# Patient Record
Sex: Female | Born: 1980 | Hispanic: No | Smoking: Former smoker
Health system: Southern US, Community
[De-identification: ages and names within clinical notes are randomized; demographics above are authoritative.]

## PROBLEM LIST (undated history)

## (undated) ENCOUNTER — Encounter: Attending: Obstetrics & Gynecology | Primary: Obstetrics & Gynecology

## (undated) ENCOUNTER — Encounter

## (undated) ENCOUNTER — Ambulatory Visit: Payer: Medicaid (Managed Care)

## (undated) ENCOUNTER — Ambulatory Visit

## (undated) ENCOUNTER — Telehealth

## (undated) ENCOUNTER — Ambulatory Visit
Payer: PRIVATE HEALTH INSURANCE | Attending: Student in an Organized Health Care Education/Training Program | Primary: Student in an Organized Health Care Education/Training Program

## (undated) ENCOUNTER — Telehealth
Attending: Student in an Organized Health Care Education/Training Program | Primary: Student in an Organized Health Care Education/Training Program

## (undated) ENCOUNTER — Encounter: Attending: Anesthesiology | Primary: Anesthesiology

## (undated) ENCOUNTER — Encounter: Attending: Dermatology | Primary: Dermatology

## (undated) ENCOUNTER — Ambulatory Visit: Payer: PRIVATE HEALTH INSURANCE

## (undated) ENCOUNTER — Encounter
Attending: Student in an Organized Health Care Education/Training Program | Primary: Student in an Organized Health Care Education/Training Program

## (undated) ENCOUNTER — Encounter: Payer: PRIVATE HEALTH INSURANCE | Attending: Dermatology | Primary: Dermatology

## (undated) ENCOUNTER — Encounter: Attending: Adult Health | Primary: Adult Health

## (undated) ENCOUNTER — Ambulatory Visit: Payer: PRIVATE HEALTH INSURANCE | Attending: Anesthesiology | Primary: Anesthesiology

## (undated) ENCOUNTER — Ambulatory Visit: Payer: PRIVATE HEALTH INSURANCE | Attending: Registered Nurse | Primary: Registered Nurse

## (undated) ENCOUNTER — Ambulatory Visit
Attending: Student in an Organized Health Care Education/Training Program | Primary: Student in an Organized Health Care Education/Training Program

## (undated) ENCOUNTER — Inpatient Hospital Stay

## (undated) ENCOUNTER — Ambulatory Visit: Payer: PRIVATE HEALTH INSURANCE | Attending: "Women's Health Care | Primary: "Women's Health Care

## (undated) ENCOUNTER — Encounter: Payer: PRIVATE HEALTH INSURANCE | Attending: Anesthesiology | Primary: Anesthesiology

## (undated) ENCOUNTER — Ambulatory Visit
Payer: PRIVATE HEALTH INSURANCE | Attending: Physical Medicine & Rehabilitation | Primary: Physical Medicine & Rehabilitation

## (undated) ENCOUNTER — Encounter: Attending: "Women's Health Care | Primary: "Women's Health Care

## (undated) ENCOUNTER — Telehealth: Attending: Dermatology | Primary: Dermatology

## (undated) ENCOUNTER — Telehealth: Attending: Obstetrics & Gynecology | Primary: Obstetrics & Gynecology

## (undated) ENCOUNTER — Encounter: Attending: Registered" | Primary: Registered"

## (undated) ENCOUNTER — Ambulatory Visit: Attending: Internal Medicine | Primary: Internal Medicine

## (undated) ENCOUNTER — Ambulatory Visit
Payer: Medicaid (Managed Care) | Attending: Student in an Organized Health Care Education/Training Program | Primary: Student in an Organized Health Care Education/Training Program

## (undated) ENCOUNTER — Ambulatory Visit: Payer: PRIVATE HEALTH INSURANCE | Attending: Clinical | Primary: Clinical

## (undated) ENCOUNTER — Encounter
Payer: PRIVATE HEALTH INSURANCE | Attending: Rehabilitative and Restorative Service Providers" | Primary: Rehabilitative and Restorative Service Providers"

## (undated) ENCOUNTER — Non-Acute Institutional Stay: Payer: PRIVATE HEALTH INSURANCE | Attending: Dermatology | Primary: Dermatology

## (undated) ENCOUNTER — Encounter: Attending: Diagnostic Radiology | Primary: Diagnostic Radiology

## (undated) ENCOUNTER — Ambulatory Visit
Attending: Rehabilitative and Restorative Service Providers" | Primary: Rehabilitative and Restorative Service Providers"

## (undated) ENCOUNTER — Encounter: Attending: Physician Assistant | Primary: Physician Assistant

## (undated) ENCOUNTER — Encounter: Attending: Hematology | Primary: Hematology

## (undated) ENCOUNTER — Ambulatory Visit: Payer: PRIVATE HEALTH INSURANCE | Attending: Obstetrics & Gynecology | Primary: Obstetrics & Gynecology

## (undated) ENCOUNTER — Encounter: Attending: Pharmacist | Primary: Pharmacist

## (undated) ENCOUNTER — Telehealth: Attending: Nephrology | Primary: Nephrology

## (undated) ENCOUNTER — Ambulatory Visit: Attending: Pharmacist | Primary: Pharmacist

## (undated) ENCOUNTER — Encounter: Payer: PRIVATE HEALTH INSURANCE | Attending: Registered" | Primary: Registered"

## (undated) DIAGNOSIS — L732 Hidradenitis suppurativa: Secondary | ICD-10-CM

## (undated) DIAGNOSIS — G9332 Myalgic encephalomyelitis/chronic fatigue syndrome: Secondary | ICD-10-CM

## (undated) DIAGNOSIS — M1990 Unspecified osteoarthritis, unspecified site: Secondary | ICD-10-CM

## (undated) DIAGNOSIS — M797 Fibromyalgia: Secondary | ICD-10-CM

## (undated) DIAGNOSIS — G4721 Circadian rhythm sleep disorder, delayed sleep phase type: Secondary | ICD-10-CM

## (undated) DIAGNOSIS — G8929 Other chronic pain: Secondary | ICD-10-CM

## (undated) DIAGNOSIS — R5382 Chronic fatigue, unspecified: Secondary | ICD-10-CM

## (undated) HISTORY — DX: Chronic fatigue, unspecified: R53.82

## (undated) HISTORY — PX: CHOLECYSTECTOMY: SHX55

## (undated) HISTORY — DX: Circadian rhythm sleep disorder, delayed sleep phase type: G47.21

## (undated) HISTORY — DX: Hidradenitis suppurativa: L73.2

## (undated) HISTORY — DX: Fibromyalgia: M79.7

## (undated) HISTORY — DX: Other chronic pain: G89.29

## (undated) HISTORY — PX: TONSILLECTOMY: SUR1361

## (undated) HISTORY — DX: Myalgic encephalomyelitis/chronic fatigue syndrome: G93.32

## (undated) MED ORDER — AZELASTINE-FLUTICASONE 137 MCG-50 MCG/SPRAY NASAL SPRAY
NASAL | 0.00000 days
Start: ? — End: 2020-12-22

## (undated) MED ORDER — TRIAMCINOLONE ACETONIDE 0.1 % TOPICAL OINTMENT
0.00000 days
Start: ? — End: 2020-12-22

## (undated) MED ORDER — MELATONIN 5 MG CAPSULE: ORAL | 0.00000 days

## (undated) MED ORDER — MULTIVITAMIN TABLET
Freq: Every day | ORAL | 0.00000 days
Start: ? — End: 2020-12-22

## (undated) MED ORDER — TRULICITY 0.75 MG/0.5 ML SUBCUTANEOUS PEN INJECTOR
0.00000 days
Start: ? — End: 2020-12-22

---
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
## 2017-09-09 DIAGNOSIS — J329 Chronic sinusitis, unspecified: Principal | ICD-10-CM

## 2017-09-16 ENCOUNTER — Ambulatory Visit
Admission: RE | Admit: 2017-09-16 | Discharge: 2017-09-16 | Disposition: A | Discharge status: Home / Self Care | Payer: MEDICAID | Attending: Otolaryngology

## 2017-09-16 ENCOUNTER — Ambulatory Visit
Admission: RE | Admit: 2017-09-16 | Discharge: 2017-09-16 | Disposition: A | Discharge status: Home / Self Care | Payer: MEDICAID | Attending: Anesthesiology | Admitting: Anesthesiology

## 2017-09-16 DIAGNOSIS — J329 Chronic sinusitis, unspecified: Principal | ICD-10-CM

## 2018-06-01 ENCOUNTER — Ambulatory Visit
Admit: 2018-06-01 | Discharge: 2018-06-02 | Payer: PRIVATE HEALTH INSURANCE | Attending: Anesthesiology | Primary: Anesthesiology

## 2018-06-01 DIAGNOSIS — M5442 Lumbago with sciatica, left side: Secondary | ICD-10-CM

## 2018-06-01 DIAGNOSIS — G8929 Other chronic pain: Secondary | ICD-10-CM

## 2018-06-01 DIAGNOSIS — M5441 Lumbago with sciatica, right side: Principal | ICD-10-CM

## 2018-06-01 MED ORDER — CELECOXIB 200 MG CAPSULE
ORAL_CAPSULE | Freq: Two times a day (BID) | ORAL | 1 refills | 0 days | Status: CP
Start: 2018-06-01 — End: 2018-07-06

## 2018-06-01 MED ORDER — PREGABALIN 50 MG CAPSULE
ORAL_CAPSULE | Freq: Two times a day (BID) | ORAL | 1 refills | 0 days | Status: CP
Start: 2018-06-01 — End: 2018-09-26

## 2018-07-06 ENCOUNTER — Encounter
Admit: 2018-07-06 | Discharge: 2018-07-07 | Payer: PRIVATE HEALTH INSURANCE | Attending: Anesthesiology | Primary: Anesthesiology

## 2018-07-06 DIAGNOSIS — M5442 Lumbago with sciatica, left side: Secondary | ICD-10-CM

## 2018-07-06 DIAGNOSIS — M7918 Myalgia, other site: Secondary | ICD-10-CM

## 2018-07-06 DIAGNOSIS — G8929 Other chronic pain: Secondary | ICD-10-CM

## 2018-07-06 DIAGNOSIS — M5441 Lumbago with sciatica, right side: Principal | ICD-10-CM

## 2018-07-06 MED ORDER — CELECOXIB 200 MG CAPSULE
ORAL_CAPSULE | Freq: Two times a day (BID) | ORAL | 2 refills | 0.00000 days | Status: CP
Start: 2018-07-06 — End: 2018-08-17

## 2018-08-16 ENCOUNTER — Encounter
Admit: 2018-08-16 | Discharge: 2018-08-16 | Payer: PRIVATE HEALTH INSURANCE | Attending: Physical Medicine & Rehabilitation | Primary: Physical Medicine & Rehabilitation

## 2018-08-16 ENCOUNTER — Encounter
Admit: 2018-08-16 | Discharge: 2018-08-16 | Payer: PRIVATE HEALTH INSURANCE | Attending: Registered" | Primary: Registered"

## 2018-08-16 DIAGNOSIS — M5442 Lumbago with sciatica, left side: Secondary | ICD-10-CM

## 2018-08-16 DIAGNOSIS — Z6841 Body Mass Index (BMI) 40.0 and over, adult: Secondary | ICD-10-CM

## 2018-08-16 DIAGNOSIS — M5441 Lumbago with sciatica, right side: Principal | ICD-10-CM

## 2018-08-16 DIAGNOSIS — G8929 Other chronic pain: Secondary | ICD-10-CM

## 2018-08-16 DIAGNOSIS — R7303 Prediabetes: Secondary | ICD-10-CM

## 2018-08-16 MED ORDER — METFORMIN 500 MG TABLET
ORAL_TABLET | Freq: Two times a day (BID) | ORAL | 1 refills | 0.00000 days | Status: CP
Start: 2018-08-16 — End: 2018-12-21

## 2018-08-17 ENCOUNTER — Encounter
Admit: 2018-08-17 | Discharge: 2018-08-18 | Payer: PRIVATE HEALTH INSURANCE | Attending: Anesthesiology | Primary: Anesthesiology

## 2018-08-17 DIAGNOSIS — M5442 Lumbago with sciatica, left side: Secondary | ICD-10-CM

## 2018-08-17 DIAGNOSIS — M5441 Lumbago with sciatica, right side: Principal | ICD-10-CM

## 2018-08-17 DIAGNOSIS — G8929 Other chronic pain: Secondary | ICD-10-CM

## 2018-08-17 MED ORDER — CELECOXIB 200 MG CAPSULE
ORAL_CAPSULE | Freq: Two times a day (BID) | ORAL | 2 refills | 0.00000 days | Status: CP
Start: 2018-08-17 — End: 2018-09-26

## 2018-09-13 ENCOUNTER — Encounter: Admit: 2018-09-13 | Discharge: 2018-09-14 | Payer: PRIVATE HEALTH INSURANCE

## 2018-09-13 DIAGNOSIS — M5441 Lumbago with sciatica, right side: Principal | ICD-10-CM

## 2018-09-13 DIAGNOSIS — M5442 Lumbago with sciatica, left side: Secondary | ICD-10-CM

## 2018-09-13 DIAGNOSIS — G8929 Other chronic pain: Secondary | ICD-10-CM

## 2018-09-19 ENCOUNTER — Emergency Department
Admission: EM | Admit: 2018-09-19 | Discharge: 2018-09-19 | Disposition: A | Discharge status: Home / Self Care | Payer: Medicaid Other | Attending: Emergency Medicine | Admitting: Emergency Medicine

## 2018-09-19 ENCOUNTER — Encounter: Payer: Self-pay | Admitting: Emergency Medicine

## 2018-09-19 ENCOUNTER — Other Ambulatory Visit: Payer: Self-pay

## 2018-09-19 ENCOUNTER — Emergency Department: Payer: Medicaid Other

## 2018-09-19 DIAGNOSIS — S40012A Contusion of left shoulder, initial encounter: Secondary | ICD-10-CM | POA: Diagnosis not present

## 2018-09-19 DIAGNOSIS — Y999 Unspecified external cause status: Secondary | ICD-10-CM | POA: Diagnosis not present

## 2018-09-19 DIAGNOSIS — Y92410 Unspecified street and highway as the place of occurrence of the external cause: Secondary | ICD-10-CM | POA: Diagnosis not present

## 2018-09-19 DIAGNOSIS — S4992XA Unspecified injury of left shoulder and upper arm, initial encounter: Secondary | ICD-10-CM | POA: Diagnosis present

## 2018-09-19 DIAGNOSIS — Y939 Activity, unspecified: Secondary | ICD-10-CM | POA: Insufficient documentation

## 2018-09-19 MED ORDER — CYCLOBENZAPRINE HCL 10 MG PO TABS: 10.0000 mg | ORAL_TABLET | Freq: Three times a day (TID) | ORAL | 0 refills | Status: AC | PRN

## 2018-09-19 MED ORDER — NAPROXEN 500 MG PO TABS: 500.0000 mg | ORAL_TABLET | Freq: Two times a day (BID) | ORAL | 0 refills | Status: AC
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
## 2018-09-19 NOTE — Discharge Instructions (Signed)
Follow-up with the primary care provider for symptoms are not improving over the week  Return to the emergency department for symptoms of change or worsen if you are unable to schedule an appointment.
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
## 2018-09-26 ENCOUNTER — Encounter
Admit: 2018-09-26 | Discharge: 2018-09-27 | Payer: PRIVATE HEALTH INSURANCE | Attending: Anesthesiology | Primary: Anesthesiology

## 2018-09-26 DIAGNOSIS — M5442 Lumbago with sciatica, left side: Secondary | ICD-10-CM

## 2018-09-26 DIAGNOSIS — M5441 Lumbago with sciatica, right side: Principal | ICD-10-CM

## 2018-09-26 DIAGNOSIS — G8929 Other chronic pain: Secondary | ICD-10-CM

## 2018-09-26 MED ORDER — PREGABALIN 25 MG CAPSULE
ORAL_CAPSULE | Freq: Three times a day (TID) | ORAL | 2 refills | 0.00000 days | Status: CP
Start: 2018-09-26 — End: 2019-03-04

## 2018-09-26 MED ORDER — CELECOXIB 200 MG CAPSULE
ORAL_CAPSULE | Freq: Two times a day (BID) | ORAL | 2 refills | 0.00000 days | Status: CP
Start: 2018-09-26 — End: 2018-11-06

## 2018-10-19 ENCOUNTER — Emergency Department
Admission: EM | Admit: 2018-10-19 | Discharge: 2018-10-19 | Disposition: A | Discharge status: Home / Self Care | Payer: Medicaid Other | Attending: Emergency Medicine | Admitting: Emergency Medicine

## 2018-10-19 ENCOUNTER — Other Ambulatory Visit: Payer: Self-pay

## 2018-10-19 ENCOUNTER — Encounter: Payer: Self-pay | Admitting: Emergency Medicine

## 2018-10-19 DIAGNOSIS — R070 Pain in throat: Secondary | ICD-10-CM | POA: Diagnosis not present

## 2018-10-19 DIAGNOSIS — R0981 Nasal congestion: Secondary | ICD-10-CM | POA: Insufficient documentation

## 2018-10-19 DIAGNOSIS — J069 Acute upper respiratory infection, unspecified: Secondary | ICD-10-CM | POA: Diagnosis not present

## 2018-10-19 DIAGNOSIS — F17200 Nicotine dependence, unspecified, uncomplicated: Secondary | ICD-10-CM | POA: Insufficient documentation

## 2018-10-19 DIAGNOSIS — R05 Cough: Secondary | ICD-10-CM | POA: Diagnosis present

## 2018-10-19 HISTORY — DX: Unspecified osteoarthritis, unspecified site: M19.90

## 2018-10-19 MED ORDER — OXYMETAZOLINE HCL 0.05 % NA SOLN
1.0000 | Freq: Once | NASAL | Status: DC
  Filled 2018-10-19: qty 15

## 2018-10-19 MED ORDER — PREDNISONE 20 MG PO TABS: 20.0000 mg | ORAL_TABLET | Freq: Every day | ORAL | 0 refills | Status: AC

## 2018-10-19 MED ORDER — BENZONATATE 100 MG PO CAPS: 100.0000 mg | ORAL_CAPSULE | Freq: Four times a day (QID) | ORAL | 0 refills | Status: AC | PRN

## 2018-10-19 MED ORDER — IPRATROPIUM BROMIDE 0.06 % NA SOLN: 2.0000 | Freq: Three times a day (TID) | NASAL | 0 refills | Status: AC

## 2018-10-19 MED ORDER — PREDNISONE 20 MG PO TABS: 20.0000 mg | ORAL_TABLET | Freq: Once | ORAL | Status: DC

## 2018-10-19 MED ORDER — PSEUDOEPHEDRINE HCL 30 MG PO TABS
60.0000 mg | ORAL_TABLET | Freq: Once | ORAL | Status: DC
  Filled 2018-10-19: qty 2

## 2018-10-19 MED ORDER — BENZONATATE 100 MG PO CAPS
200.0000 mg | ORAL_CAPSULE | Freq: Once | ORAL | Status: AC
  Administered 2018-10-19: 200 mg via ORAL
  Filled 2018-10-19: qty 2

## 2018-10-19 MED ORDER — IBUPROFEN 600 MG PO TABS
600.0000 mg | ORAL_TABLET | Freq: Once | ORAL | Status: DC
  Filled 2018-10-19: qty 1

## 2018-10-19 NOTE — ED Provider Notes (Signed)
[facility] Emergency Department Provider Note  ____________________________________________   First MD Initiated Contact with Patient 10/19/18 [id number]     (approximate)  I have reviewed the triage vital signs and the nursing notes.   HISTORY  Chief Complaint Sore Throat and Cough   HPI Jenny Diaz is a 37 y.o. female presents to the emergency department with 2 days of insidious onset slowly progressive now moderate severity dry cough, sore throat, and nasal congestion.  Denies fevers or chills.  No shortness of breath.  She has no history of asthma or COPD.  She does report some fullness in both of her ears.    Past Medical History:  Diagnosis Date  . Arthritis     There are no active problems to display for this patient.   Past Surgical History:  Procedure Laterality Date  . CHOLECYSTECTOMY    . TONSILLECTOMY      Prior to Admission medications   Medication Sig Start Date End Date Taking? Authorizing Provider  benzonatate (TESSALON PERLES) 100 MG capsule Take 1 capsule (100 mg total) by mouth every 6 (six) hours as needed for cough. 10/19/18 10/19/19  Merrily Brittleifenbark, Lilli Dewald, MD  cyclobenzaprine (FLEXERIL) 10 MG tablet Take 1 tablet (10 mg total) by mouth 3 (three) times daily as needed for muscle spasms. 09/19/18   Triplett, Cari B, FNP  ipratropium (ATROVENT) 0.06 % nasal spray Place 2 sprays into the nose 3 (three) times daily. 10/19/18 10/19/19  Merrily Brittleifenbark, Tarance Balan, MD  naproxen (NAPROSYN) 500 MG tablet Take 1 tablet (500 mg total) by mouth 2 (two) times daily with a meal. 09/19/18   Triplett, Cari B, FNP  predniSONE (DELTASONE) 20 MG tablet Take 1 tablet (20 mg total) by mouth daily for 4 days. 10/19/18 10/23/18  Merrily Brittleifenbark, Marissa Lowrey, MD    Allergies Ceclor [cefaclor] and Amoxicillin-pot clavulanate  No family history on file.  Social History Social History   Tobacco Use  . Smoking status: Current Some Day Smoker  . Smokeless tobacco: Never Used    Substance Use Topics  . Alcohol use: Yes  . Drug use: Never    Review of Systems Constitutional: No fever/chills ENT: Positive for sore throat. Cardiovascular: Denies chest pain. Respiratory: Positive for cough Gastrointestinal: No abdominal pain.  No nausea, no vomiting.  No diarrhea.  No constipation. Musculoskeletal: Negative for back pain. Neurological: Observe for headache   ____________________________________________   PHYSICAL EXAM:  VITAL SIGNS: ED Triage Vitals  Enc Vitals Group     BP 10/19/18 0412 137/90     Pulse Rate 10/19/18 0412 78     Resp 10/19/18 0412 16     Temp 10/19/18 0412 97.8 F (36.6 C)     Temp Source 10/19/18 0412 Oral     SpO2 10/19/18 0412 100 %     Weight 10/19/18 0406 (!) 345 lb (156.5 kg)     Height 10/19/18 0406 5\' 6"  (1.676 m)     Head Circumference --      Peak Flow --      Pain Score 10/19/18 0405 7     Pain Loc --      Pain Edu? --      Excl. in GC? --     Constitutional: Alert and oriented x4 dry cough and clearing her throat frequently during exam Head: Atraumatic.  Some fluid behind both of her ears although no erythema no bulging Nose: Quite congested Mouth/Throat: No trismus uvula midline no pharyngeal erythema or exudate.  No lymphadenopathy  appreciated Neck: No stridor.   Cardiovascular: Regular rate and rhythm Respiratory: Slightly increased respiratory effort although lungs are clear bilaterally and moving good air Gastrointestinal: Soft nontender Neurologic:  Normal speech and language. No gross focal neurologic deficits are appreciated.  Skin:  Skin is warm, dry and intact. No rash noted.    ____________________________________________  LABS (all labs ordered are listed, but only abnormal results are displayed)  Labs Reviewed - No data to  display   __________________________________________  EKG   ____________________________________________  RADIOLOGY   ____________________________________________   DIFFERENTIAL includes but not limited to  Upper respiratory tract infection, influenza, pneumonia, strep pharyngitis, sinusitis   PROCEDURES  Procedure(s) performed: no  Procedures  Critical Care performed: no  ____________________________________________   INITIAL IMPRESSION / ASSESSMENT AND PLAN / ED COURSE  Pertinent labs & imaging results that were available during my care of the patient were reviewed by me and considered in my medical decision making (see chart for details).   As part of my medical decision making, I reviewed the following data within the electronic MEDICAL RECORD NUMBER History obtained from family if available, nursing notes, old chart and ekg, as well as notes from prior ED visits.  The patient has a dry cough along with significant congestion and fluid behind both of her ears.  Here in the emergency department she was given Afrin, ibuprofen, Tylenol, and a nebulized lidocaine with significant improvement of her symptoms.  I will discharge her home with Atrovent nasal spray as well as Tessalon Perles and a short course of prednisone at the patient's request.  Strict return precautions have been given.      ____________________________________________   FINAL CLINICAL IMPRESSION(S) / ED DIAGNOSES  Final diagnoses:  Upper respiratory tract infection, unspecified type  Sinus congestion      NEW MEDICATIONS STARTED DURING THIS VISIT:  Discharge Medication List as of 10/19/2018  4:29 AM    START taking these medications   Details  benzonatate (TESSALON PERLES) 100 MG capsule Take 1 capsule (100 mg total) by mouth every 6 (six) hours as needed for cough., Starting Fri 10/19/2018, Until Sat 10/19/2019, Print    ipratropium (ATROVENT) 0.06 % nasal spray Place 2 sprays into the  nose 3 (three) times daily., Starting Fri 10/19/2018, Until Sat 10/19/2019, Print         Note:  This document was prepared using Dragon voice recognition software and may include unintentional dictation errors.      Merrily Brittle, MD 10/21/18 1007

## 2018-10-19 NOTE — Discharge Instructions (Signed)
It is normal for your head cold to last up to a full 4-5 days.  Please use over the counter sinus rinses (like a neti pot) to help flush out all of your mucous.  Use your cough medication as needed for severe symptoms and follow up with your PMD as needed.  Fortunately today your lungs were very normal and you do not have bronchitis or pneumonia.  It was a pleasure to take care of you today, and thank you for coming to our emergency department.  If you have any questions or concerns before leaving please ask the nurse to grab me and I'm more than happy to go through your aftercare instructions again.  If you have any concerns once you are home that you are not improving or are in fact getting worse before you can make it to your follow-up appointment, please do not hesitate to call 911 and come back for further evaluation.  Merrily BrittleNeil Magaby Rumberger, MD

## 2018-10-19 NOTE — ED Notes (Signed)
Pt refusing all meds, states she wants prednisone and that they usually give her steroids when " I can't breathe good". PT in NAD , RR even and unlabored, speaking in full sentences.

## 2018-10-19 NOTE — ED Triage Notes (Signed)
Pt with upper respiratory symptoms, cough, sore throat x 2 days. Denies fever. Clearing throat often in triage.

## 2018-11-22 ENCOUNTER — Encounter
Admit: 2018-11-22 | Discharge: 2018-11-23 | Payer: PRIVATE HEALTH INSURANCE | Attending: Nurse Practitioner | Primary: Nurse Practitioner

## 2018-11-22 ENCOUNTER — Ambulatory Visit
Admission: EM | Admit: 2018-11-22 | Discharge: 2018-11-22 | Discharge status: Against Medical Advice | Payer: Medicaid Other

## 2018-11-22 DIAGNOSIS — H9201 Otalgia, right ear: Principal | ICD-10-CM

## 2018-12-21 ENCOUNTER — Ambulatory Visit: Admit: 2018-12-21 | Discharge: 2018-12-22 | Payer: PRIVATE HEALTH INSURANCE

## 2018-12-21 DIAGNOSIS — J309 Allergic rhinitis, unspecified: Principal | ICD-10-CM

## 2018-12-21 DIAGNOSIS — R7303 Prediabetes: Secondary | ICD-10-CM

## 2018-12-21 DIAGNOSIS — G894 Chronic pain syndrome: Secondary | ICD-10-CM

## 2018-12-21 DIAGNOSIS — E559 Vitamin D deficiency, unspecified: Secondary | ICD-10-CM

## 2018-12-21 DIAGNOSIS — D72829 Elevated white blood cell count, unspecified: Secondary | ICD-10-CM

## 2018-12-21 DIAGNOSIS — J4520 Mild intermittent asthma, uncomplicated: Secondary | ICD-10-CM

## 2018-12-21 DIAGNOSIS — F419 Anxiety disorder, unspecified: Secondary | ICD-10-CM

## 2018-12-27 ENCOUNTER — Encounter: Admit: 2018-12-27 | Discharge: 2018-12-28 | Payer: PRIVATE HEALTH INSURANCE

## 2018-12-27 DIAGNOSIS — D72829 Elevated white blood cell count, unspecified: Principal | ICD-10-CM

## 2018-12-27 DIAGNOSIS — E559 Vitamin D deficiency, unspecified: Secondary | ICD-10-CM

## 2018-12-27 DIAGNOSIS — R7303 Prediabetes: Secondary | ICD-10-CM

## 2019-03-04 ENCOUNTER — Encounter
Admit: 2019-03-04 | Discharge: 2019-03-05 | Payer: PRIVATE HEALTH INSURANCE | Attending: Anesthesiology | Primary: Anesthesiology

## 2019-03-04 DIAGNOSIS — M5441 Lumbago with sciatica, right side: Principal | ICD-10-CM

## 2019-03-04 DIAGNOSIS — M5442 Lumbago with sciatica, left side: Secondary | ICD-10-CM

## 2019-03-04 DIAGNOSIS — M7918 Myalgia, other site: Secondary | ICD-10-CM

## 2019-03-04 DIAGNOSIS — G8929 Other chronic pain: Secondary | ICD-10-CM

## 2019-03-04 MED ORDER — GABAPENTIN 100 MG CAPSULE
ORAL_CAPSULE | Freq: Every day | ORAL | 0 refills | 0 days | Status: CP
Start: 2019-03-04 — End: 2019-04-17

## 2019-03-04 MED ORDER — BACLOFEN 10 MG TABLET
ORAL_TABLET | Freq: Every day | ORAL | 0 refills | 0.00000 days | Status: CP | PRN
Start: 2019-03-04 — End: 2019-04-03

## 2019-03-07 MED ORDER — PERMETHRIN 5 % TOPICAL CREAM
Freq: Once | TOPICAL | 0 refills | 0 days | Status: CP
Start: 2019-03-07 — End: 2019-03-07

## 2019-03-13 MED ORDER — SPINOSAD 0.9 % TOPICAL SUSPENSION
1 refills | 0 days | Status: CP
Start: 2019-03-13 — End: 2019-04-11

## 2019-03-27 ENCOUNTER — Encounter
Admit: 2019-03-27 | Discharge: 2019-03-28 | Payer: PRIVATE HEALTH INSURANCE | Attending: Dermatology | Primary: Dermatology

## 2019-03-27 DIAGNOSIS — R21 Rash and other nonspecific skin eruption: Principal | ICD-10-CM

## 2019-03-27 DIAGNOSIS — L732 Hidradenitis suppurativa: Secondary | ICD-10-CM

## 2019-03-27 DIAGNOSIS — L209 Atopic dermatitis, unspecified: Secondary | ICD-10-CM

## 2019-03-27 MED ORDER — CLINDAMYCIN 1 % LOTION
5 refills | 0.00000 days | Status: CP
Start: 2019-03-27 — End: 2019-03-28

## 2019-03-27 MED ORDER — DOXYCYCLINE MONOHYDRATE 100 MG TABLET
ORAL_TABLET | 1 refills | 0.00000 days | Status: CP
Start: 2019-03-27 — End: 2019-03-28

## 2019-03-27 MED ORDER — TRIAMCINOLONE ACETONIDE 0.1 % TOPICAL CREAM
INTRAMUSCULAR | 2 refills | 0.00000 days | Status: CP
Start: 2019-03-27 — End: ?

## 2019-03-28 MED ORDER — DOXYCYCLINE MONOHYDRATE 100 MG TABLET
ORAL_TABLET | Freq: Every day | ORAL | 2 refills | 0.00000 days | Status: CP
Start: 2019-03-28 — End: ?

## 2019-03-28 MED ORDER — CLINDAMYCIN PHOSPHATE 1 % TOPICAL SOLUTION
Freq: Every morning | TOPICAL | 5 refills | 0.00000 days | Status: CP
Start: 2019-03-28 — End: 2020-03-27

## 2019-03-28 MED ORDER — CELECOXIB 200 MG CAPSULE
ORAL_CAPSULE | Freq: Two times a day (BID) | ORAL | 1 refills | 0.00000 days | Status: CP
Start: 2019-03-28 — End: 2019-04-27
  Filled 2019-06-15: qty 60, 30d supply, fill #0

## 2019-04-12 MED ORDER — SPINOSAD 0.9 % TOPICAL SUSPENSION
1 refills | 0 days | Status: CP
Start: 2019-04-12 — End: 2020-04-10

## 2019-04-17 ENCOUNTER — Encounter
Admit: 2019-04-17 | Discharge: 2019-04-18 | Payer: PRIVATE HEALTH INSURANCE | Attending: Anesthesiology | Primary: Anesthesiology

## 2019-04-17 DIAGNOSIS — G894 Chronic pain syndrome: Secondary | ICD-10-CM

## 2019-04-17 DIAGNOSIS — M797 Fibromyalgia: Principal | ICD-10-CM

## 2019-04-17 DIAGNOSIS — M545 Low back pain: Secondary | ICD-10-CM

## 2019-04-17 MED ORDER — DULOXETINE 30 MG CAPSULE,DELAYED RELEASE
ORAL_CAPSULE | Freq: Every day | ORAL | 0 refills | 0 days | Status: CP
Start: 2019-04-17 — End: 2019-05-21

## 2019-05-21 MED ORDER — DULOXETINE 20 MG CAPSULE,DELAYED RELEASE
ORAL_CAPSULE | Freq: Every day | ORAL | 1 refills | 0 days | Status: CP
Start: 2019-05-21 — End: 2019-06-10

## 2019-06-10 MED ORDER — DULOXETINE 20 MG CAPSULE,DELAYED RELEASE
ORAL_CAPSULE | Freq: Every day | ORAL | 1 refills | 30 days | Status: CP
Start: 2019-06-10 — End: 2019-06-19
  Filled 2019-06-10: qty 30, 30d supply, fill #0

## 2019-06-10 MED FILL — DULOXETINE 20 MG CAPSULE,DELAYED RELEASE: 30 days supply | Qty: 30 | Fill #0 | Status: AC

## 2019-06-12 MED ORDER — CELECOXIB 200 MG CAPSULE: 200 mg | capsule | Freq: Two times a day (BID) | 1 refills | 30 days | Status: AC

## 2019-06-15 MED FILL — CELECOXIB 200 MG CAPSULE: 30 days supply | Qty: 60 | Fill #0 | Status: AC

## 2019-06-19 ENCOUNTER — Encounter
Admit: 2019-06-19 | Discharge: 2019-06-20 | Payer: PRIVATE HEALTH INSURANCE | Attending: Anesthesiology | Primary: Anesthesiology

## 2019-06-19 DIAGNOSIS — G894 Chronic pain syndrome: Secondary | ICD-10-CM

## 2019-06-19 DIAGNOSIS — M797 Fibromyalgia: Principal | ICD-10-CM

## 2019-06-19 MED ORDER — CELECOXIB 200 MG CAPSULE
ORAL_CAPSULE | Freq: Two times a day (BID) | ORAL | 1 refills | 30.00000 days | Status: CP
Start: 2019-06-19 — End: 2019-07-09

## 2019-06-19 MED ORDER — DULOXETINE 20 MG CAPSULE,DELAYED RELEASE
ORAL_CAPSULE | Freq: Every day | ORAL | 1 refills | 30 days | Status: CP
Start: 2019-06-19 — End: 2019-07-09

## 2019-07-09 MED ORDER — CELECOXIB 200 MG CAPSULE
ORAL_CAPSULE | Freq: Two times a day (BID) | ORAL | 1 refills | 30 days | Status: CP
Start: 2019-07-09 — End: ?

## 2019-07-09 MED ORDER — DULOXETINE 20 MG CAPSULE,DELAYED RELEASE
ORAL_CAPSULE | Freq: Every day | ORAL | 1 refills | 30.00000 days | Status: CP
Start: 2019-07-09 — End: ?

## 2019-07-31 DIAGNOSIS — M797 Fibromyalgia: Secondary | ICD-10-CM

## 2019-07-31 MED ORDER — DULOXETINE 30 MG CAPSULE,DELAYED RELEASE
ORAL_CAPSULE | Freq: Every day | ORAL | 1 refills | 90.00000 days | Status: CP
Start: 2019-07-31 — End: 2019-08-30

## 2019-08-20 MED ORDER — CELECOXIB 200 MG CAPSULE
ORAL_CAPSULE | Freq: Two times a day (BID) | ORAL | 2 refills | 30.00000 days | Status: CP
Start: 2019-08-20 — End: ?

## 2019-08-29 ENCOUNTER — Encounter
Admit: 2019-08-29 | Discharge: 2019-08-30 | Payer: PRIVATE HEALTH INSURANCE | Attending: Dermatology | Primary: Dermatology

## 2019-08-29 DIAGNOSIS — L209 Atopic dermatitis, unspecified: Secondary | ICD-10-CM

## 2019-08-29 DIAGNOSIS — L732 Hidradenitis suppurativa: Secondary | ICD-10-CM

## 2019-08-29 MED ORDER — SPIRONOLACTONE 50 MG TABLET
ORAL_TABLET | Freq: Every day | ORAL | 4 refills | 30.00000 days | Status: CP
Start: 2019-08-29 — End: 2020-08-28

## 2019-09-10 DIAGNOSIS — L732 Hidradenitis suppurativa: Principal | ICD-10-CM

## 2019-09-10 MED ORDER — DOXYCYCLINE MONOHYDRATE 100 MG TABLET: 100 mg | tablet | Freq: Every day | 0 refills | 30 days | Status: AC

## 2019-10-08 ENCOUNTER — Encounter: Admit: 2019-10-08 | Discharge: 2019-10-09 | Payer: PRIVATE HEALTH INSURANCE

## 2019-10-08 DIAGNOSIS — J3489 Other specified disorders of nose and nasal sinuses: Principal | ICD-10-CM

## 2019-10-08 MED ORDER — MUPIROCIN CALCIUM 2 % NASAL OINTMENT
Freq: Two times a day (BID) | NASAL | 0 refills | 1 days | Status: CP
Start: 2019-10-08 — End: 2019-10-13

## 2019-10-09 MED ORDER — MUPIROCIN 2 % TOPICAL OINTMENT
0 refills | 0 days | Status: CP
Start: 2019-10-09 — End: ?

## 2019-10-15 ENCOUNTER — Encounter
Admit: 2019-10-15 | Discharge: 2019-10-16 | Payer: PRIVATE HEALTH INSURANCE | Attending: Registered" | Primary: Registered"

## 2019-10-31 ENCOUNTER — Encounter
Admit: 2019-10-31 | Discharge: 2019-11-01 | Payer: PRIVATE HEALTH INSURANCE | Attending: Dermatology | Primary: Dermatology

## 2019-10-31 DIAGNOSIS — L732 Hidradenitis suppurativa: Principal | ICD-10-CM

## 2019-11-04 ENCOUNTER — Encounter: Admit: 2019-11-04 | Discharge: 2019-11-05 | Payer: PRIVATE HEALTH INSURANCE | Attending: Family | Primary: Family

## 2019-11-04 DIAGNOSIS — J3489 Other specified disorders of nose and nasal sinuses: Principal | ICD-10-CM

## 2019-11-04 DIAGNOSIS — D72829 Elevated white blood cell count, unspecified: Principal | ICD-10-CM

## 2019-11-04 DIAGNOSIS — M2550 Pain in unspecified joint: Principal | ICD-10-CM

## 2019-11-05 ENCOUNTER — Encounter: Admit: 2019-11-05 | Discharge: 2019-11-06 | Payer: PRIVATE HEALTH INSURANCE

## 2019-11-05 MED ORDER — CELECOXIB 200 MG CAPSULE
ORAL_CAPSULE | Freq: Two times a day (BID) | ORAL | 2 refills | 30 days | Status: CP
Start: 2019-11-05 — End: ?

## 2019-11-05 MED ORDER — DULOXETINE 40 MG CAPSULE,DELAYED RELEASE
ORAL_CAPSULE | Freq: Every day | ORAL | 2 refills | 30 days | Status: CP
Start: 2019-11-05 — End: 2020-11-04

## 2019-11-11 ENCOUNTER — Ambulatory Visit: Admit: 2019-11-11 | Discharge: 2019-11-12 | Payer: PRIVATE HEALTH INSURANCE

## 2019-11-30 DIAGNOSIS — L732 Hidradenitis suppurativa: Principal | ICD-10-CM

## 2019-12-03 MED ORDER — DOXYCYCLINE HYCLATE 100 MG CAPSULE
ORAL_CAPSULE | 0 refills | 0.00000 days | Status: CP
Start: 2019-12-03 — End: ?

## 2020-01-02 ENCOUNTER — Encounter
Admit: 2020-01-02 | Discharge: 2020-01-03 | Payer: PRIVATE HEALTH INSURANCE | Attending: Registered" | Primary: Registered"

## 2020-01-02 ENCOUNTER — Encounter: Admit: 2020-01-02 | Discharge: 2020-01-03 | Payer: PRIVATE HEALTH INSURANCE

## 2020-01-02 DIAGNOSIS — L732 Hidradenitis suppurativa: Principal | ICD-10-CM

## 2020-01-03 ENCOUNTER — Encounter
Admit: 2020-01-03 | Discharge: 2020-01-04 | Payer: PRIVATE HEALTH INSURANCE | Attending: Anesthesiology | Primary: Anesthesiology

## 2020-01-03 DIAGNOSIS — G894 Chronic pain syndrome: Principal | ICD-10-CM

## 2020-01-03 DIAGNOSIS — M7062 Trochanteric bursitis, left hip: Principal | ICD-10-CM

## 2020-01-03 DIAGNOSIS — M5416 Radiculopathy, lumbar region: Principal | ICD-10-CM

## 2020-01-03 DIAGNOSIS — M797 Fibromyalgia: Principal | ICD-10-CM

## 2020-01-03 MED ORDER — DULOXETINE 40 MG CAPSULE,DELAYED RELEASE
ORAL_CAPSULE | Freq: Every day | ORAL | 2 refills | 30 days | Status: CP
Start: 2020-01-03 — End: 2021-01-02

## 2020-01-03 MED ORDER — SPIRONOLACTONE 50 MG TABLET
ORAL_TABLET | 1 refills | 0.00000 days | Status: CP
Start: 2020-01-03 — End: ?

## 2020-01-03 MED ORDER — GABAPENTIN 100 MG CAPSULE
ORAL_CAPSULE | Freq: Every evening | ORAL | 2 refills | 30 days | Status: CP
Start: 2020-01-03 — End: 2020-04-02

## 2020-01-03 MED ORDER — CELECOXIB 200 MG CAPSULE
ORAL_CAPSULE | Freq: Two times a day (BID) | ORAL | 2 refills | 30.00000 days | Status: CP
Start: 2020-01-03 — End: ?

## 2020-01-08 ENCOUNTER — Encounter: Admit: 2020-01-08 | Discharge: 2020-01-09 | Payer: PRIVATE HEALTH INSURANCE

## 2020-01-08 DIAGNOSIS — J3489 Other specified disorders of nose and nasal sinuses: Principal | ICD-10-CM

## 2020-01-08 DIAGNOSIS — M2550 Pain in unspecified joint: Principal | ICD-10-CM

## 2020-01-08 DIAGNOSIS — D72829 Elevated white blood cell count, unspecified: Principal | ICD-10-CM

## 2020-01-13 ENCOUNTER — Encounter: Admit: 2020-01-13 | Discharge: 2020-01-14 | Payer: PRIVATE HEALTH INSURANCE

## 2020-01-13 DIAGNOSIS — M7918 Myalgia, other site: Principal | ICD-10-CM

## 2020-01-13 DIAGNOSIS — G894 Chronic pain syndrome: Principal | ICD-10-CM

## 2020-01-13 DIAGNOSIS — M545 Low back pain: Principal | ICD-10-CM

## 2020-01-13 DIAGNOSIS — M797 Fibromyalgia: Principal | ICD-10-CM

## 2020-01-13 MED ORDER — DULOXETINE 60 MG CAPSULE,DELAYED RELEASE
ORAL_CAPSULE | Freq: Every day | ORAL | 2 refills | 30 days | Status: CP
Start: 2020-01-13 — End: 2020-04-12

## 2020-01-15 ENCOUNTER — Encounter
Admit: 2020-01-15 | Discharge: 2020-01-15 | Payer: PRIVATE HEALTH INSURANCE | Attending: Dermatology | Primary: Dermatology

## 2020-01-15 DIAGNOSIS — L732 Hidradenitis suppurativa: Principal | ICD-10-CM

## 2020-01-23 ENCOUNTER — Encounter
Admit: 2020-01-23 | Discharge: 2020-01-24 | Payer: PRIVATE HEALTH INSURANCE | Attending: Physician Assistant | Primary: Physician Assistant

## 2020-01-23 DIAGNOSIS — T7840XS Allergy, unspecified, sequela: Principal | ICD-10-CM

## 2020-01-29 ENCOUNTER — Encounter: Admit: 2020-01-29 | Discharge: 2020-01-30 | Payer: PRIVATE HEALTH INSURANCE

## 2020-02-03 ENCOUNTER — Encounter
Admit: 2020-02-03 | Discharge: 2020-02-04 | Payer: PRIVATE HEALTH INSURANCE | Attending: Physician Assistant | Primary: Physician Assistant

## 2020-02-05 ENCOUNTER — Encounter: Admit: 2020-02-05 | Discharge: 2020-02-06 | Payer: PRIVATE HEALTH INSURANCE

## 2020-02-05 DIAGNOSIS — Z6841 Body Mass Index (BMI) 40.0 and over, adult: Principal | ICD-10-CM

## 2020-02-05 DIAGNOSIS — M2550 Pain in unspecified joint: Principal | ICD-10-CM

## 2020-02-14 DIAGNOSIS — M2550 Pain in unspecified joint: Principal | ICD-10-CM

## 2020-02-24 ENCOUNTER — Encounter
Admit: 2020-02-24 | Discharge: 2020-02-25 | Payer: PRIVATE HEALTH INSURANCE | Attending: "Women's Health Care | Primary: "Women's Health Care

## 2020-02-24 DIAGNOSIS — T8332XA Displacement of intrauterine contraceptive device, initial encounter: Principal | ICD-10-CM

## 2020-02-24 DIAGNOSIS — R1031 Right lower quadrant pain: Principal | ICD-10-CM

## 2020-03-04 ENCOUNTER — Encounter: Admit: 2020-03-04 | Discharge: 2020-03-05 | Payer: PRIVATE HEALTH INSURANCE

## 2020-03-10 ENCOUNTER — Ambulatory Visit: Admit: 2020-03-10 | Discharge: 2020-03-11 | Payer: PRIVATE HEALTH INSURANCE

## 2020-03-10 DIAGNOSIS — J324 Chronic pansinusitis: Principal | ICD-10-CM

## 2020-03-10 MED ORDER — DOXYCYCLINE HYCLATE 100 MG TABLET
ORAL_TABLET | Freq: Two times a day (BID) | ORAL | 0 refills | 28.00000 days | Status: CP
Start: 2020-03-10 — End: 2020-04-07

## 2020-03-10 MED ORDER — FLUTICASONE PROPIONATE 50 MCG/ACTUATION NASAL SPRAY,SUSPENSION
Freq: Two times a day (BID) | NASAL | 11 refills | 0.00000 days | Status: CP
Start: 2020-03-10 — End: ?

## 2020-03-11 ENCOUNTER — Encounter
Admit: 2020-03-11 | Discharge: 2020-03-11 | Payer: PRIVATE HEALTH INSURANCE | Attending: Obstetrics & Gynecology | Primary: Obstetrics & Gynecology

## 2020-03-11 ENCOUNTER — Encounter
Admit: 2020-03-11 | Discharge: 2020-03-11 | Payer: PRIVATE HEALTH INSURANCE | Attending: Physician Assistant | Primary: Physician Assistant

## 2020-03-11 DIAGNOSIS — Z113 Encounter for screening for infections with a predominantly sexual mode of transmission: Principal | ICD-10-CM

## 2020-03-11 DIAGNOSIS — Z01419 Encounter for gynecological examination (general) (routine) without abnormal findings: Principal | ICD-10-CM

## 2020-03-11 DIAGNOSIS — Z803 Family history of malignant neoplasm of breast: Principal | ICD-10-CM

## 2020-03-16 ENCOUNTER — Encounter
Admit: 2020-03-16 | Discharge: 2020-03-17 | Payer: PRIVATE HEALTH INSURANCE | Attending: Hematology | Primary: Hematology

## 2020-03-18 ENCOUNTER — Encounter
Admit: 2020-03-18 | Discharge: 2020-03-18 | Payer: PRIVATE HEALTH INSURANCE | Attending: Physician Assistant | Primary: Physician Assistant

## 2020-03-18 ENCOUNTER — Encounter: Admit: 2020-03-18 | Discharge: 2020-03-18 | Payer: PRIVATE HEALTH INSURANCE

## 2020-03-18 DIAGNOSIS — D72829 Elevated white blood cell count, unspecified: Principal | ICD-10-CM

## 2020-03-18 MED ORDER — TOPIRAMATE 25 MG TABLET
ORAL_TABLET | 2 refills | 0 days | Status: CP
Start: 2020-03-18 — End: ?

## 2020-03-26 ENCOUNTER — Encounter
Admit: 2020-03-26 | Discharge: 2020-03-27 | Payer: PRIVATE HEALTH INSURANCE | Attending: Anesthesiology | Primary: Anesthesiology

## 2020-03-26 DIAGNOSIS — M7062 Trochanteric bursitis, left hip: Principal | ICD-10-CM

## 2020-03-26 DIAGNOSIS — M7918 Myalgia, other site: Principal | ICD-10-CM

## 2020-03-26 MED ORDER — CELECOXIB 200 MG CAPSULE
ORAL_CAPSULE | Freq: Two times a day (BID) | ORAL | 2 refills | 30.00000 days | Status: CP
Start: 2020-03-26 — End: ?

## 2020-03-26 MED ORDER — DULOXETINE 60 MG CAPSULE,DELAYED RELEASE
ORAL_CAPSULE | Freq: Every day | ORAL | 2 refills | 30 days | Status: CP
Start: 2020-03-26 — End: 2020-06-24

## 2020-03-30 ENCOUNTER — Encounter: Admit: 2020-03-30 | Discharge: 2020-03-31 | Payer: PRIVATE HEALTH INSURANCE

## 2020-03-30 DIAGNOSIS — F411 Generalized anxiety disorder: Principal | ICD-10-CM

## 2020-03-30 MED ORDER — DIAZEPAM 10 MG TABLET
ORAL_TABLET | 0 refills | 0 days | Status: CP
Start: 2020-03-30 — End: ?

## 2020-04-24 ENCOUNTER — Ambulatory Visit
Admit: 2020-04-24 | Discharge: 2020-04-25 | Payer: PRIVATE HEALTH INSURANCE | Attending: Anesthesiology | Primary: Anesthesiology

## 2020-04-24 DIAGNOSIS — M797 Fibromyalgia: Principal | ICD-10-CM

## 2020-04-24 DIAGNOSIS — M47816 Spondylosis without myelopathy or radiculopathy, lumbar region: Principal | ICD-10-CM

## 2020-04-24 DIAGNOSIS — M7062 Trochanteric bursitis, left hip: Principal | ICD-10-CM

## 2020-04-24 DIAGNOSIS — G894 Chronic pain syndrome: Principal | ICD-10-CM

## 2020-04-24 DIAGNOSIS — M5416 Radiculopathy, lumbar region: Principal | ICD-10-CM

## 2020-04-24 DIAGNOSIS — M7918 Myalgia, other site: Principal | ICD-10-CM

## 2020-04-24 MED ORDER — BACLOFEN 5 MG TABLET
ORAL_TABLET | Freq: Every evening | ORAL | 2 refills | 30 days | Status: CP
Start: 2020-04-24 — End: 2020-05-24

## 2020-04-24 MED ORDER — CELECOXIB 200 MG CAPSULE
ORAL_CAPSULE | Freq: Two times a day (BID) | ORAL | 2 refills | 30 days | Status: CP
Start: 2020-04-24 — End: ?

## 2020-04-24 MED ORDER — GABAPENTIN 250 MG/5 ML ORAL SOLUTION
Freq: Every evening | ORAL | 2 refills | 30 days | Status: CP
Start: 2020-04-24 — End: ?

## 2020-04-24 MED ORDER — DULOXETINE 60 MG CAPSULE,DELAYED RELEASE
ORAL_CAPSULE | Freq: Every day | ORAL | 2 refills | 30 days | Status: CP
Start: 2020-04-24 — End: 2020-07-23

## 2020-04-27 ENCOUNTER — Encounter: Admit: 2020-04-27 | Discharge: 2020-04-27 | Payer: PRIVATE HEALTH INSURANCE

## 2020-04-27 ENCOUNTER — Ambulatory Visit: Admit: 2020-04-27 | Discharge: 2020-04-27 | Payer: PRIVATE HEALTH INSURANCE

## 2020-04-29 DIAGNOSIS — L732 Hidradenitis suppurativa: Principal | ICD-10-CM

## 2020-04-29 MED ORDER — DOXYCYCLINE HYCLATE 100 MG CAPSULE
ORAL_CAPSULE | 4 refills | 0.00000 days | Status: CP
Start: 2020-04-29 — End: ?

## 2020-04-30 ENCOUNTER — Ambulatory Visit: Admit: 2020-04-30 | Discharge: 2020-05-01 | Payer: PRIVATE HEALTH INSURANCE

## 2020-04-30 DIAGNOSIS — L732 Hidradenitis suppurativa: Principal | ICD-10-CM

## 2020-04-30 MED ORDER — CLINDAMYCIN PHOSPHATE 1 % TOPICAL SOLUTION
TOPICAL | 4 refills | 0.00000 days | Status: CP
Start: 2020-04-30 — End: ?

## 2020-05-06 ENCOUNTER — Ambulatory Visit: Admit: 2020-05-06 | Discharge: 2020-05-07 | Payer: PRIVATE HEALTH INSURANCE

## 2020-06-05 DIAGNOSIS — L732 Hidradenitis suppurativa: Principal | ICD-10-CM

## 2020-06-08 MED ORDER — SPIRONOLACTONE 50 MG TABLET
ORAL_TABLET | 1 refills | 0.00000 days | Status: CP
Start: 2020-06-08 — End: ?

## 2020-06-16 ENCOUNTER — Ambulatory Visit: Admit: 2020-06-16 | Discharge: 2020-06-17 | Payer: PRIVATE HEALTH INSURANCE

## 2020-06-16 DIAGNOSIS — L732 Hidradenitis suppurativa: Principal | ICD-10-CM

## 2020-06-16 DIAGNOSIS — D492 Neoplasm of unspecified behavior of bone, soft tissue, and skin: Principal | ICD-10-CM

## 2020-06-16 MED ORDER — TRAMADOL 50 MG TABLET
ORAL_TABLET | Freq: Four times a day (QID) | ORAL | 0 refills | 4.00000 days | Status: CP | PRN
Start: 2020-06-16 — End: ?

## 2020-06-22 MED ORDER — GABAPENTIN 250 MG/5 ML ORAL SOLUTION
Freq: Every evening | ORAL | 2 refills | 30 days | Status: CP
Start: 2020-06-22 — End: ?

## 2020-06-30 MED ORDER — TOPIRAMATE 25 MG TABLET
ORAL_TABLET | 2 refills | 0.00000 days | Status: CP
Start: 2020-06-30 — End: ?

## 2020-07-01 ENCOUNTER — Ambulatory Visit
Admit: 2020-07-01 | Discharge: 2020-07-02 | Payer: PRIVATE HEALTH INSURANCE | Attending: Dermatology | Primary: Dermatology

## 2020-07-01 MED ORDER — METRONIDAZOLE 500 MG TABLET
ORAL_TABLET | Freq: Two times a day (BID) | ORAL | 0 refills | 14.00000 days | Status: CP
Start: 2020-07-01 — End: 2020-07-15

## 2020-07-03 DIAGNOSIS — L732 Hidradenitis suppurativa: Principal | ICD-10-CM

## 2020-07-03 MED ORDER — TRIAMCINOLONE ACETONIDE 0.1 % TOPICAL OINTMENT
Freq: Two times a day (BID) | TOPICAL | 0 refills | 0.00000 days | Status: CP
Start: 2020-07-03 — End: 2021-07-03

## 2020-07-06 ENCOUNTER — Ambulatory Visit: Admit: 2020-07-06 | Discharge: 2020-07-07 | Payer: PRIVATE HEALTH INSURANCE

## 2020-07-06 DIAGNOSIS — L732 Hidradenitis suppurativa: Principal | ICD-10-CM

## 2020-07-06 MED ORDER — TRIAMCINOLONE ACETONIDE 0.1 % TOPICAL OINTMENT
Freq: Two times a day (BID) | TOPICAL | 2 refills | 0.00000 days | Status: CP
Start: 2020-07-06 — End: 2021-07-06

## 2020-07-14 ENCOUNTER — Ambulatory Visit
Admit: 2020-07-14 | Discharge: 2020-07-15 | Payer: PRIVATE HEALTH INSURANCE | Attending: Anesthesiology | Primary: Anesthesiology

## 2020-07-14 DIAGNOSIS — G894 Chronic pain syndrome: Principal | ICD-10-CM

## 2020-07-14 DIAGNOSIS — G8929 Other chronic pain: Secondary | ICD-10-CM

## 2020-07-14 DIAGNOSIS — M546 Pain in thoracic spine: Principal | ICD-10-CM

## 2020-07-14 DIAGNOSIS — M7918 Myalgia, other site: Principal | ICD-10-CM

## 2020-07-14 DIAGNOSIS — M5442 Lumbago with sciatica, left side: Principal | ICD-10-CM

## 2020-07-14 MED ORDER — CELECOXIB 200 MG CAPSULE
ORAL_CAPSULE | Freq: Two times a day (BID) | ORAL | 2 refills | 30.00000 days | Status: CP
Start: 2020-07-14 — End: ?

## 2020-07-14 MED ORDER — DULOXETINE 60 MG CAPSULE,DELAYED RELEASE
ORAL_CAPSULE | Freq: Every day | ORAL | 2 refills | 30.00000 days | Status: CP
Start: 2020-07-14 — End: 2020-10-12

## 2020-07-14 MED ORDER — BACLOFEN 5 MG TABLET
ORAL_TABLET | Freq: Every evening | ORAL | 1 refills | 90 days | Status: CP
Start: 2020-07-14 — End: 2020-10-12

## 2020-07-21 MED ORDER — CLOTRIMAZOLE-BETAMETHASONE 1 %-0.05 % TOPICAL CREAM
Freq: Two times a day (BID) | TOPICAL | 11 refills | 0.00000 days | Status: CP
Start: 2020-07-21 — End: 2021-07-21

## 2020-07-30 ENCOUNTER — Ambulatory Visit: Admit: 2020-07-30 | Discharge: 2020-07-31 | Payer: PRIVATE HEALTH INSURANCE

## 2020-07-30 DIAGNOSIS — W57XXXA Bitten or stung by nonvenomous insect and other nonvenomous arthropods, initial encounter: Principal | ICD-10-CM

## 2020-08-11 ENCOUNTER — Ambulatory Visit: Admit: 2020-08-11 | Discharge: 2020-08-12 | Payer: PRIVATE HEALTH INSURANCE

## 2020-08-11 MED ORDER — NYSTATIN 100,000 UNIT/GRAM TOPICAL POWDER
Freq: Two times a day (BID) | TOPICAL | 11 refills | 60.00000 days | Status: CP
Start: 2020-08-11 — End: 2021-08-11

## 2020-09-21 ENCOUNTER — Ambulatory Visit: Admit: 2020-09-21 | Discharge: 2020-09-22 | Payer: PRIVATE HEALTH INSURANCE

## 2020-09-21 ENCOUNTER — Ambulatory Visit (INDEPENDENT_AMBULATORY_CARE_PROVIDER_SITE_OTHER): Payer: Medicaid Other | Admitting: Internal Medicine

## 2020-09-21 VITALS — BP 111/55 | HR 84 | Ht 66.0 in | Wt 356.0 lb

## 2020-09-21 DIAGNOSIS — L732 Hidradenitis suppurativa: Principal | ICD-10-CM

## 2020-09-21 DIAGNOSIS — G4733 Obstructive sleep apnea (adult) (pediatric): Secondary | ICD-10-CM

## 2020-09-21 DIAGNOSIS — G4721 Circadian rhythm sleep disorder, delayed sleep phase type: Secondary | ICD-10-CM | POA: Diagnosis not present

## 2020-09-21 MED ORDER — SPIRONOLACTONE 100 MG TABLET
ORAL_TABLET | Freq: Every day | ORAL | 11 refills | 90.00000 days | Status: CP
Start: 2020-09-21 — End: 2020-12-22

## 2020-09-21 NOTE — Patient Instructions (Signed)

## 2020-09-21 NOTE — Progress Notes (Signed)
Sleep Medicine   Office Visit  Patient Name: Jenny Diaz DOB: [birth year] MRN [id number]    Chief Complaint: insomnia  Brief History:  [name] presents with a long history of insomnia. 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 She has fibromyalgia and arthritis. She has been seeing another sleep specialist who recommended Lunesta which she never took and some kind of stimulant.  She notes that she has always been a night owl starting in high school. It wasn't until she had children and had to get up early that she became sleepy.  She goes to bed between 1 and 3 a.m. but she is not sleepy at that time. She lies in bed for 2 hours then falls asleep until 11:30-noon.She feels that the sleep is light. She feels exhausted when she wakes. Then after a few days she will crash and sleep for 12 hours very heavy.  She snore a little She denies sleep paralysis, cataplexy and hypnogogic hallucinations. She wakes herself coughing and reports frequent urination and occasional reflux. Patient has noted no restlessness of her legs at night.  The patient  relates no unusual behavior during the night.  The patient has been diagnosed with generalized anxiety disorder but is not taking any medication.  She reports daytime sleepiness. She tries to avoid naps as they make her feel worse.  The Epworth Sleepiness Score is 13 out of 24 .  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    ROS  General: (-) fever, (-) chills, (-) night sweat Nose and Sinuses: (-) nasal stuffiness or itchiness, (-) postnasal drip, (-) nosebleeds, (-) sinus trouble. Mouth and Throat: (-) sore throat, (-) hoarseness. Neck: (-) swollen glands, (-) enlarged thyroid, (-) neck pain. Respiratory: - cough, - shortness of breath, - wheezing. Neurologic: - numbness, - tingling. Psychiatric: - anxiety, - depression Sleep behavior: -sleep paralysis  -hypnogogic hallucinations -dream enactment -vivid dreams -cataplexy -night terrors -sleep walking   Current Medication: Outpatient Encounter Medications as of 09/21/2020  Medication Sig  . albuterol (PROAIR HFA) 108 (90 Base) MCG/ACT inhaler Inhale into the lungs.  . Baclofen 5 MG TABS Take by mouth.  . celecoxib (CELEBREX) 200 MG capsule Take by mouth.  . DULoxetine (CYMBALTA) 60 MG capsule Take by mouth.  . fluticasone (FLONASE) 50 MCG/ACT nasal spray 2 sprays by Each Nare route two (2) times a day.  . levonorgestrel (KYLEENA) 19.5 MG IUD TO BE INSERTED ONE TIME BY PRESCRIBER. ROUTE INTRAUTERINE.  Marland Kitchen montelukast (SINGULAIR) 10 MG tablet Take by mouth.  . spironolactone (ALDACTONE) 50 MG tablet TAKE 1 TABLET (50 MG TOTAL) BY MOUTH DAILY WITH FOOD.  . Azelastine-Fluticasone 137-50 MCG/ACT SUSP Place into the nose.  Marland Kitchen Cetirizine HCl 10 MG CAPS Take by mouth.  . Cholecalciferol 50 MCG (2000 UT) CAPS Take by mouth.  . cyclobenzaprine (FLEXERIL) 10 MG tablet Take 1 tablet (10 mg total) by mouth 3 (three) times daily as needed for muscle spasms.  . hydroxychloroquine (PLAQUENIL) 200 MG tablet Plaquenil 200 mg tablet  Take 1 po bid wiht food  . ipratropium (ATROVENT) 0.06 % nasal spray Place 2 sprays into the nose 3 (three) times daily.  . Melatonin 5 MG CAPS Take by mouth.  . Multiple Vitamin (DAILY VITAMINS) tablet Take 1 tablet by mouth daily.  . naproxen (NAPROSYN) 500 MG tablet Take 1 tablet (500 mg total) by mouth 2 (two) times  daily with a meal.   No facility-administered encounter medications on file as of 09/21/2020.    Surgical History: Past Surgical History:  Procedure Laterality Date  . CHOLECYSTECTOMY    . TONSILLECTOMY      Medical History: Past Medical History:  Diagnosis Date  . Arthritis   . Chronic back pain   . Chronic fatigue syndrome   . Delayed sleep phase syndrome   . Fibromyalgia   . Hidradenitis suppurativa     Family History: Non contributory to the  present illness  Social History: Social History   Socioeconomic History  . Marital status: Divorced    Spouse name: Not on file  . Number of children: Not on file  . Years of education: Not on file  . Highest education level: Not on file  Occupational History  . Not on file  Tobacco Use  . Smoking status: Former Games developer  . Smokeless tobacco: Never Used  Vaping Use  . Vaping Use: Never used  Substance and Sexual Activity  . Alcohol use: Yes  . Drug use: Never  . Sexual activity: Not on file  Other Topics Concern  . Not on file  Social History Narrative  . Not on file   Social Determinants of Health   Financial Resource Strain:   . Difficulty of Paying Living Expenses: Not on file  Food Insecurity:   . Worried About Programme researcher, broadcasting/film/video in the Last Year: Not on file  . Ran Out of Food in the Last Year: Not on file  Transportation Needs:   . Lack of Transportation (Medical): Not on file  . Lack of Transportation (Non-Medical): Not on file  Physical Activity:   . Days of Exercise per Week: Not on file  . Minutes of Exercise per Session: Not on file  Stress:   . Feeling of Stress : Not on file  Social Connections:   . Frequency of Communication with Friends and Family: Not on file  . Frequency of Social Gatherings with Friends and Family: Not on file  . Attends Religious Services: Not on file  . Active Member of Clubs or Organizations: Not on file  . Attends Banker Meetings: Not on file  . Marital Status: Not on file  Intimate Partner Violence:   . Fear of Current or Ex-Partner: Not on file  . Emotionally Abused: Not on file  . Physically Abused: Not on file  . Sexually Abused: Not on file    Vital Signs: Blood pressure (!) 111/55, pulse 84, height 5\' 6"  (1.676 m), weight (!) 356 lb (161.5 kg), SpO2 100 %.  Examination: General Appearance: The patient is well-developed, well-nourished, and in no distress. Neck Circumference: 42 Skin: Gross  inspection of skin unremarkable. Head: normocephalic, no gross deformities. Eyes: no gross deformities noted. ENT: ears appear grossly normal Neurologic: Alert and oriented. No involuntary movements.    EPWORTH SLEEPINESS SCALE:  Scale:  (0)= no chance of dozing; (1)= slight chance of dozing; (2)= moderate chance of dozing; (3)= high chance of dozing  Chance  Situtation    Sitting and reading: 3    Watching TV: 2    Sitting Inactive in public: 2    As a passenger in car: 1      Lying down to rest: 3    Sitting and talking: 0    Sitting quielty after lunch: 2    In a car, stopped in traffic: 0   TOTAL SCORE:   13 out of  24    SLEEP STUDIES:  1. Not available   LABS: No results found for this or any previous visit (from the past 2160 hour(s)).  Radiology: No results found.   Assessment and Plan: Patient Active Problem List   Diagnosis Date Noted  . Delayed sleep phase syndrome      The patient has a history strongly suggestive of a delayed sleep phase syndrome.  This disorder and treatment recommendations were discussed. She would like to try to adjust her phase and instructions on early morning bright light and evening melatonin were discussed. She still would benefit from a repeat sleep study given her history of snoring, morbid obestiy and daytime hypersomnia.   1. OSA- a PSG will be ordered 2. Delayed sleep phase syndrome- patient will start a course of early morning bright light with the GoLite and evening melatonin. 3. Obesity weight loss diet and exercise BMI 57.46  General Counseling: I have discussed the findings of the evaluation and examination with Marcelino Duster.  I have also discussed any further diagnostic evaluation thatmay be needed or ordered today. Shirely verbalizes understanding of the findings of todays visit. We also reviewed her medications today and discussed drug interactions and side effects including but not limited excessive  drowsiness and altered mental states. We also discussed that there is always a risk not just to her but also people around her. she has been encouraged to call the office with any questions or concerns that should arise related to todays visit.  I have personally obtained a history, evaluated the patient, evaluated pertinent data, formulated the assessment and plan and placed orders.  This patient was seen by Theotis Burrow, AGNP-C in collaboration with Dr. Freda Munro as a part of collaborative care agreement.  Valentino Hue Sol Blazing, PhD, FAASM  Diplomate, American Board of Sleep Medicine    Yevonne Pax, MD [facility] Diplomate ABMS Pulmonary and Critical Care Medicine Sleep medicine

## 2020-10-07 ENCOUNTER — Ambulatory Visit
Admit: 2020-10-07 | Discharge: 2020-10-08 | Payer: PRIVATE HEALTH INSURANCE | Attending: Student in an Organized Health Care Education/Training Program | Primary: Student in an Organized Health Care Education/Training Program

## 2020-10-07 DIAGNOSIS — G894 Chronic pain syndrome: Principal | ICD-10-CM

## 2020-10-07 DIAGNOSIS — R7303 Prediabetes: Principal | ICD-10-CM

## 2020-10-07 DIAGNOSIS — L732 Hidradenitis suppurativa: Principal | ICD-10-CM

## 2020-10-07 DIAGNOSIS — M1990 Unspecified osteoarthritis, unspecified site: Principal | ICD-10-CM

## 2020-10-07 DIAGNOSIS — R109 Unspecified abdominal pain: Principal | ICD-10-CM

## 2020-10-07 DIAGNOSIS — Z6841 Body Mass Index (BMI) 40.0 and over, adult: Principal | ICD-10-CM

## 2020-10-07 MED ORDER — DULAGLUTIDE 0.75 MG/0.5 ML SUBCUTANEOUS PEN INJECTOR
SUBCUTANEOUS | 0 refills | 98.00000 days | Status: CP
Start: 2020-10-07 — End: 2020-12-16

## 2020-10-08 DIAGNOSIS — L732 Hidradenitis suppurativa: Principal | ICD-10-CM

## 2020-10-09 ENCOUNTER — Ambulatory Visit
Admit: 2020-10-09 | Discharge: 2020-10-10 | Payer: PRIVATE HEALTH INSURANCE | Attending: Anesthesiology | Primary: Anesthesiology

## 2020-10-09 DIAGNOSIS — M546 Pain in thoracic spine: Principal | ICD-10-CM

## 2020-10-09 MED ORDER — DULOXETINE 60 MG CAPSULE,DELAYED RELEASE
ORAL_CAPSULE | Freq: Every day | ORAL | 5 refills | 30.00000 days | Status: CP
Start: 2020-10-09 — End: 2021-01-07

## 2020-10-09 MED ORDER — DOXYCYCLINE HYCLATE 100 MG CAPSULE
ORAL_CAPSULE | 4 refills | 0.00000 days | Status: CP
Start: 2020-10-09 — End: ?

## 2020-10-09 MED ORDER — BACLOFEN 5 MG TABLET
ORAL_TABLET | Freq: Every evening | ORAL | 1 refills | 90.00000 days | Status: CP
Start: 2020-10-09 — End: 2021-01-07

## 2020-10-09 MED ORDER — CELECOXIB 200 MG CAPSULE: 200 mg | capsule | Freq: Two times a day (BID) | 2 refills | 30 days | Status: AC

## 2020-10-09 MED ORDER — CELECOXIB 200 MG CAPSULE
ORAL_CAPSULE | Freq: Two times a day (BID) | ORAL | 5 refills | 30.00000 days | Status: CP
Start: 2020-10-09 — End: 2020-10-09

## 2020-10-09 MED ORDER — DULOXETINE 60 MG CAPSULE,DELAYED RELEASE: 60 mg | capsule | Freq: Every day | 2 refills | 30 days | Status: AC
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
## 2020-10-21 MED ORDER — ONDANSETRON HCL 4 MG TABLET
ORAL_TABLET | Freq: Two times a day (BID) | ORAL | 0 refills | 5.00000 days | Status: CP | PRN
Start: 2020-10-21 — End: 2021-10-21

## 2020-10-22 MED ORDER — AZELASTINE 137 MCG (0.1 %) NASAL SPRAY AEROSOL
Freq: Two times a day (BID) | NASAL | 11 refills | 0.00000 days | Status: CP
Start: 2020-10-22 — End: 2021-10-22

## 2020-10-23 DIAGNOSIS — J329 Chronic sinusitis, unspecified: Principal | ICD-10-CM

## 2020-11-06 DIAGNOSIS — L732 Hidradenitis suppurativa: Principal | ICD-10-CM

## 2020-11-06 MED ORDER — SPIRONOLACTONE 50 MG TABLET
ORAL_TABLET | 1 refills | 0.00000 days | Status: CP
Start: 2020-11-06 — End: ?

## 2020-11-26 DIAGNOSIS — J329 Chronic sinusitis, unspecified: Principal | ICD-10-CM

## 2020-11-26 MED ORDER — CLOBETASOL 0.05 % TOPICAL OINTMENT
OPHTHALMIC | 5 refills | 0.00000 days | Status: CP
Start: 2020-11-26 — End: ?

## 2020-12-16 MED ORDER — SEMAGLUTIDE 3 MG TABLET
ORAL_TABLET | Freq: Every day | ORAL | 0 refills | 0 days | Status: CP
Start: 2020-12-16 — End: ?

## 2020-12-18 DIAGNOSIS — Z1231 Encounter for screening mammogram for malignant neoplasm of breast: Principal | ICD-10-CM

## 2020-12-22 ENCOUNTER — Ambulatory Visit: Admit: 2020-12-22 | Discharge: 2020-12-23 | Payer: PRIVATE HEALTH INSURANCE

## 2020-12-22 DIAGNOSIS — Z79899 Other long term (current) drug therapy: Principal | ICD-10-CM

## 2020-12-22 DIAGNOSIS — G8918 Other acute postprocedural pain: Principal | ICD-10-CM

## 2020-12-22 DIAGNOSIS — L732 Hidradenitis suppurativa: Principal | ICD-10-CM

## 2020-12-22 MED ORDER — TRAMADOL 50 MG TABLET
ORAL_TABLET | Freq: Four times a day (QID) | ORAL | 0 refills | 3.00000 days | Status: CP
Start: 2020-12-22 — End: ?

## 2020-12-22 MED ORDER — HUMIRA PEN CITRATE FREE STARTER PACK FOR CROHN'S/UC/HS 3 X 80 MG/0.8 ML
ORAL | 0 refills | 0.00000 days | Status: CP
Start: 2020-12-22 — End: ?

## 2020-12-22 MED ORDER — HUMIRA PEN CITRATE FREE 40 MG/0.4 ML
SUBCUTANEOUS | 5 refills | 28.00000 days | Status: CP
Start: 2020-12-22 — End: 2021-01-21
  Filled 2021-01-04: qty 3, 28d supply, fill #0

## 2020-12-23 DIAGNOSIS — L732 Hidradenitis suppurativa: Principal | ICD-10-CM

## 2020-12-30 ENCOUNTER — Ambulatory Visit: Admit: 2020-12-30 | Payer: PRIVATE HEALTH INSURANCE

## 2020-12-31 MED ORDER — EMPTY CONTAINER
3 refills | 0 days
Start: 2020-12-31 — End: ?

## 2020-12-31 NOTE — Unmapped (Signed)
I reviewed this patient case and all documentation provided by the learner and was readily available for consultation during their interaction with the patient.  I agree with the assessment and plan listed below.    Meghan A Katrinka Blazing   [facility] Specialty Pharmacist        **Humira Injection instructions and manufacturer injection video sent to patient via MyChart. [facility]   Patient Onboarding/Medication Counseling    Ms.Horrell is a 40 y.o. female with Hidradenitis suppurativa who I am counseling today on initiation of therapy.  I am speaking to the patient.    Was a Nurse, learning disability used for this call? No    Verified patient's date of birth / HIPAA.    Specialty medication(s) to be sent: Inflammatory Disorders: Humira      Non-specialty medications/supplies to be sent: Transport planner      Medications not needed at this time: n/a         Humira (adalimumab)    Medication & Administration     Dosage: Hidradenitis supprativa: Inject 160mg  under the skin on day 1, 80mg  on day 15, then 40mg  every 7 days starting on day 29    Lab tests required prior to treatment initiation:  ??? Tuberculosis: Tuberculosis screening resulted in a non-reactive Quantiferon TB Gold assay.  ??? Hepatitis B: Hepatitis B serology studies are complete and non-reactive.    Administration:     Prefilled auto-injector pen  1. Gather all supplies needed for injection on a clean, flat working surface: medication pen removed from packaging, alcohol swab, sharps container, etc.  2. Look at the medication label - look for correct medication, correct dose, and check the expiration date  3. Look at the medication - the liquid visible in the window on the side of the pen device should appear clear and colorless  4. Lay the auto-injector pen on a flat surface and allow it to warm up to room temperature for at least 30-45 minutes  5. Select injection site - you can use the front of your thigh or your belly (but not the area 2 inches around your belly button); if someone else is giving you the injection you can also use your upper arm in the skin covering your triceps muscle  6. Prepare injection site - wash your hands and clean the skin at the injection site with an alcohol swab and let it air dry, do not touch the injection site again before the injection  7. Pull the 2 safety caps straight off - gray/white to uncover the needle cover and the plum cap to uncover the plum activator button, do not remove until immediately prior to injection and do not touch the white needle cover  8. Gently squeeze the area of cleaned skin and hold it firmly to create a firm surface at the selected injection site  9. Put the white needle cover against your skin at the injection site at a 90 degree angle, hold the pen such that you can see the clear medication window  10. Press down and hold the pen firmly against your skin, press the plum activator button to initiate the injection, there will be a click when the injection starts  11. Continue to hold the pen firmly against your skin for about 10-15 seconds - the window will start to turn solid yellow  12. To verify the injection is complete after 10-15 seconds, look and ensure the window is  solid yellow and then pull the pen away from your skin  13. Dispose of the used auto-injector pen immediately in your sharps disposal container the needle will be covered automatically  14. If you see any blood at the injection site, press a cotton ball or gauze on the site and maintain pressure until the bleeding stops, do not rub the injection site    Adherence/Missed dose instructions:  If your injection is given more than 3 days after your scheduled injection date ??? consult your pharmacist for additional instructions on how to adjust your dosing schedule.    Goals of Therapy     - Reduce the frequency and severity of new lesions  - Minimize pain and suppuration  - Prevent disease progression and limit scarring  - Maintenance of effective psychosocial functioning    Side Effects & Monitoring Parameters     ??? Injection site reaction (redness, irritation, inflammation localized to the site of administration)  ??? Signs of a common cold ??? minor sore throat, runny or stuffy nose, etc.  ??? Upset stomach  ??? Headache    The following side effects should be reported to the provider:  ??? Signs of a hypersensitivity reaction ??? rash; hives; itching; red, swollen, blistered, or peeling skin; wheezing; tightness in the chest or throat; difficulty breathing, swallowing, or talking; swelling of the mouth, face, lips, tongue, or throat; etc.  ??? Reduced immune function ??? report signs of infection such as fever; chills; body aches; very bad sore throat; ear or sinus pain; cough; more sputum or change in color of sputum; pain with passing urine; wound that will not heal, etc.  Also at a slightly higher risk of some malignancies (mainly skin and blood cancers) due to this reduced immune function.  o In the case of signs of infection ??? the patient should hold the next dose of Humira?? and call your primary care provider to ensure adequate medical care.  Treatment may be resumed when infection is treated and patient is asymptomatic.  ??? Changes in skin ??? a new growth or lump that forms; changes in shape, size, or color of a previous mole or marking  ??? Signs of unexplained bruising or bleeding ??? throwing up blood or emesis that looks like coffee grounds; black, tarry, or bloody stool; etc.  ??? Signs of new or worsening heart failure ??? shortness of breath; sudden weight gain; heartbeat that is not normal; swelling in the arms or legs that is new or worse      Contraindications, Warnings, & Precautions     ??? Have your bloodwork checked as you have been told by your prescriber  ??? Talk with your doctor if you are pregnant, planning to become pregnant, or breastfeeding  ??? Discuss the possible need for holding your dose(s) of Humira?? when a planned procedure is scheduled with the prescriber as it may delay healing/recovery timeline       Drug/Food Interactions     ??? Medication list reviewed in Epic. The patient was instructed to inform the care team before taking any new medications or supplements. No drug interactions identified.   ??? Talk with you prescriber or pharmacist before receiving any live vaccinations while taking this medication and after you stop taking it    Storage, Handling Precautions, & Disposal     ??? Store this medication in the refrigerator.  Do not freeze  ??? If needed, you may store at room temperature for up to 14 days  ??? Store  in original packaging, protected from light  ??? Do not shake  ??? Dispose of used syringes/pens in a sharps disposal container            Current Medications (including OTC/herbals), Comorbidities and Allergies     Current Outpatient Medications   Medication Sig Dispense Refill   ??? baclofen (LIORESAL) 5 mg Tab tablet Take 1 tablet (5 mg total) by mouth at bedtime. 90 tablet 1   ??? celecoxib (CELEBREX) 200 MG capsule Take 1 capsule (200 mg total) by mouth Two (2) times a day. 60 capsule 5   ??? cetirizine (ZYRTEC) 10 mg cap Take 10 mg by mouth.     ??? clindamycin (CLEOCIN T) 1 % external solution APPLY TOPICALLY TO THE AFFECTED AREA EVERY MORNING 60 mL 4   ??? clobetasoL (TEMOVATE) 0.05 % ointment Twice daily as needed for spot treatment for up to one week on each spot 60 g 5   ??? doxycycline (VIBRAMYCIN) 100 MG capsule TAKE 1 TABLET BY MOUTH DAILY. USE TWICE A DAY X 7-10 DAYS FOR ACUTE FLARE. 30 capsule 4   ??? DULoxetine (CYMBALTA) 60 MG capsule Take 1 capsule (60 mg total) by mouth daily. 30 capsule 5   ??? fluticasone propionate (FLONASE) 50 mcg/actuation nasal spray 2 sprays by Each Nare route two (2) times a day. 32 g 11   ??? HUMIRA PEN CITRATE FREE 40 MG/0.4 ML Inject the contents of 1 pen (40 mg total) under the skin every seven (7) days. 4 each 5   ??? HUMIRA PEN CITRATE FREE STARTER PACK FOR CROHN'S/UC/HS 3 X 80 MG/0.8 ML Inject the contents of 2 pens (160 mg) on day 1, THEN 1 pen (80 mg) on day 15. THEN 1 pen (40 mg) every 2 weeks 3 each 0   ??? hydrOXYchloroQUINE (PLAQUENIL) 200 mg tablet Plaquenil 200 mg tablet   Take 1 po bid wiht food     ??? magnesium oxide 200 mg magnesium Chew magnesium     ??? melatonin 5 mg cap Take by mouth.     ??? multivitamin (THERAGRAN) per tablet Take 1 tablet by mouth daily.     ??? semaglutide 3 mg Tab Take 3 mg by mouth daily. 30 tablet 0   ??? spironolactone (ALDACTONE) 50 MG tablet TAKE 1 TABLET (50 MG TOTAL) BY MOUTH DAILY WITH FOOD. 90 tablet 1   ??? traMADoL (ULTRAM) 50 mg tablet Take 1 tablet (50 mg total) by mouth every six (6) hours. 10 tablet 0   ??? triamcinolone (KENALOG) 0.1 % cream Use twice a day on the itchy spots on the knees, arms, legs until itching is gone, then stop and use only simple moisturizer 80 g 2     No current facility-administered medications for this visit.       Allergies   Allergen Reactions   ??? Naltrexone    ??? Penicillins Other (See Comments)   ??? Augmentin [Amoxicillin-Pot Clavulanate] Rash   ??? Ceclor [Cefaclor] Rash       Patient Active Problem List   Diagnosis   ??? 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 Pain in thoracic spine at multiple sites   ??? Allergic rhinitis   ??? Morbid obesity (CMS-HCC)   ??? Anxiety   ??? Chronic pain syndrome   ??? Mild intermittent asthma without complication   ??? Vitamin D deficiency   ??? Mixed incontinence   ??? Anxiety, generalized       Reviewed and up to date  in Epic.    Appropriateness of Therapy     Is medication and dose appropriate based on diagnosis? Yes    Prescription has been clinically reviewed: Yes    Baseline Quality of Life Assessment      How many days over the past month did your hidradenitis suppurativa  keep you from your normal activities? For example, brushing your teeth or getting up in the morning. Patient declined to answer    Financial Information     Medication Assistance provided: Prior Authorization    Anticipated copay of $0 / 28 days for starter and maintenance reviewed with patient. Verified delivery address.    Delivery Information     Scheduled delivery date: 01/05/21    Expected start date: 01/05/21    Medication will be delivered via UPS to the prescription address in [facility].  This shipment will not require a signature.      Explained the services we provide at [facility] and that each month we would call to set up refills.  Stressed importance of returning phone calls so that we could ensure they receive their medications in time each month.  Informed patient that we should be setting up refills 7-10 days prior to when they will run out of medication.  A pharmacist will reach out to perform a clinical assessment periodically.  Informed patient that a welcome packet and a drug information handout will be sent.      Patient verbalized understanding of the above information as well as how to contact the pharmacy at [phone] option 4 with any questions/concerns.  The pharmacy is open Monday through Friday 8:30am-4:30pm.  A pharmacist is available 24/7 via pager to answer any clinical questions they may have.    Patient Specific Needs     - Does the patient have any physical, cognitive, or cultural barriers? No    - Patient prefers to have medications discussed with  Patient     - Is the patient or caregiver able to read and understand education materials at a high school level or above? Yes    - Patient's primary language is  English     - Is the patient high risk? No    - Does the patient require a Care Management Plan? No     - Does the patient require physician intervention or other additional services (i.e. nutrition, smoking cessation, social work)? No      Vernon Prey  [facility] Specialty Pharmacist

## 2021-01-04 MED FILL — EMPTY CONTAINER: 120 days supply | Qty: 1 | Fill #0

## 2021-01-13 ENCOUNTER — Ambulatory Visit
Admit: 2021-01-13 | Payer: PRIVATE HEALTH INSURANCE | Attending: Student in an Organized Health Care Education/Training Program | Primary: Student in an Organized Health Care Education/Training Program

## 2021-01-15 MED ORDER — DOXYCYCLINE HYCLATE 100 MG CAPSULE
ORAL_CAPSULE | 4 refills | 0.00000 days
Start: 2021-01-15 — End: ?

## 2021-01-15 MED ORDER — TRIAMCINOLONE ACETONIDE 0.1 % TOPICAL CREAM
2 refills | 0.00000 days
Start: 2021-01-15 — End: ?

## 2021-01-17 NOTE — Unmapped (Signed)
Dermatology Note     Assessment and Plan:      Hidradenitis Suppurativa, improving but not at goal   - Healing well from deroofing of right mons lesion with some residual hypertrophic scarring   - ILK to left mons (procedure note below)  - based on discussion above and r/b/a reviewed, mutual decision to proceed with the following   - Continue Humira 40 mg weekly   - Continue spironolactone at 100 mg daily   - Use triamcinolone 0.1% cream as needed for flares     Intralesional Kenalog Procedure Note: After the patient was informed of risks (including atrophy and dyspigmentation), benefits and side effects of intralesional steroid injection, the patient elected to undergo injection and verbal consent was obtained. Skin was cleaned with alcohol and injected intralesionally into the sites (below). The patient tolerated the procedure well without complications and was instructed on post-procedure care.  Location(s): Left mons   Number of sites treated: 1  Kenalog (triamcinolone) Concentration: 40 mg/ml   Volume: 0.1 ml total    High risk medication use (Humira)   - Hepatitis and Quantiferon TB up to date (12/22/2020)    The patient was advised to call for an appointment should any new, changing, or symptomatic lesions develop.     RTC: Return in about 4 months (around 05/18/2021). or sooner as needed   _________________________________________________________________      Chief Complaint     Follow-up of HS    HPI     Teresa Bowman is a 40 y.o. female who presents as a returning patient (last seen 12/22/2020) to [facility] for follow-up of hidradenitis suppurativa. At last visit, patient underwent ILK injection to the right labia majora, unroofing/excision of 2 right mons pubis lesions as well as was started on Humira 40 mg weekly.     Today, she notes flaring to the left groin area. Her right mons pubis lesions are healing well from recent unroofing procedure on the right. She has been using iodine when the areas flare up.     Self-reported severity (0-5): 2  VAS pain today: 3  VAS average pain for the last month: 6  Requiring pain medication? No.    How often in pain?  continuously  Level of odor (0-5): 2  Level of itching (0-5): 1  Dressing changes needed for drainage:Once a day  How much drainage: a little drainage  Flare in the last month (Y/N)? Yes.  How long ago was the last flare? in last 6 months  Developing new lesions? less than monthly  Number of inflammatory lesions montly: 1-3  DLQI: 16  Current treatment: HUMIRA       How helpful is the current treatment in managing the following aspects of your disease?  Not at all helpful Somewhat helpful Very helpful   Pain X ?? ??   Decreasing length of flares ?? ?? X   Decreasing new lesions ?? X ??   Drainage ?? X ??   Decreasing frequency of flares ?? X ??   Decreasing severity of flares ?? X ??   Odor ?? X ??       The patient denies any other new or changing lesions or areas of concern.     Pertinent Past Medical History     HS   Autoimmune arthropathy     Past Medical History, Family History, Social History, Medication List, Allergies, and Problem List were reviewed in the rooming section of Epic.  ROS: Other than symptoms mentioned in the HPI, no fevers, chills, or other skin complaints    Physical Examination     Gen: Well-appearing patient, appropriate, interactive, in no acute distress  Skin: Per patient request, examination of the groin and bilateral lower legs was performed and pertinent for:     location Abscess Inflamed nodule Non-inflamed nodule Draining sinus Non-draining Sinus Hurley BSA at site Color change  Inflamed induration Open skin surface  Tunnels   R axilla              L axilla              R inframammary              L inframammary              Intermammary              Pubic              R inguinal              R thigh              L inguinal  1            L thigh              Scrotum/Vulva              Perianal              R buttock              L buttock              Other (list)                            *0=none, 1=mild, 2=moderate, 3=severe    - 15 mm erosion on the left pubic area   - Hypertrophic scarring of right right pubic area     AN count (total sum of abscess and inflammatory nodule): 1    -sites not commented on demonstrate normal findings.     ______________________________________________________________________    Documentation assistance was provided by Marga Melnick, Scribe, on January 18, 2021 at 1:45 PM for Elsie Stain, MD    ----------------------------------------------------------------------------------------------------------------------  January 18, 2021 5:06 PM. Documentation assistance provided by the Scribe. I was present during the time the encounter was recorded. The information recorded by the Scribe was done at my direction and has been reviewed and validated by me.  ----------------------------------------------------------------------------------------------------------------------      (Approved Template 08/03/2020)

## 2021-01-18 ENCOUNTER — Ambulatory Visit: Admit: 2021-01-18 | Discharge: 2021-01-19 | Payer: PRIVATE HEALTH INSURANCE

## 2021-01-18 DIAGNOSIS — L209 Atopic dermatitis, unspecified: Principal | ICD-10-CM

## 2021-01-18 DIAGNOSIS — L732 Hidradenitis suppurativa: Principal | ICD-10-CM

## 2021-01-18 MED ORDER — TRIAMCINOLONE ACETONIDE 0.1 % TOPICAL CREAM
INTRAMUSCULAR | 2 refills | 0.00000 days | Status: CP
Start: 2021-01-18 — End: ?

## 2021-01-18 MED ORDER — DOXYCYCLINE HYCLATE 100 MG CAPSULE
ORAL_CAPSULE | 4 refills | 0.00000 days | Status: CP
Start: 2021-01-18 — End: ?

## 2021-01-18 MED ORDER — CLINDAMYCIN PHOSPHATE 1 % TOPICAL SOLUTION
Freq: Every day | TOPICAL | 11 refills | 0.00000 days | Status: CP
Start: 2021-01-18 — End: ?

## 2021-01-18 MED ADMIN — triamcinolone acetonide (KENALOG-40) injection 40 mg: 40 mg | INTRALESIONAL | @ 22:00:00 | Stop: 2021-01-18

## 2021-01-18 NOTE — Unmapped (Addendum)
Start triamcinolone cream instead of iodine   Continue on Humira as prescribed

## 2021-01-18 NOTE — Unmapped (Deleted)
Self-reported severity (0-5): 2  VAS pain today: 3  VAS average pain for the last month: 6  Requiring pain medication? No.  If so, what type/frequency? ***  How often in pain?  continuously  Level of odor (0-5): 2  Level of itching (0-5): 1  Dressing changes needed for drainage:Once a day  How much drainage: a little drainage  Flare in the last month (Y/N)? Yes.  How long ago was the last flare? in last 6 months  Developing new lesions? less than monthly  Number of inflammatory lesions montly: 1-3  DLQI: 16  Current treatment: HUMIRA     How helpful is the current treatment in managing the following aspects of your disease?  Not at all helpful Somewhat helpful Very helpful   Pain X     Decreasing length of flares   X   Decreasing new lesions  X    Drainage  X    Decreasing frequency of flares  X    Decreasing severity of flares  X    Odor  X

## 2021-01-21 NOTE — Unmapped (Signed)
Teresa Bowman reports her first Humira injections have gone well. She's had some slight improvement with decreased swelling of her existing HS areas.     She does report some mild injection site reaction after her injections - we discussed this is normal (redness, itching localized to injection site), but to let us know if this worsens over [facility]. I also offered she could apply a small amount of triamcinolone on the area to help with itching.    Maintenance dosing to start 3/15.     [facility] Clinical Assessment & Refill Coordination Note    Teresa Bowman, [place]: 1981-09-07  Phone: [phone] (home)     All above HIPAA information was verified with patient.     Was a Nurse, learning disability used for this call? No    Specialty Medication(s):   Inflammatory Disorders: Humira     Current Outpatient Medications   Medication Sig Dispense Refill   ??? celecoxib (CELEBREX) 200 MG capsule Take 1 capsule (200 mg total) by mouth Two (2) times a day. 60 capsule 5   ??? cetirizine (ZYRTEC) 10 mg cap Take 10 mg by mouth.     ??? clindamycin (CLEOCIN T) 1 % external solution Apply topically daily. 60 mL 11   ??? clobetasoL (TEMOVATE) 0.05 % ointment Twice daily as needed for spot treatment for up to one week on each spot 60 g 5   ??? doxycycline (VIBRAMYCIN) 100 MG capsule Twice daily for 2 weeks for flare 30 capsule 4   ??? DULoxetine (CYMBALTA) 60 MG capsule Take 1 capsule (60 mg total) by mouth daily. 30 capsule 5   ??? empty container Misc Use as directed to dispose of Humira Pens 1 each 3   ??? fluticasone propionate (FLONASE) 50 mcg/actuation nasal spray 2 sprays by Each Nare route two (2) times a day. 32 g 11   ??? HUMIRA PEN CITRATE FREE 40 MG/0.4 ML Inject the contents of 1 pen (40 mg total) under the skin every seven (7) days. 4 each 5   ??? HUMIRA PEN CITRATE FREE STARTER PACK FOR CROHN'S/UC/HS 3 X 80 MG/0.8 ML Inject the contents of 2 pens (160 mg) on day 1, THEN 1 pen (80 mg) on day 15. 3 each 0   ??? hydrOXYchloroQUINE (PLAQUENIL) 200 mg tablet Plaquenil 200 mg tablet   Take 1 po bid wiht food     ??? magnesium oxide 200 mg magnesium Chew magnesium     ??? melatonin 5 mg cap Take by mouth.     ??? multivitamin (THERAGRAN) per tablet Take 1 tablet by mouth daily.     ??? semaglutide 3 mg Tab Take 3 mg by mouth daily. 30 tablet 0   ??? spironolactone (ALDACTONE) 50 MG tablet TAKE 1 TABLET (50 MG TOTAL) BY MOUTH DAILY WITH FOOD. 90 tablet 1   ??? traMADoL (ULTRAM) 50 mg tablet Take 1 tablet (50 mg total) by mouth every six (6) hours. 10 tablet 0   ??? triamcinolone (KENALOG) 0.1 % cream Twice daily for up to 2 weeks for healing areas 80 g 2     No current facility-administered medications for this visit.        Changes to medications: Teresa Bowman.    Allergies   Allergen Reactions   ??? Naltrexone    ??? Penicillins Other (See Comments)   ??? Augmentin [Amoxicillin-Pot Clavulanate] Rash   ??? Ceclor [Cefaclor] Rash       Changes to allergies:  No    SPECIALTY MEDICATION ADHERENCE     Humira - 0 left  Medication Adherence    Patient reported X missed doses in the last month: 0  Specialty Medication: Humira          Specialty medication(s) dose(s) confirmed: Regimen is correct and unchanged.     Are there any concerns with adherence? No    Adherence counseling provided? Not needed    CLINICAL MANAGEMENT AND INTERVENTION      Clinical Benefit Assessment:    Do you feel the medicine is effective or helping your condition? Yes    Clinical Benefit counseling provided? Not needed    Adverse Effects Assessment:    Are you experiencing any side effects? Yes, patient reports experiencing injection reaction. Side effect counseling provided: monitoring, small amount of itching/redness is normal. can apply topical steroid PRN    Are you experiencing difficulty administering your medicine? No    Quality of Life Assessment:    How many days over the past month did your HS  keep you from your normal activities? For example, brushing your teeth or getting up in the morning. Patient declined to answer    Have you discussed this with your provider? Not needed    Therapy Appropriateness:    Is therapy appropriate? Yes, therapy is appropriate and should be continued    DISEASE/MEDICATION-SPECIFIC INFORMATION      For patients on injectable medications: Patient currently has 0 doses left.  Next injection is scheduled for 3/15.    PATIENT SPECIFIC NEEDS     - Does the patient have any physical, cognitive, or cultural barriers? No    - Is the patient high risk? No    - Does the patient require a Care Management Plan? No     - Does the patient require physician intervention or other additional services (i.e. nutrition, smoking cessation, social work)? No      SHIPPING     Specialty Medication(s) to be Shipped:   Inflammatory Disorders: Humira    Other medication(s) to be shipped: No additional medications requested for fill at this [facility]     Changes to insurance: No    Delivery Scheduled: Yes, Expected medication delivery date: Thurs, 3/10.     Medication will be delivered via UPS to the confirmed prescription address in [facility].    The patient will receive a drug information handout for each medication shipped and additional FDA Medication Guides as required.  Verified that patient has previously received a Conservation officer, historic buildings.    All of the patient's questions and concerns have been addressed.    Teresa Bowman   [facility] Specialty Pharmacist

## 2021-01-21 NOTE — Unmapped (Signed)
Prior authorization initiated on 01/21/2021 with/ from [facility], [facility] via [employer].    Medication: Rybelsus 3MG  tablets    Reference ID: Key:??[id number]      Status: in progress.    Pharmacy Notified: not at this time.    Patient Notified: not at this time.    Provider Notified: not at this time.    Clinical Information: MEB.

## 2021-01-26 NOTE — Unmapped (Signed)
Prior authorization received on 01/21/2021 with/ from [facility], [facility] via [employer].    Medication: Rybelsus 3MG  tablets      Reference ID: Key:??[id number]  Rx #: [id number]       Status: denied due to: Medication only FDA approved for type II diabetes treatment.    Pharmacy Notified: yes.    Patient Notified: yes.    Provider Notified: yes.    Clinical Information: MEB.

## 2021-01-27 MED FILL — HUMIRA PEN CITRATE FREE 40 MG/0.4 ML: SUBCUTANEOUS | 28 days supply | Qty: 4 | Fill #0

## 2021-01-29 ENCOUNTER — Ambulatory Visit
Admit: 2021-01-29 | Discharge: 2021-01-30 | Payer: PRIVATE HEALTH INSURANCE | Attending: Student in an Organized Health Care Education/Training Program | Primary: Student in an Organized Health Care Education/Training Program

## 2021-01-29 DIAGNOSIS — Z6841 Body Mass Index (BMI) 40.0 and over, adult: Principal | ICD-10-CM

## 2021-01-29 DIAGNOSIS — N3941 Urge incontinence: Principal | ICD-10-CM

## 2021-01-29 DIAGNOSIS — L732 Hidradenitis suppurativa: Principal | ICD-10-CM

## 2021-01-29 LAB — POTASSIUM: POTASSIUM: 4.1 mmol/L (ref 3.4–4.8)

## 2021-01-29 LAB — CREATININE
CREATININE: 0.59 mg/dL — ABNORMAL LOW
EGFR CKD-EPI AA FEMALE: >90 mL/min/{1.73_m2} (ref >=60)
EGFR CKD-EPI NON-AA FEMALE: >90 mL/min/{1.73_m2} (ref >=60)

## 2021-01-29 LAB — SODIUM: SODIUM: 136 mmol/L (ref 135–145)

## 2021-01-29 MED ORDER — METFORMIN ER 500 MG TABLET,EXTENDED RELEASE 24 HR
ORAL_TABLET | Freq: Every day | ORAL | 3 refills | 90.00000 days | Status: CP
Start: 2021-01-29 — End: 2022-01-29

## 2021-01-29 NOTE — Unmapped (Unsigned)
[facility] at [facility]     Type of visit:  face to face    Reason for visit: Follow up    General Consent to Treat (GCT) for non-epic video visits only: Verbal consent    Screening BP- 129/85      Allergies reviewed: Yes    Medication reviewed: Yes  Pended refills? No    HCDM reviewed and updated in Epic:    We are working to make sure all of our patients??? wishes are updated in Epic and part of that is documenting a Environmental health practitioner for each patient  A Health Care Decision Rodena Piety is someone you choose who can make health care decisions for you if you are not able ??? who would you most want to do this for you????  is already up to date.        BPAs completed:  PHQ2  AUDIT - Alcohol Screen  HARK - Interpersonal Violence      COVID-19 Vaccine Summary  Which COVID-19 Vaccine was administered  Pfizer  Type:  Dates Given:  08/19/2020            Immunization History   Administered Date(s) Administered   ??? COVID-19 VACC,MRNA,(PFIZER)(PF)(IM) 01/29/2020, 02/19/2020, 08/19/2020   ??? Covid-19 Vacc, Unspecified 01/29/2020, 02/19/2020   ??? Influenza Vaccine Quad (IIV4 PF) 12mo+ injectable 08/15/2018   ??? Influenza Virus Vaccine, unspecified formulation 08/21/2017, 08/08/2019   ??? PNEUMOCOCCAL POLYSACCHARIDE 23 11/19/2017       __________________________________________________________________________________________    SCREENINGS COMPLETED IN FLOWSHEETS    HARK Screening  HARK Screening  Within the last year, have you been humiliated or emotionally abused in other ways by your partner or ex-partner?: No  Within the last year, have you been afraid of your partner or ex-partner?: No  Within the last year, have you been raped or forced to have any kind of sexual activity by your partner or ex-partner?: No  Within the last year, have you been kicked, hit, slapped, or otherwise physically hurt by your partner or ex-partner?: No    AUDIT       PHQ2  PHQ-2 Total Score : 2    PHQ9          P4 Suicidality Screener                GAD7       COPD Assessment       Falls Risk

## 2021-01-29 NOTE — Unmapped (Signed)
Internal Medicine Clinic Visit    Reason for visit: ***    A/P:    {TIP - HCC- RAFF Pilot- Clinical Documentation Specialist Recommendations-  No specialty comments available.   This text will self delete upon signing note:[id number]}       No diagnosis found.    1. ***    2. ***    3. ***    No follow-ups on file.    Staffed with Dr. ***, {seen/discussed:75519}    __________________________________________________________    HPI:      She has been having issues with incontinence, this has gotten worse since her weight has increased. - she has been having to go to the bathroom frequently. She will have a sudden urge to go and she won't be able to make it to the bathroom.   __________________________________________________________    Problem List:  Patient Active Problem List   Diagnosis   ??? 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 Pain in thoracic spine at multiple sites   ??? Allergic rhinitis   ??? Morbid obesity (CMS-HCC)   ??? Anxiety   ??? Chronic pain syndrome   ??? Mild intermittent asthma without complication   ??? Vitamin D deficiency   ??? Mixed incontinence   ??? Anxiety, generalized       Medications:  Reviewed in EPIC  __________________________________________________________    Physical Exam:   Vital Signs:  Vitals:    01/29/21 1504   Pulse: 81   Resp: 16   Temp: 36.4 ??C   TempSrc: Temporal   SpO2: 99%   Weight: (!) 165.1 kg (364 lb)   Height: 167.6 cm (5' 6)       Gen: Well appearing, NAD  CV: RRR, no murmurs  Pulm: CTA bilaterally, no crackles or wheezes  Abd: Soft, NTND, normal BS. No HSM.  Ext: No edema  ***    PHQ-9 Score:     GAD-7 Score:       Medication adherence and barriers to the treatment plan have been addressed. Opportunities to optimize healthy behaviors have been discussed. Patient / caregiver voiced understanding. time. We tried to get her approved for Rybelsus but insurance wouldn't approve because deemed her intolerant to the medication. She has been on phentermine in the past, was effective but made her jittery. Topiramate caused GI upset. She can't recall if she's tried metformin, she thinks maybe she did but also caused GI upset.     She has been having issues with incontinence, this has gotten worse since her weight has increased. - she has been having to go to the bathroom frequently. She will have a sudden urge to go and she won't be able to make it to the bathroom.     Her goal weight is 320 lbs, that is when she felt better with her arthiritis and incontinence. She is joining a weight loss program through [facility] next month.   __________________________________________________________    Problem List:  Patient Active Problem List   Diagnosis   ??? 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 Pain in thoracic spine at multiple sites   ??? Allergic rhinitis   ??? Morbid obesity (CMS-HCC)   ??? Anxiety   ??? Chronic pain syndrome   ??? Mild intermittent asthma without complication   ??? Vitamin D deficiency   ??? Mixed incontinence   ??? Anxiety, generalized       Medications:  Reviewed in EPIC  __________________________________________________________    Physical Exam:  Vital Signs:  Vitals:    01/29/21 1504   Pulse: 81   Resp: 16   Temp: 36.4 ??C   TempSrc: Temporal   SpO2: 99%   Weight: (!) 165.1 kg (364 lb)   Height: 167.6 cm (5' 6)       Gen: Well appearing, NAD  CV: RRR, no murmurs  Pulm: CTA bilaterally, no crackles or wheezes  Abd: Soft, NTND, normal BS. No HSM.  Ext: No edema      PHQ-9 Score:     GAD-7 Score:       Medication adherence and barriers to the treatment plan have been addressed. Opportunities to optimize healthy behaviors have been discussed. Patient / caregiver voiced understanding.

## 2021-01-29 NOTE — Unmapped (Addendum)
-   start metformin XR 500 mg daily, increase by 500 mg every 2 weeks up to 2,000 mg as tolerated  - for your incontinence: I have placed a referral for pelvic floor PT, and attached information about Kegel exercises below, also recommend timing your voids (make sure you go to the bathroom on schedule every 2-3 hours)       Patient Education        Kegel Exercises: Care Instructions  Overview     Kegel exercises strengthen muscles around the bladder. These muscles control the flow of urine. Kegel exercises are sometime called pelvic floor exercises. They can help prevent urine leakage and keep the pelvic organs in place.  Kegel exercises can strengthen pelvic muscles that have been weakened by age, pregnancy, childbirth, and surgery. They may help prevent or treat urine leakage.  You do Kegel exercises by tightening the muscles you use when you urinate. You will likely need to do these exercises for several weeks to get better.  Follow-up care is a key part of your treatment and safety. Be sure to make and go to all appointments, and call your doctor if you are having problems. It's also a good idea to know your test results and keep a list of the medicines you take.  How can you care for yourself at home?  ?? Do Kegel exercises.  ? Find the muscles you need to strengthen. To do this, tighten the muscles that stop your urine while you are going to the bathroom. These are the same muscles you squeeze during Kegel exercises.  ? Squeeze the muscles as hard as you can. Your belly and thighs should not move.  ? Hold the squeeze for 3 seconds. Then relax for 3 seconds.  ? Start with 3 seconds, and then add 1 second each week until you are able to squeeze for 10 seconds.  ? Repeat the exercise 10 to 15 times for each session. Do three or more sessions each day.  ?? You can check to see if you are using the right muscles.  ? Tighten the muscles that help you stop passing gas or keep you from urinating. Make sure you aren't using your stomach, leg, or buttock muscles.  ? Place a finger in your vagina and squeeze around it. You are doing them right when you feel pressure around your finger. Your doctor may also suggest that you put special weights in your vagina while you do the exercises.  ?? Check with your doctor if you don't notice a difference after trying these exercises for several weeks. Your doctor may suggest getting help from a physical therapist or recommend other treatment.  Where can you learn more?  Go to [facility] at [email or web address] forces logistics/[email or web address] officer in the Menu. Enter [phone] in the search box to learn more about Kegel Exercises: Care Instructions.  Current as of: January 01, 2020??????????????????????????????Content Version: 13.1  ?? 2006-2021 [employer].   Care instructions adapted under license by [facility]. If you have questions about a medical condition or this instruction, always ask your healthcare professional. [employer] Inc[employer] disclaims any warranty or liability for your use of this information.

## 2021-01-31 NOTE — Unmapped (Signed)
Addended by: Jeffie Pollock F on: 01/31/2021 02:09 PM     Modules accepted: Level of Service

## 2021-02-04 NOTE — Unmapped (Unsigned)
Nitya Cauthon is a 40 y.o. female seen in consultation requested by Dr. Kathrin Ruddy  for eval of No chief complaint on file.       [facility]    Initial History of present illness:       Assessment and plan:           Copy to :   Referring provider   Primary Care Provider Kathrin Ruddy, MD    INTERPRETATION EXTENDED OPHTHALMOSCOPY MACULA (90Dlens)  Macula - right:  No value filed.  Macula - left:  No value filed.         02/04/2021   I saw and evaluated the patient, participating in the key portions of the service.  I reviewed the resident???s note.  I agree with the resident???s findings and plan including interpretation of images.

## 2021-02-05 ENCOUNTER — Ambulatory Visit: Admit: 2021-02-05 | Discharge: 2021-02-06 | Payer: PRIVATE HEALTH INSURANCE

## 2021-02-05 NOTE — Unmapped (Signed)
Plaquenil Information    What Is Plaquenil?      Plaquenil (Hydroxychloroquine sulfate) is a drug used to treat certain autoimmune diseases. This type of disease happens when the body's immune system attacks its own healthy tissue. Some diseases treated with Plaquenil are:  Lupus, which causes fever, rashes, skin problems, and other symptoms.  Rheumatoid arthritis, a condition that causes pain and swelling in the joints of your hands and feet.  Sj??gren???s syndrome, which causes dry eyes and dry mouth.  Plaquenil lowers your immune system???s ability to cause inflammation. This can help control symptoms like rashes, skin and mouth sores and joint pain.    Plaquenil and your eyes  A rare side effect of Plaquenil is damage to the eye's retina. The retina is the light-sensitive tissue at the back of the eye. Using Plaquenil for a long period of time may harm the retina, causing serious vision loss.  People with retinal damage from Plaquenil are not aware at first that they are losing vision. Unfortunately, vision loss from Plaquenil can be permanent. If you take Plaquenil, it is very important to see an ophthalmologist regularly. Your ophthalmologist will check your retina for problems before serious damage occurs.  Who is at risk for vision loss with Plaquenil?  Some people who take Plaquenil have a higher risk of retinal damage than others. They include:  people taking Plaquenil for 5 or more years  people taking Plaquenil at higher doses than recommended  people who already have retinal disease  people who already have kidney or liver disease  people over 60 years old  people who lose a lot of weight while taking Plaquenil. Tell your doctor if this happens as your dose may need to be lowered.  When should you see an ophthalmologist when taking Plaquenil?  If you take Plaquenil, here is when to see an ophthalmologist:  Before Plaquenil treatment begins. You will have a ???baseline??? eye exam. This measures the health of your eyes and looks for retinal or macular disease.  Once a year while taking Plaquenil.  After five years of Plaquenil treatment, when retina damage is likely to begin, you need to see your ophthalmologist every six months. Special tests can help find early damage to the retina before serious problems develop.  How do ophthalmologists look for Plaquenil damage?  Your ophthalmologist may use these tests to look for retina damage from Plaquenil:  Visual field test: During this test you sit down and look into a bowl-like machine. Small lights flash at different points inside the bowl. When you see the lights, you press a button. This test can help find if blind spots have developed in your field of vision.  OCT imaging: This test makes a detailed, three-dimensional image of your eye. Your ophthalmologist can use this image to look for early retinal damage.  Multifocal ERG looks at cells in the retina called rods and cones. The test measures how well these cells respond to light. It can find retinal damage from Plaquenil.  Photos: A special camera takes pictures of the retina. Photos show damaged areas as small spots of light.        If you take Plaquenil, it is important to see an ophthalmologist while you take this medicine.  What happens if retinal damage is found?  If your ophthalmologist finds any signs of retinal damage, he or she will tell your primary care doctor or inflammatory disease specialist to stop Plaquenil treatment immediately. This will help you avoid   permanent vision loss. Your other doctor will find another treatment for your disease.      For more information see   https://www.aao.org/eye-health/drugs/what-is-plaquenil

## 2021-02-18 NOTE — Unmapped (Signed)
[facility] Refill Coordination Note    Specialty Medication(s) to be Shipped:   Inflammatory Disorders: Humira    Other medication(s) to be shipped: No additional medications requested for fill at this time     Teresa Bowman, DOB: [birth year]  Phone: [phone] (home)       All above HIPAA information was verified with patient.     Was a Nurse, learning disability used for this call? No    Completed refill call assessment today to schedule patient's medication shipment from the [facility] [phone]).       Specialty medication(s) and dose(s) confirmed: Regimen is correct and unchanged.   Changes to medications: Amyrie reports no changes at this time.  Changes to insurance: No  Questions for the pharmacist: No    Confirmed patient received a Conservation officer, historic buildings and a Surveyor, mining with first shipment. The patient will receive a drug information handout for each medication shipped and additional FDA Medication Guides as required.       DISEASE/MEDICATION-SPECIFIC INFORMATION        For patients on injectable medications: Patient currently has 1 doses left.  Next injection is scheduled for 02/23/2021.    SPECIALTY MEDICATION ADHERENCE     Medication Adherence    Patient reported X missed doses in the last month: 0  Specialty Medication: Humira CF 40 mg/0.4 ml   Patient is on additional specialty medications: No  Any gaps in refill history greater than 2 weeks in the last 3 months: no  Demonstrates understanding of importance of adherence: yes  Informant: patient  Reliability of informant: reliable  Confirmed plan for next specialty medication refill: delivery by pharmacy  Refills needed for supportive medications: not needed                      SHIPPING     Shipping address confirmed in Epic.     Delivery Scheduled: Yes, Expected medication delivery date: 02/26/2021.     Medication will be delivered via UPS to the prescription address in Epic WAM.    Luetta Piazza D James Lafalce   [facility] Specialty Technician

## 2021-02-23 ENCOUNTER — Ambulatory Visit: Admit: 2021-02-23 | Discharge: 2021-02-24 | Payer: PRIVATE HEALTH INSURANCE

## 2021-02-23 MED ORDER — HYDROCODONE 5 MG-ACETAMINOPHEN 325 MG TABLET
ORAL_TABLET | Freq: Four times a day (QID) | ORAL | 0 refills | 3.00000 days | Status: CP | PRN
Start: 2021-02-23 — End: ?

## 2021-02-23 NOTE — Unmapped (Signed)
Managing Your Wound After Skin Surgery  ? Please avoid strenuous activity for 48 hours and all heavy exercise for one week.  o If your wound is on your arm, leg, or shoulder, then avoid heavy lifting, straining, or exercise with that limb for at least one week.  o If your wound is on the head or neck, avoid bending over and sleep with your head elevated for 48 hours to reduce the risk of bleeding.  o Please do not smoke for 3 weeks; smoking prevents healthy wound healing.  ? Pain Management: Use 650mg  of acetaminophen (Tylenol) every 4 hours, or medication that we prescribe as needed.  Pain should decrease steadily for the first few days after your surgery.  o Avoid ibuprofen (Motrin), naproxen (Aleve), aspirin, or alcohol for 48 hours because they can increase the risk of bleeding.  You should continue aspirin if you already take it regularly.  ? Possible complications:  o Bleeding: A small amount of blood on the bandage is normal.  For significant bleeding into the bandage or beneath the stitches (this will look like swelling and purple discoloration), apply firm pressure with a cloth or bandage for 20 minutes without interruption.  Repeat if bleeding continues.  If it persists, please call [phone].  During non-office hours use the message at this number to contact the on-call dermatologist or go to the emergency room.  o Infection:  Usually indicated by pain that increases 4-6 days after surgery. Mild redness, swelling, and soreness are normal, but with increasing redness, pain, drainage, and swelling you should contact our office.  o Decreased sensation, increased sensitivity, and itching may last for 18 months.  o Scarring: Scars mature for one year after surgery.  Reducing motion and tension over the wound for the first month, as well as sun-protection, reduces scarring.    o Bruising is normal around the wound and resolves over 2-3 weeks.  For Wounds with Dissolving Stitches:  ? Please keep the original bandage in place for at least 24 hours.  You can remove the large outer layer at that time and keep the smaller bandage clean and dry for one week or until you return.    ? If the bandage becomes soiled, wet, or detached you can reinforce it with tape or add petrolatum (Vaseline) to the stitches, then place a fresh, clean, non-stick bandage (ie Telfa) with tape or Band-aid.  For Wounds with Stitches that Need to be Removed:  ? Please keep the original bandage in place for at least 24 hours.  You can remove the large outer layer at that time.  You may leave the smaller bandage in place until it falls off by itself, or change it after it gets wet with bathing.  To change, apply a thick layer of petrolatum (Vaseline) over top of the Steri-strips (thin white strips) and cover with a non-stick bandage (ie Telfa) with tape or a  Band-aid.  If you are changing the bandage you can gently remove any crust with warm water and mild soap using a soft cloth or Q-tip.  Do not use triple-antibiotic ointment, Neosporin, or hydrogen peroxide.    For open wounds:  Each day or each time the dressing is soiled or disrupted, gently remove any excess crusting or oozing with a warm wet wash cloth or gently allow water to run over the area. Apply a thick layer of Vaseline, and then re-cover the area with a non-stick bandage and tape or another bandage of your  choosing. The thick layer of Vaseline will help soothe the pain of the wound and allow it to heal faster.    If crusting builds up a warm washcloth with mild soap or a mixture of 1 tablespoon of white vinegar in a cup of warm can be applied to the area for 5-10 minutes and then used to gently wipe away any excess.    Healing is best when the wound fills in from the bottom and sides. If the edges of the wound are close together it is important to keep the Vaseline in the wound and use the non-stick dressing pressed gently into the wound to keep the edges from sealing together first.    After the Stitches and Bandages Have Been Removed:  Clean daily with warm water and gentle soap using a soft cloth.  You can continue to use a small bandage and sunscreen for 1-2 months or until fully healed.

## 2021-02-23 NOTE — Unmapped (Signed)
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 Site and procedure verified with patient/physician agreement and clinical photograph. Two Health Care Workers involved in verification. Consent form signed at 4:10 PM. Personnel present during procedure: Milda Smart, MD, Jarome Matin, MD, Roderick Pee. The size and nature of the procedure as well as typical scarring were discussed along with (among others) the risks of bleeding, infection and lesion recurrence.    Unroofing procedure:  Lidocaine 0.5% with epinephrine was infiltrated at the site with a total of 15 mL. The surgical site was marked, cleansed with chlorhexidine, and draped in a sterile fashion. The fistula probe and/or forceps was used to delineate the involved area. Excision and beveling was performed with scissor or blade as appropriate to assure second intention healing.  Hemostasis was obtained in the usual fashion with pressure and drysol solution. Total size of wound: 2.5 x 2.0 cm. The area was dressed with petrolatum and bandage. Wound care instructions given.

## 2021-02-24 NOTE — Unmapped (Signed)
I saw and evaluated the patient, participating in the key elements of the service.  I discussed the findings, assessment and plan with the resident and agree with resident’s findings and plan as documented in the resident's note.  I was immediately available for the entirety of the procedure(s) and present for the key and critical portions. Dannisha Eckmann J Breanne Olvera, MD

## 2021-02-25 MED FILL — HUMIRA PEN CITRATE FREE 40 MG/0.4 ML: SUBCUTANEOUS | 28 days supply | Qty: 4 | Fill #1

## 2021-02-25 NOTE — Unmapped (Signed)
Result Note: Left note on pt's MyChart with bx results from deroofing showing HS No further treatment necessary.    Tracking:No.  Patient notified: MYCHART    Diagnosis       Date                     Value               Ref Range           Status                02/23/2021                                                       Final             Left groin, excision   -Consistent with hidradenitis suppurativa in a compatible clinical    context.   Specimen type: Excision   Cystically dilated follicles: Not identified    Folliculitis: Not identified    Abscess: Present   Chronic inflammation: Present    Lymphocytes: Present    Plasma cells: Present    Foreign body histiocytes: Present   Sinus tract granulation tissue: Present   Sinus tract lined by squamous epithelium: Present   Fibrosis: Present    Hypertrophic scar: Not identified     Keloid: Not identified    Granulomas (sarcoid - like): Not identified    Hidradenitis - neutrophils in apocrine / eccrine glands: Not identified    Epidermal hyperplasia: Present [FORMATTING REMOVED]  ----------

## 2021-03-09 MED ORDER — METFORMIN ER 500 MG TABLET,EXTENDED RELEASE 24 HR
ORAL_TABLET | Freq: Every day | ORAL | 3 refills | 90 days
Start: 2021-03-09 — End: 2022-03-09

## 2021-03-10 DIAGNOSIS — M1990 Unspecified osteoarthritis, unspecified site: Principal | ICD-10-CM

## 2021-03-10 NOTE — Unmapped (Signed)
Pharmacy request refill. 

## 2021-03-19 NOTE — Unmapped (Signed)
[facility] Refill Coordination Note    Specialty Medication(s) to be Shipped:   Inflammatory Disorders: Humira    Other medication(s) to be shipped: No additional medications requested for fill at this time     Teresa Bowman, DOB: 03/29/1981  Phone: [phone] (home)       All above HIPAA information was verified with patient.     Was a Nurse, learning disability used for this call? No    Completed refill call assessment today to schedule patient's medication shipment from the [facility] [phone]).  All relevant notes have been reviewed.     Specialty medication(s) and dose(s) confirmed: Regimen is correct and unchanged.   Changes to medications: Crislyn reports no changes at this time.  Changes to insurance: No  New side effects reported not previously addressed with a pharmacist or physician: None reported  Questions for the pharmacist: No    Confirmed patient received a Conservation officer, historic buildings and a Surveyor, mining with first shipment. The patient will receive a drug information handout for each medication shipped and additional FDA Medication Guides as required.       DISEASE/MEDICATION-SPECIFIC INFORMATION        For patients on injectable medications: Patient currently has 1 doses left.  Next injection is scheduled for 03/23/2021.    SPECIALTY MEDICATION ADHERENCE     Medication Adherence    Patient reported X missed doses in the last month: 0  Specialty Medication: Humira CF 40 mg/0.4 ml  Patient is on additional specialty medications: No  Any gaps in refill history greater than 2 weeks in the last 3 months: no  Demonstrates understanding of importance of adherence: yes  Informant: patient  Reliability of informant: reliable  Confirmed plan for next specialty medication refill: delivery by pharmacy  Refills needed for supportive medications: not needed              Were doses missed due to medication being on hold? No    Humira CF 40/0.4 mg/ml: 7 days of medicine on hand REFERRAL TO PHARMACIST     Referral to the pharmacist: Not needed      [facility]     Shipping address confirmed in Epic.     Delivery Scheduled: Yes, Expected medication delivery date: 03/24/2021.     Medication will be delivered via UPS to the prescription address in Epic WAM.    Teresa Bowman   [facility] Specialty Technician

## 2021-03-23 MED FILL — HUMIRA PEN CITRATE FREE 40 MG/0.4 ML: SUBCUTANEOUS | 28 days supply | Qty: 4 | Fill #2

## 2021-04-13 NOTE — Unmapped (Signed)
Left message for patient to discuss refill request for Flonase    Flonase was discontinued by Kathrin Ruddy, MD on 01/29/2021 15:50. It also looks like patient now sees [facility]    Direct number provided

## 2021-04-16 NOTE — Unmapped (Signed)
[facility] Refill Coordination Note    Specialty Medication(s) to be Shipped:   Inflammatory Disorders: Humira    Other medication(s) to be shipped: No additional medications requested for fill at this time     Teresa Bowman, DOB: [birth year]  Phone: [phone] (home)       All above HIPAA information was verified with patient.     Was a Nurse, learning disability used for this call? No    Completed refill call assessment today to schedule patient's medication shipment from the [facility] [phone]).  All relevant notes have been reviewed.     Specialty medication(s) and dose(s) confirmed: Regimen is correct and unchanged.   Changes to medications: Lema reports no changes at this time.  Changes to insurance: No  New side effects reported not previously addressed with a pharmacist or physician: None reported  Questions for the pharmacist: No    Confirmed patient received a Conservation officer, historic buildings and a Surveyor, mining with first shipment. The patient will receive a drug information handout for each medication shipped and additional FDA Medication Guides as required.       DISEASE/MEDICATION-SPECIFIC INFORMATION        For patients on injectable medications: Patient currently has 1 doses left.  Next injection is scheduled for 04/20/21.    SPECIALTY MEDICATION ADHERENCE     Medication Adherence    Patient reported X missed doses in the last month: 0  Specialty Medication: Humira CF 40 mg/0.4 ml  Patient is on additional specialty medications: No  Patient is on more than two specialty medications: No              Were doses missed due to medication being on hold? No    Humira 40mg / 0.4ml: 4 days of medicine on hand     REFERRAL TO PHARMACIST     Referral to the pharmacist: Not needed      [facility]     Shipping address confirmed in Epic.     Delivery Scheduled: Yes, Expected medication delivery date: 04/22/21.     Medication will be delivered via UPS to the prescription address in Epic WAM.    Nancy Nordmann [facility] Specialty Technician

## 2021-04-20 ENCOUNTER — Ambulatory Visit: Admit: 2021-04-20 | Discharge: 2021-04-21 | Payer: PRIVATE HEALTH INSURANCE

## 2021-04-20 DIAGNOSIS — D492 Neoplasm of unspecified behavior of bone, soft tissue, and skin: Principal | ICD-10-CM

## 2021-04-20 NOTE — Unmapped (Signed)
Dermatology Note     Assessment and Plan:      Hidradenitis Suppurativa, improving but not at goal   - Healing well from deroofing of right mons lesion with some residual hypertrophic scarring   - based on discussion above and r/b/a reviewed, mutual decision to proceed with the following   - Continue Humira 40 mg weekly   - Continue spironolactone at 75 mg daily   - Use triamcinolone 0.1% cream as needed for flares   - Will treat two residual lesions on the right labium majus with punch removals, as below  - Discussed option for ILK in areas of hypertrophic scarring from prior removal; patient defers at this time    Punch Excision Procedure Note:  Site A: Right medial labium majus; Site B: Right lateral labium majus  Ddx: Cyst vs HS vs other  - To excise entire lesion and confirm diagnosis, excision was performed today using punch tool technique.   - After verbal informed consent was obtained, the site was cleansed with alcohol and anesthetized with 2% lidocaine with epinephrine. Excision was performed with an 6 mm punch tool, down to subcutaneous fat, and hemostasis was achieved with electrocautery and gel foam. Petrolatum and a bandage were applied to the wound. The patient tolerated the procedure well, and post-care instructions were given. The specimen was sent for pathologic evaluation and the patient will be called with results.    High risk medication use (Humira)   - Hepatitis and Quantiferon TB up to date (12/22/2020)    Hypopigmented patches on the thighs at the site of prior ILK  - Discussed that this is a side effect of the ILK injection  - Reassure that this will likely improve with time  - Will attempt lower concentrations in the future if ILK is needed for an HS flar    The patient was advised to call for an appointment should any new, changing, or symptomatic lesions develop.     RTC: Return in about 3 months (around 07/21/2021) for hs. or sooner as needed _________________________________________________________________      Chief Complaint     Follow-up of HS    HPI     Teresa Bowman is a 40 y.o. female who presents as a returning patient (last seen 02/23/2021) to [facility] for follow-up of hidradenitis suppurativa.     She reports that overall her HS is doing well with Humira 40mg  every week and spironolactone 75mg  daily. She is taking doxycycline to pre-treat for her procedure today. She notes that she has two lesions on her right labia that have been present for over a year. One drained about a year ago, but the other has not drained.    The patient denies any other new or changing lesions or areas of concern.     Pertinent Past Medical History     HS   Autoimmune arthropathy     Past Medical History, Family History, Social History, Medication List, Allergies, and Problem List were reviewed in the rooming section of Epic.     ROS: Other than symptoms mentioned in the HPI, no fevers, chills, or other skin complaints    Physical Examination     Gen: Well-appearing patient, appropriate, interactive, in no acute distress  Skin: Per patient request, examination of the groin and bilateral lower legs was performed and pertinent for:     location Abscess Inflamed nodule Non-inflamed nodule Draining sinus Non-draining Sinus Hurley BSA at site Color change  Inflamed induration  Open skin surface  Tunnels   R axilla              L axilla              R inframammary              L inframammary              Intermammary              Pubic              R inguinal              R thigh              L inguinal              L thigh              Scrotum/Vulva              Perianal              R buttock              L buttock              Other (list)                            *0=none, 1=mild, 2=moderate, 3=severe    - two small non-inflamed subcutaneous nodules on the right labium majus  - Hypertrophic scarring of right and left mons pubis  -sites not commented on demonstrate normal findings.

## 2021-04-20 NOTE — Unmapped (Signed)
Punch biopsy  Punch biopsy involves numbing a small area of your skin, then obtaining a sample to help [place] make a proper diagnosis of your skin condition. The biopsy site is typically closed with one to 3 small stitches to help the site heal. Biopsy results will usually return in 7-14 days.    To care for the area: Leave the bandage in place until the morning after your procedure is performed. On a daily basis, carefully remove the bandage, then shower or wash as usual. Allow water to run over the site. Please do not scrub. Carefully dry the area, then apply ointment (some people develop an allergy to Neosporin, so we recommend Vaseline or Aquaphor). Cover the site with a fresh bandage. Should any bleeding occur, apply firm pressure for 15 minutes. The treated site will heal best if  a scab never forms (the wound heals by new skin cells traveling from the outside toward the middle-their journey is easier if no scab stands in their way).    Long-term care: the site will be more sensitive than your surrounding skin. Keep it covered, and remember to apply sunscreen every day to all your exposed skin. A scar may remain which is lighter or pinker than your normal skin. Your body will continue to improve your scar for up to one year.    Infection following this procedure is rare. However, if you are worried about the appearance of your site, contact your doctor. Complete healing of the site may take up to one month. We have a physician on call at all times. If you have any concerns about the site, please call our clinic at [phone]

## 2021-04-21 MED FILL — HUMIRA PEN CITRATE FREE 40 MG/0.4 ML: SUBCUTANEOUS | 28 days supply | Qty: 4 | Fill #3

## 2021-04-21 NOTE — Unmapped (Signed)
I saw and evaluated the patient, participating in the key elements of the service.  I discussed the findings, assessment and plan with the resident and agree with resident’s findings and plan as documented in the resident's note.  I was immediately available for the entirety of the procedure(s) and present for the key and critical portions. Meredeth Furber J Ritamarie Arkin, MD

## 2021-04-26 NOTE — Unmapped (Signed)
04/26/21 - Informed patient of her benign results via mychart. LNC    A:  Right medial labium majora, punch  -Epidermoid cyst  Tracking? No??    B:  Right lateral labium majora, punch  -Ruptured hair follicle with suppurative inflammation and granulomatous response with giant cells  -Focal Kenalog  Tracking? No

## 2021-05-03 ENCOUNTER — Ambulatory Visit: Admit: 2021-05-03 | Payer: PRIVATE HEALTH INSURANCE

## 2021-05-07 NOTE — Unmapped (Signed)
Patient Notes- Dr. Janyth Contes Please Review

## 2021-05-11 ENCOUNTER — Ambulatory Visit: Admit: 2021-05-11 | Discharge: 2021-05-12 | Payer: PRIVATE HEALTH INSURANCE

## 2021-05-11 DIAGNOSIS — L732 Hidradenitis suppurativa: Principal | ICD-10-CM

## 2021-05-11 MED ADMIN — triamcinolone acetonide (KENALOG) injection 10 mg: 10 mg | INTRALESIONAL | @ 18:00:00 | Stop: 2021-05-11

## 2021-05-11 NOTE — Unmapped (Signed)
Dermatology Note     Assessment and Plan:      Hidradenitis Suppurativa, Hurley stage I  - Discussed chronic, relapsing nature of disease and treatment options, including antibiotics, immune modulators, surgery, and laser hair removal.  - Continue Humira 40 mg weekly.  - Continue spironolactone at 75 mg daily alternating with 100 mg daily (had issues with spotting with increase to 100 mg daily)  - Continue triamcinolone 0.1% cream as needed for flares.  - Discussed treatment options including punch excision vs ILK, but favoring ILK given the lesions on R labium majora appear to be improving hypertrophic scars from recent punch excisions rather than a persistent inflammatory nodule or sinus tract and the acutely inflamed lesion on the L labium majora has been present for only a short while.    Intralesional Kenalog Procedure Note: After the patient was informed of risks (including atrophy and dyspigmentation), benefits and side effects of intralesional steroid injection, the patient elected to undergo injection and verbal consent was obtained. Skin was cleaned with alcohol and injected intralesionally into the sites (below). The patient tolerated the procedure well without complications and was instructed on post-procedure care.  Location(s): R labium majora and L labium majora  Number of sites treated: 2  Kenalog (triamcinolone) Concentration: 10 mg/ml (lower concentration chosen due to hypopigmentation from ILK 40 in the past)  Volume: 0.4 ml total (between 2 sites)      High risk medication use (Humira)   - Hepatitis and Quantiferon TB up to date (12/22/2020)    Hypopigmented patches on the thighs at the site of prior ILK  - Previously discussed that this is a side effect of the ILK injection  - Reassured that this will likely improve with time  - Will attempt lower concentrations in the future if ILK is needed for an HS flare      The patient was advised to call for an appointment should any new, changing, or symptomatic lesions develop.     RTC: Return in about 3 months (around 08/11/2021) for HS. or sooner as needed   _________________________________________________________________      Chief Complaint     Follow-up of HS    HPI     Teresa Bowman is a 40 y.o. female who presents as a returning patient (last seen 04/20/2021) to [facility] for follow-up of hidradenitis suppurativa.     At last visit, punch excision performed to R medial labium majus and R lateral labium majus but one of the spots never healed/improved. She also reports a new inflammatory nodule on the L side. She continues on Humira 40 mg weekly and was able to increase Spironolactone to 75 mg/100 mg alternating. She had spotting with a direct increase to 100 mg alternating, thus has increased cautiously. She has also been using Triamcinolone cream PRN for flares.    Severity today: 3  Pain today: 4  Pain average over last month: 4  How often in pain: Continuously  Requiring pain medication? not answered   If so, what type/frequency? N/A  Odor today: 3  Itching today: 2  Frequency of bandage changes? no drainage or less than weekly  Drainage: None  >25% worsening in last 3 months? NO   If no, when was it last markedly worse? within last 6 months  How often getting new lesions? less than monthly  Number of inflammatory lesions monthly: 1-3    DLQI: 11    Any new health conditions since last visit? none  How helpful is the current treatment in managing the following aspects of your disease?  Pain: Very helpful  Decreasing length of flares: Very helpful  Decreasing new lesions: Somewhat helpful  Drainage: Very helpful  Decreasing frequency of flares: Very helpful  Decreasing severity of flares: Very helpful  Odor: Very helpful      The patient denies any other new or changing lesions or areas of concern.     Pertinent Past Medical History     HS   Autoimmune arthropathy     Past Medical History, Family History, Social History, Medication List, Allergies, and Problem List were reviewed in the rooming section of Epic.     ROS: Other than symptoms mentioned in the HPI, no fevers, chills, or other skin complaints    Physical Examination     Gen: Well-appearing patient, appropriate, interactive, in no acute distress  Skin: Per patient request, examination of the groin and bilateral lower legs was performed and pertinent for:     location Abscess Inflamed nodule Non-inflamed nodule Draining sinus Non-draining Sinus Hurley BSA at site Color change  Inflamed induration Open skin surface  Tunnels   R axilla              L axilla              R inframammary              L inframammary              Intermammary              Pubic              R inguinal              R thigh              L inguinal              L thigh              Scrotum/Vulva  1            Perianal              R buttock              L buttock              Other (list)                            *0=none, 1=mild, 2=moderate, 3=severe    - Two small pink well-healed scars on R labia majora  - Sites not commented on demonstrate normal findings.

## 2021-05-12 NOTE — Unmapped (Signed)
You are welcome to join the [facility] for HS of the [employer] group online at [email or web address] or in-person at our regular meetings.  You can textHS to [phone] for meeting reminders or join the group online for regular updates.  This can be a great opportunity to interact and learn from other patients and help work with the HS community.  We hope to see your there!    Hidradentis Suppurativa (pronounced ???high-drad-en-eye-tis/sup-your-uh-tee-vah???) is a chronic disease of hair follicles.  The lesions occur most commonly on areas of skin-to-skin contact: under the arms (axillary area), in the groin, around the buttocks, in the region around the anus and genitals, and on the skin between and under the breasts. In women, the underarms, groin, and breast areas are most commonly affected. Men most often have HS lesions around the anus and under the arms and may also have HS at the back of the neck and behind and around the ears.    What does HS look and feel like?   The first thing that someone with HS notices is a tender, raised, red bump that looks like an under-the-skin pimple or boil. Sometimes HS lesions have two or more ???heads.???  In mild disease only an occasional boil or abscess may occur, but in more active disease there can be many new lesions every month.  Some abscesses can become larger and may open and drain pus.  Bleeding and increased odor can also occur. In severe disease, deeper abscesses develop and may connect with each other under the skin to form tunnel-like tracts (sinuses, fistulas).  These may drain constantly, or may temporarily improve and then usually begin draining again over time.  In people who have had sinus tracts for some time, scars form that feel like ropes under the skin. In the very worst cases, networks of sinus tracts can form deeper in the body, including the muscle and other tissues. Many people with severe HS have scars that can limit their ability to freely move their arms or legs, though this is very unlikely for most patients.     Clinicians usually classify or ???grade??? HS using the [facility] staging system according to the severity of the disease for each body location:  ??? Hurley stage I: one or more abscesses are present, but no sinus tracts have formed and no scars have developed  ??? Hurley stage II: one or more abscesses are present that resolve and recur; on sinus tract can be present and scarring is seen  ??? Hurley stage III: many abscesses and more than one sinus tract is present with extensive scars.    What causes HS?  The cause of HS is not completely understood.  It seems to be a disorder of hair follicles and often many family members are affected so genetics probably play a strong role.  Bacteria are often present and may make the disease worse, but infection does not seem to be the main cause. Hormones are also likely play a role since the condition typically starts around puberty when hair follicles under the arms and in the groin start to change.  It can sometimes flare with menstrual cycles in women as well.  In most cases it lasts for decades and starts to improve to some extent in the late 30s and 40s as long as many fistulas have not already formed.  Women are three times more likely than men to develop HS.    Other factors are known to contribute to HS  flaring or becoming worse, though they are likely not the main causes. The factors most commonly associated with HS include:  ??? Cigarette smoking - this is very highly linked.  Stopping smoking will likely not cure the disease, but likely is helpful in reducing how much and how often it flares.  ??? Obesity - HS may occur even in people that are not overweight, but it is much more common in patients that are.  There is some evidence that losing weight and eating a diet low in sugars and fats may be helpful in improving hidradenitis, though this is not helpful for everyone.  Working with a nutritionist may be an important way to help with this and is something your physician can help coordinate    Hidradenitis is not contagious.  It is not caused by a problem with personal hygiene or any other activity or behavior of those with the disease.    How can your doctor help you treat your hidradenitis?  Clinicians use both medication and surgery to treat HS. The choice of treatment???or combination of treatments???is made according to an individual patient???s needs. Clinicians consider several factors in determining the most appropriate plan for therapy:  ??? Severity of disease - medications and some laser treatments are usually able to control disease best when fistulas are not present.  Fistulas typically require surgery.  ??? Extent and location of disease  ??? Chronicity (how often the lesions recur)    A number of different surgical methods have been developed that are useful for certain patients under particular circumstances. These can be done with local numbing and healing at home for some areas when disease is not too extensive with relatively brief recovery times.  In more extensive disease there may be a need for larger excisions under general anesthesia with healing time in the hospital and prolonged recovery periods for better disease control.      In addition, many medical treatments have been tried???some with more success than others. No medication is effective for all patients, and you and your doctor may have to try several different agents or combinations of agents before you find the treatment plan that works best for you.  The goals of therapy with medications that are either topical (used on the skin) or systemic (taken by mouth) are:  1. to clear the lesions or at least reduce their number and extent, and  2. to prevent new lesions from forming.  3. To reduce pain, drainage, and odor  Some of the types of medications commonly used are antibacterial skin washes and the topical antibiotics to prevent secondary infections and corticosteroid injections into the lesions to reduce inflammation.     Other medications that may be used include retinoids (similar to Accutane), drugs that effect how hormones and hair follicles interact, drugs that affect your immune system (such as methotrexate, adalimumab/Humira, and Remicaid/infliximab), steroids, and oral antibiotics.    Lasers that destroy hair follicles can also be helpful since they reduce the hair follicles that cause the problems.  Multiple treatments are typically required over time and there is some discomfort associated with treatment, but it is typically very fast and well-tolerated.    It is very important to realize that hidradenitis cannot be completely cured with any single medication or surgical procedure.  It is a disease that can be very stubborn and difficult to control, but with good treatment a lot of improvement and sometimes temporary remissions can be obtained. Poorly controlled disease can cause  more fistulas to form and make managing the disease much more difficult over time so it is important to seek care to reduce major flares.  Surgery can provide a long term cure in some areas, though the disease can start again or continue in nearby areas.  A dermatologist is often the best person to help coordinate disease treatment, and sometimes other surgeons, pain specialists, other specialists, and nutritionists may be part of the treatment team.    What can you do to help your HS?  1. If your are a smoker, then stopping can probably be helpful.  Your dermatologist will be happy to refer you to some one who can help with this.  2. Follow a healthy diet and try to achieve a healthy weight  Some other self-help measures are:  ??? Keep your skin cool and dry (becoming overheated and sweating can contribute to an HS flare)  ??? To reduce the pain of cysts or nodules or to help them to drain, apply hot compresses or soak in hot water for 10 minutes at a time (use a clean washcloth or a teabag soaked in hot water)  ??? For female patients, cotton underwear that does not have tight elastic in the groin can be helpful.  Boyshort, brief, or boxer style underwear may be a better option as friction on hair follicles in affected areas can be a major trigger in some patients.  These can be easily found on [place] or with some retailers.  Fruit of the Loom and Underworks are two brands that are sometimes recommended.    Finally, know that you are not alone. Coping with the pain and other symptoms of HS can be very difficult, so it may be helpful to connect with others who live with HS. Patient groups and networks can be sources of important information and support. Some internet resources for information and connections are provided below.  Resources for Information    The Hidradenitis Suppurativa Foundation: A nonprofit organized by a group of physicians interested in treating and advancing research in hidradenitis suppurativa    American Academy of Dermatology  ARanked.fi    [employer]  ElevatorPitchers.de.html  NORD: IT trainer for [employer]  https://www.rarediseases.org/rare-disease-information/rare-diseases/byID/358/viewAbstract  Trials of new medications for HS  Https://www.clinicaltrials.gov

## 2021-05-12 NOTE — Unmapped (Signed)
I saw and evaluated the patient, participating in the key elements of the service.  I discussed the findings, assessment and plan with the resident and agree with resident’s findings and plan as documented in the resident's note.  I was immediately available for the entirety of the procedure(s) and present for the key and critical portions. Marshella Tello J Tyrrell Stephens, MD

## 2021-05-14 MED ORDER — MONTELUKAST 10 MG TABLET
ORAL_TABLET | Freq: Every day | ORAL | 11 refills | 30 days | Status: CP
Start: 2021-05-14 — End: 2022-05-14

## 2021-05-14 NOTE — Unmapped (Signed)
[facility] Refill Coordination Note    Specialty Medication(s) to be Shipped:   Inflammatory Disorders: Humira    Other medication(s) to be shipped: No additional medications requested for fill at this time     Teresa Bowman, DOB: [birth year]  Phone: [phone] (home)       All above HIPAA information was verified with patient.     Was a Nurse, learning disability used for this call? No    Completed refill call assessment today to schedule patient's medication shipment from the [facility] [phone]).  All relevant notes have been reviewed.     Specialty medication(s) and dose(s) confirmed: Regimen is correct and unchanged.   Changes to medications: Anderia reports no changes at this time.  Changes to insurance: No  New side effects reported not previously addressed with a pharmacist or physician: None reported  Questions for the pharmacist: No    Confirmed patient received a Conservation officer, historic buildings and a Surveyor, mining with first shipment. The patient will receive a drug information handout for each medication shipped and additional FDA Medication Guides as required.       DISEASE/MEDICATION-SPECIFIC INFORMATION        For patients on injectable medications: Patient currently has 1 doses left.  Next injection is scheduled for 05/18/2021.    SPECIALTY MEDICATION ADHERENCE     Medication Adherence    Patient reported X missed doses in the last month: 0  Specialty Medication: Humira CF 40 mg/0.4 ml  Patient is on additional specialty medications: No  Any gaps in refill history greater than 2 weeks in the last 3 months: no  Demonstrates understanding of importance of adherence: yes  Informant: patient  Reliability of informant: reliable  Confirmed plan for next specialty medication refill: delivery by pharmacy  Refills needed for supportive medications: not needed              Were doses missed due to medication being on hold? No    Humira 40mg / 0.91ml: 7 days of medicine on hand REFERRAL TO PHARMACIST     Referral to the pharmacist: Not needed      [facility]     Shipping address confirmed in Epic.     Delivery Scheduled: Yes, Expected medication delivery date: 05/18/2021.     Medication will be delivered via UPS to the prescription address in Epic WAM.    Melissaann Dizdarevic D Mitchell Epling   [facility] Specialty Technician

## 2021-05-17 MED FILL — HUMIRA PEN CITRATE FREE 40 MG/0.4 ML: SUBCUTANEOUS | 28 days supply | Qty: 4 | Fill #4

## 2021-05-26 ENCOUNTER — Telehealth
Admit: 2021-05-26 | Discharge: 2021-05-27 | Payer: PRIVATE HEALTH INSURANCE | Attending: Student in an Organized Health Care Education/Training Program | Primary: Student in an Organized Health Care Education/Training Program

## 2021-05-26 DIAGNOSIS — R7303 Prediabetes: Principal | ICD-10-CM

## 2021-05-26 DIAGNOSIS — G4700 Insomnia, unspecified: Principal | ICD-10-CM

## 2021-05-26 MED ORDER — METFORMIN ER 500 MG TABLET,EXTENDED RELEASE 24 HR
ORAL_TABLET | Freq: Every day | ORAL | 3 refills | 30 days
Start: 2021-05-26 — End: 2022-05-26

## 2021-05-26 MED ORDER — WEGOVY 0.25 MG/0.5 ML SUBCUTANEOUS PEN INJECTOR
SUBCUTANEOUS | 0 refills | 0.00000 days | Status: CP
Start: 2021-05-26 — End: ?

## 2021-05-26 MED ORDER — TRAZODONE 50 MG TABLET
ORAL_TABLET | Freq: Every evening | ORAL | 0 refills | 30 days | Status: CP
Start: 2021-05-26 — End: 2021-06-25

## 2021-05-26 NOTE — Unmapped (Signed)
Internal Medicine Video Visit    This visit is conducted via video conferencing.    Contact Information  Person Contacted: Patient  Contact Phone number: [phone] (home)   Is there someone else in the room? No.   Patient agreed to a video visit    Teresa Bowman is a 40 y.o. female  participating in a video visit.    Reason for visit:  Follow-up    Subjective:  Was on Ozempic for several weeks, but started getting GI symptoms so had to stop. On Metformin 1500mg  a day. All of this was prescribed by [facility]. She will continue to follow-up with them.     Issues with sleep. 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 Feels energetic at night and tired during the day. She has tried melatonin.     Wondering about another booster shot (she has had 3 boosters).       I have reviewed the problem list, medications, and allergies and have updated/reconciled them if needed.    Objective:  Well apearing, alert and oriented           Assessment & Plan:  1. Morbid obesity (CMS-HCC)   On metformin 1500 mg daily, has GI side effects with 2000 mg. Did not tolerate Ozempic due to GI side effects. Will try to get her approved for [facility]. Can also try Victoza. She tolerated Trulicity well but had injection site reactions.   - prescribed wegovy 0.25 mg  - continue metformin 1500 mg daily  - continue following with [facility]   2. Insomnia, unspecified type   - trazodone 50 mg nightly prn  - referral to neurology sleep clinic     3. Pre-diabetes   - due for repeat A1c at next in person visit  - metformin, ?wegovy as above              Return in about 3 months (around 08/26/2021).       Staffed with Dr. Clearence Cheek, discussed        The patient reports they are currently: at home. I spent 15 minutes on the real-time audio and video with the patient on the date of service. I spent an additional 10 minutes on pre- and post-visit activities on the date of service.     The patient was physically located in [place] or a state in which I am permitted to provide care. The patient and/or parent/guardian understood that s/he may incur co-pays and cost sharing, and agreed to the telemedicine visit. The visit was reasonable and appropriate under the circumstances given the patient's presentation at the time.    The patient and/or parent/guardian has been advised of the potential risks and limitations of this mode of treatment (including, but not limited to, the absence of in-person examination) and has agreed to be treated using telemedicine. The patient's/patient's family's questions regarding telemedicine have been answered.     If the visit was completed in an ambulatory setting, the patient and/or parent/guardian has also been advised to contact their provider???s office for worsening conditions, and seek emergency medical treatment and/or call 911 if the patient deems either necessary.

## 2021-05-26 NOTE — Unmapped (Cosign Needed)
[facility]     Type of visit:  face to face    Reason for visit: f/u  Questions / Concerns that need to be addressed: medication,    General Consent to Treat (GCT) for non-epic video visits only: Verbal consent    Diabetes:  ??? Regularly checking blood sugars?: no  o If yes, when? Complete log for past 7 days  Date Before Breakfast After Breakfast Before Lunch After Lunch Before Dinner After Dinner Before Bed                                                                                                                                   Hypertension:  ??? Have blood pressure cuff at home?: no  ??? Regularly checking blood pressure?: no  If yes, complete log for past 7 days  Date Time BP Pulse                                                                             Allergies reviewed: Yes    Medication reviewed: Yes(pharmacy only)  Pended refills? Yes    HCDM reviewed and updated in Epic:    We are working to make sure all of our patients??? wishes are updated in Epic and part of that is documenting a Environmental health practitioner for each patient  A Health Care Decision Maker is someone you choose who can make health care decisions for you if you are not able - who would you most want to do this for you????  was updated.            COVID-19 Vaccine Summary  Which COVID-19 Vaccine was administered  Pfizer  Type:  Dates Given:  08/19/2020                     Immunization History   Administered Date(s) Administered   ??? COVID-19 VACC,MRNA,(PFIZER)(PF)(IM) 01/29/2020, 02/19/2020, 08/19/2020   ??? Covid-19 Vacc, Unspecified 01/29/2020, 02/19/2020   ??? Influenza Vaccine Quad (IIV4 PF) 65mo+ injectable 08/15/2018   ??? Influenza Virus Vaccine, unspecified formulation 08/21/2017, 08/08/2019   ??? PNEUMOCOCCAL POLYSACCHARIDE 23 11/19/2017

## 2021-05-28 NOTE — Unmapped (Signed)
Prior authorization sent to plan on 05/28/2021 with/ from [facility] via [employer].    Medication: [id number]    Reference ID: Key:??[id number]       Status: waiting for plan determination; determination expected within 72 hours via CoverMyMeds website.    Pharmacy Notified: not at this time.    Patient Notified: not at this time.    Provider Notified: not at this time.    Clinical Information: Milinda Pointer, CMA.

## 2021-05-28 NOTE — Unmapped (Signed)
This was a telehealth service performed by a resident and I was available to the resident via real-time audio and video connection. Immediately after or during the visit, I reviewed with the resident the medical history and the resident???s assessment and plan. I discussed with the resident the patient???s diagnosis and concur with the treatment plan as documented in the resident note.     Johnney Ou, MD, MBA

## 2021-05-28 NOTE — Unmapped (Signed)
Denied. <Weight Loss Drugs> are an excluded product/service.

## 2021-06-01 ENCOUNTER — Ambulatory Visit
Admit: 2021-06-01 | Discharge: 2021-06-02 | Payer: PRIVATE HEALTH INSURANCE | Attending: Registered Nurse | Primary: Registered Nurse

## 2021-06-01 DIAGNOSIS — G4721 Circadian rhythm sleep disorder, delayed sleep phase type: Principal | ICD-10-CM

## 2021-06-01 DIAGNOSIS — G4730 Sleep apnea, unspecified: Principal | ICD-10-CM

## 2021-06-01 NOTE — Unmapped (Addendum)
Sleep study ordered: [place] [facility] phone number: [phone]     Delayed sleep phase disorder:   Current bedtime 4am, wake time 2pm   - goal bedtime 1030pm,  Wake time  830am     - Light therapy: Get 30-60 minutes of bright light within first 1-2 hours of wake time (~2pm)    -white light: 10,000 lux   -blue light: [employer] GoLite    -green light: Re-timer glasses   -Melatonin: take 0.5mg  melatonin 5-7 hours before bedtime (~11pm)     - move entire schedule 30 minutes earlier each day IF time it takes to fall asleep is within 1 hour of bedtime. If you do NOT fall asleep within 1 hour of bedtime, hold the same schedule and reassess readiness the next night.    - continue until you reach your goal bedtime. As you get closer to goal, we will move melatonin within 2 hours of bedtime.   - Avoid bright light between time of melatonin dose to time of morning light therapy. Use blue blocking glasses or limit screen time   - Keep consistent wake time!

## 2021-06-01 NOTE — Unmapped (Signed)
Requesting MD:Dr. Myrna Blazer   PCP: Kathrin Ruddy, MD     I am seeing Teresa Bowman in consultation at the request of Dr. Myrna Blazer  for evaluation and opinion on treatment of insomnia.     HPI: Teresa Bowman is a 40 y.o. female with history of allergic rhinitis, obesity, asthma, anxiety, chronic pain syndrome, fibromyalgia here for evaluation of insomnia.     She reports daytime somnolence. Daytime sleepiness started as a teenager. She had attendance problems while in school for sleeping in class and missing school to sleep in. Her children have had attendance issues at school as well, due to patient not being able to wake up to get them ready on time. Her son is 62 years old and has been doing online school for past year due to covid and her sleep issues. She wants to try to send him back to school in August. She also has been apprehensive due to covid concerns w/ his asthma and obesity. She needs to improve her sleep schedule to get him to school on time. Her kids wake up around 1130am and go to sleep around 12-1am.     She states it is hard to hold a job due to her sleep schedule.     Prior to kids she slept until 230pm every day. Once she had kids she was forced to try to sleep on more normal sleep schedule (BT 1-2am) and has struggled since. For past two years during pandemic at home she delayed further to 4am. Her fatigue and sleepiness has worsened in past 2 years.      Pt reports history of light snoring. Denies observed apneas or gasping arousals. [facility] [place], [facility] in [place], and [facility] in [place]. She states she was diagnosed with insomnia and delayed sleep phase. She had a sleep study done.   Her most recent study was done 4-5 years ago at [facility]. She recalls it was negative for sleep apnea. She does not have any records. Since that study, she had sinus surgery in 2018 to help with breathing and sinus infections. Sinus issues are resolved. She reports recent weight gain.  Previous sleep center is closed and she was unable to collect records. She reports her study was done overnight and ended before 7am-- missing her typical sleep window.     She tried a light box from [place]. She used at 11:30am after setting an alarm. She sat in front of light or used while in front of computer. The first week she felt sleepiness wore off quicker and was able to be more active. After the week, she was falling asleep in front of light.  She stopped using light therapy.     She reports caffeine and stimulants have opposite effect and calm her down.     She is not working. 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 In summer she does not work due to not tolerating heat. She gained 15 lbs and was having issues getting in and out of car for this job.     She sees [facility]. She does not feel hungry until 5pm. She eats first meal around 4-5pm, light breakfast. She eats lunch around 6-7pm. Dinner around 1130p-12am. She grazes on snacks. She eats frozen meals, toast, waffles. She snacks after dinner until bedtime.      Takes spironolactone  in evening. Takes cymbalta in evening- felt groggy when taking it in morning. Takes singular around 4pm.     SLEEP HISTORY:  Bedtime behavior: phone , uses blue light filter   In bed: 2-3am   Lights out: turns lamp off when she gets sleepy, ~345am   Sleep latency: 4am   Arousals: mostly does not wake up   [facility] time: 1-2pm w/o alarm. She sets multiple alarms at 11a-12pm   Feels refreshed: if she sleeps until 1-2pm yes, if wakes up earlier unrefreshed    Out of bed: if she has to go somewhere, otherwise lays in bed for 1 hour   Morning headaches: no   Morning dry mouth: no    Weekend/vacation sleep/wake times: same   Naps: no naps    EDS/Driving: no sleepiness   Circadian: night BT 4am, WT 2pm , TST 10hrs. Pain feels better w/ longer sleep times.   Hypnotic meds: melatonin 4.5mg , she was told to take 1-3mg . Takes at 1030-1130pm.  Her dr rx'd trazodone but has not picked it up yet.   Stimulant meds: none   Caffeine: Drinks coffee 1-2 times per week. Drinks diet soda 1-2 cans of soda per day mid afternoon and evening   Alcohol: none   Tobacco: none    Illicits: none   Nasal symptoms: none   Exercise: none     Epworth Sleepiness Scale Score:   0= no chance of dozing  1= slight chance of dozing  2= moderate chance of dozing  3= high chance of dozing  Situation:   3 Sitting and reading  2 Watching TV   0 Sitting inactive in a public place  1 As a passenger in a car driving for over an hour  3 Lying down to rest in afternoon if circumstances permit  0 Sitting and talking to someone  1 Sitting quietly after a lunch without alcohol  0 Driving and the car stops for a few minutes  Total score: 10    Restless legs symptoms: No.  Sleep paralysis: No.  Hypnagogic/Hypnapompic hallucinations: No.  Nightmares: No.  Dream enacting behaviors: No.   Sleepwalking: No. as teenager   Sleeptalking: No.  Enuresis: No.  Nocturia: No. occasionally   Snoring: Yes.  Observed apneas: No.  Nightsweats: Yes.  Waking up gasping for air: No.  Waking up with heartburn: No.  Bruxism: No.    Allergies:   Naltrexone, Augmentin [amoxicillin-pot clavulanate], Ceclor [cefaclor], and Penicillins    Past Medical History:   Diagnosis Date   ??? Abnormal Pap smear of cervix     ?2002   ??? Acute venous embolism and thrombosis of other specified veins(453.89)     ovarian vein thrombosis   ??? Allergic rhinitis    ??? Anxiety    ??? Anxiety    ??? Asthma     mild   ??? Bilateral low back pain with left-sided sciatica 10/06/2014   ??? Chronic pain syndrome     due to lumbar disk disease   ??? Clotting disorder (CMS-HCC)    ??? Decreased libido    ??? Disorder of skin or subcutaneous tissue    ??? Fibromyalgia    ??? Insomnia    ??? Inverted nipple     left nipple inverts x several years   ??? Morbid obesity (CMS-HCC)    ??? Numbness     left side   ??? Obesity    ??? Pain in thoracic spine at multiple sites 10/06/2014   ???  Spontaneous vaginal delivery    ??? Urinary frequency    ??? Urinary incontinence    ??? Venous insufficiency    ??? Weakness     left side     Past surgical history:   Past Surgical History:   Procedure Laterality Date   ??? CHOLECYSTECTOMY     ??? CRYOTHERAPY      cervix   ??? PR EXCISION TURBINATE,SUBMUCOUS Bilateral 09/16/2017    Procedure: BILATERAL MAXILLARY ANTROSTOMIES;  Surgeon: Meryle Ready, MD;  Location: OR [place];  Service: ENT   ??? TONSILLECTOMY       Current Outpatient Medications   Medication Sig Dispense Refill   ??? clindamycin (CLEOCIN T) 1 % external solution Apply topically daily. 60 mL 11   ??? clobetasoL (TEMOVATE) 0.05 % ointment Twice daily as needed for spot treatment for up to one week on each spot 60 g 5   ??? doxycycline (VIBRAMYCIN) 100 MG capsule Twice daily for 2 weeks for flare 30 capsule 4   ??? empty container Misc Use as directed to dispose of Humira Pens 1 each 3   ??? HUMIRA PEN CITRATE FREE 40 MG/0.4 ML Inject the contents of 1 pen (40 mg total) under the skin every seven (7) days. 4 each 5   ??? HUMIRA PEN CITRATE FREE STARTER PACK FOR CROHN'S/UC/HS 3 X 80 MG/0.8 ML Inject the contents of 2 pens (160 mg) on day 1, THEN 1 pen (80 mg) on day 15. 3 each 0   ??? hydrOXYchloroQUINE (PLAQUENIL) 200 mg tablet Plaquenil 200 mg tablet   Take 1 po bid wiht food     ??? melatonin 5 mg cap Take by mouth.     ??? metFORMIN (GLUCOPHAGE-XR) 500 MG 24 hr tablet Take 3 tablets (1,500 mg total) by mouth daily before breakfast. Take 500 mg once a day, increase by 500 mg every 3 weeks until 2000 mg daily 90 tablet 3   ??? montelukast (SINGULAIR) 10 mg tablet Take 1 tablet (10 mg total) by mouth in the morning. 30 tablet 11   ??? multivitamin (THERAGRAN) per tablet Take 1 tablet by mouth daily.     ??? spironolactone (ALDACTONE) 50 MG tablet TAKE 1 TABLET (50 MG TOTAL) BY MOUTH DAILY WITH FOOD. 90 tablet 1 ??? triamcinolone (KENALOG) 0.1 % cream Twice daily for up to 2 weeks for healing areas 80 g 2   ??? DULoxetine (CYMBALTA) 60 MG capsule Take 1 capsule (60 mg total) by mouth daily. 30 capsule 5   ??? traZODone (DESYREL) 50 MG tablet Take 1 tablet (50 mg total) by mouth nightly. (Patient not taking: Reported on 06/01/2021) 30 tablet 0   ??? WEGOVY 0.25 MG/0.5 ML SUBCUTANEOUS PEN INJECTOR Inject 0.25 mg under the skin every seven (7) days. Contact me after 4 injections for dose increase if well tolerated (Patient not taking: Reported on 06/01/2021) 2 mL 0     No current facility-administered medications for this visit.     Social History:   Lives w/ 2 kids. Not working. Ex-husband comes over to help w/ kids.     Family History   Problem Relation Age of Onset   ??? Diabetes Father    ??? Arthritis Father    ??? Breast cancer Maternal Grandmother    ??? Cancer Maternal Grandmother    ??? Breast cancer Paternal Grandmother    ??? Cancer Paternal Grandmother    ??? Multiple sclerosis Mother    ??? Hip fracture Mother    ??? Arthritis  Brother         Reiter's   ??? Squamous cell carcinoma Neg Hx    ??? Basal cell carcinoma Neg Hx    ??? Melanoma Neg Hx    Family history of sleep disorders: brother and sister w/ sleep walking as teens.     ROS:   All systems reviewed. ROS as per HPI and history noted above, otherwise negative.   Constitutional- no fevers, chills, + wt gain   Eyes- uses glasses, no other problems  Ears, nose, mouth, throat- no sinus congestion, no problems with hearing  Cardiovascular- no chest pain or palpitations  Respiratory- no cough, SOB, DOE  Gastrointestinal- no nausea, vomiting, diarrhea  Genitourinary- no dysuria or hematuria  Musculoskeletal- +chronic pain   Neuro- no strokes or seizures  Psych- +anxiety, depression. No SI/HI     Physical Exam:   BP 124/81 (BP Site: R Arm, BP Position: Sitting, BP Cuff Size: Medium)  - Pulse 93  - Resp 24  - Ht 167.6 cm (5' 6)  - Wt (!) 164.7 kg (363 lb)  - BMI 58.59 kg/m??   BMI: Body mass index is 58.59 kg/m??.  General: The patient is well groomed and is in no acute distress; the patient is pleasant and cooperative +obese  Oropharynx: mallampatti class 4, absent tonsils, no retrognathia, no micrognathia, +macroglossia, slight scalloped tongue, 4FB mentum hyoid space  Nasal: patent, R enlarged turbinate, left normal , no NSD  Neck:  Neck circumference: 16.5in   NEURO   Mental status: The patient is alert, oriented and attentive.  No aphasia, neglect, Speech is clear.  Gait/Station: normal stroll, stressed and tandem gaits; Romberg's sign is not present     Labs/Studies:   Lab Results   Component Value Date    WBC 16.8 (H) 03/18/2020    HGB 13.1 03/18/2020    HCT 39.6 03/18/2020    PLT 296 03/18/2020     Lab Results   Component Value Date    NA 136 01/29/2021    K 4.1 01/29/2021    CL 101 02/05/2020    CO2 30.0 02/05/2020    BUN 14 02/05/2020    CREATININE 0.59 (L) 01/29/2021    GLU 112 02/05/2020    CALCIUM 9.7 02/05/2020     Lab Results   Component Value Date    BILITOT 0.6 02/05/2020    PROT 7.6 02/05/2020    ALBUMIN 4.1 02/05/2020    ALT 12 02/05/2020    AST 19 02/05/2020    ALKPHOS 114 02/05/2020     Lab Results   Component Value Date    INR 2.3 12/27/2010     Ferritin (ng/mL)   Date Value   12/27/2018 26.3     Assessment and Plan:   In summary Teresa Bowman is a 40 y.o. female with history of allergic rhinitis, obesity, asthma, anxiety, chronic pain syndrome, fibromyalgia here for consultation for insomnia. 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 Additionally patient reports history of snoring, night sweats, excessive daytime somnolence, and weight gain suggestive of sleep disordered breathing. Recommend sleep study to rule out OSA and to be done on delayed sleep phase schedule to include expected REM hours. I suspect her last study that she reports ended at 7am missed REM sleep opportunity and therefore would be underestimation and possibly missed a diagnosis of sleep apnea.   DSWPD:   - her goal BT 1030pm WT 830am , current sleep schedule BT 4am WT 2pm   -  Light therapy: Get 30-60 minutes of bright light within first 1-2 hours of wake time (~2pm)    -white light: 10,000 lux   -blue light: [employer] GoLite    -green light: Re-timer glasses   -Melatonin: take 0.5mg  melatonin 5-7 hours before bedtime (~11pm)   - move entire schedule 30 minutes earlier each day IF time it takes to fall asleep is within 1 hour of bedtime. If you do NOT fall asleep within 1 hour of bedtime, hold the same schedule and reassess readiness the next night.    - continue until you reach your goal bedtime. As you get closer to goal, we will move melatonin within 2 hours of bedtime.   - Avoid bright light between time of melatonin dose to time of morning light therapy. Use blue blocking glasses or limit screen time   - Keep consistent wake time!     Sleep apnea:   - sleep study ordered on delayed schedule   - discussed sleep apnea diagnosis and treatment options including PAP therapy, oral appliance, ENT surgeries, Inspire, weight loss   - discussed the risk of untreated OSA including cardiopulmonary morbidity and mortality   - counseled do not drive if sleepy, no alcohol within 3 hours of bedtime, and encouraged healthy weight    Patient verbalized understanding of these issues, agrees with the plan and all questions were answered today. Patient was given an opportunity to voice any other symptoms or concerns not listed above. Patient did not have any other symptoms or concerns.   Patient told to return in 1-2 months.     Glori Bickers FNP-BC

## 2021-06-08 NOTE — Unmapped (Signed)
[facility] Refill Coordination Note    Specialty Medication(s) to be Shipped:   Inflammatory Disorders: Humira CF 40mg /0.58ml Inj  Other medication(s) to be shipped: No additional medications requested for fill at this time     Teresa Bowman, DOB: 30-Jun-1981  Phone: [phone] (home)     All above HIPAA information was verified with patient.     Was a Nurse, learning disability used for this call? No    Completed refill call assessment today to schedule patient's medication shipment from the [facility] [phone]).  All relevant notes have been reviewed.     Specialty medication(s) and dose(s) confirmed: Regimen is correct and unchanged.   Changes to medications: Sherisse reports no changes at this time.  Changes to insurance: No  New side effects reported not previously addressed with a pharmacist or physician: None reported  Questions for the pharmacist: No    Confirmed patient received a Conservation officer, historic buildings and a Surveyor, mining with first shipment. The patient will receive a drug information handout for each medication shipped and additional FDA Medication Guides as required.       DISEASE/MEDICATION-SPECIFIC INFORMATION        For patients on injectable medications: Patient currently has 2 doses left.  Next injection is scheduled for 06/08/2021 & 06/15/2021.    SPECIALTY MEDICATION ADHERENCE     Medication Adherence    Patient reported X missed doses in the last month: 0  Specialty Medication: Humira CF 40mg /0.51ml Inj  Patient is on additional specialty medications: No  Patient is on more than two specialty medications: No  Informant: patient  Reliability of informant: reliable  Reasons for non-adherence: no problems identified        Were doses missed due to medication being on hold? No    Humira CF 40mg /0.26ml Inj: 14 days of medicine on hand     REFERRAL TO PHARMACIST     Referral to the pharmacist: Not needed    [facility]     Shipping address confirmed in Epic. Delivery Scheduled: Yes, Expected medication delivery date: 06/15/2021.     Medication will be delivered via UPS to the prescription address in Epic WAM.    Erian Rosengren P Wetzel [place] [facility] Specialty Technician

## 2021-06-14 MED FILL — HUMIRA PEN CITRATE FREE 40 MG/0.4 ML: SUBCUTANEOUS | 28 days supply | Qty: 4 | Fill #5

## 2021-06-15 DIAGNOSIS — L732 Hidradenitis suppurativa: Principal | ICD-10-CM

## 2021-06-15 MED ORDER — SPIRONOLACTONE 50 MG TABLET
ORAL_TABLET | 1 refills | 0.00000 days | Status: CN
Start: 2021-06-15 — End: ?

## 2021-06-15 NOTE — Unmapped (Signed)
Prescription refill request for Spironolatone, Last office visit was 05/11/21    Please Advise

## 2021-06-16 MED ORDER — SPIRONOLACTONE 50 MG TABLET
ORAL_TABLET | 1 refills | 0.00000 days | Status: CP
Start: 2021-06-16 — End: ?

## 2021-06-23 ENCOUNTER — Institutional Professional Consult (permissible substitution)
Admit: 2021-06-23 | Discharge: 2021-06-24 | Payer: PRIVATE HEALTH INSURANCE | Attending: Student in an Organized Health Care Education/Training Program | Primary: Student in an Organized Health Care Education/Training Program

## 2021-06-23 DIAGNOSIS — Z6841 Body Mass Index (BMI) 40.0 and over, adult: Principal | ICD-10-CM

## 2021-06-23 DIAGNOSIS — R41840 Attention and concentration deficit: Principal | ICD-10-CM

## 2021-06-23 DIAGNOSIS — F32A Depression, unspecified depression type: Principal | ICD-10-CM

## 2021-06-23 MED ORDER — CITALOPRAM 20 MG TABLET
ORAL_TABLET | Freq: Every day | ORAL | 2 refills | 30 days | Status: CP
Start: 2021-06-23 — End: 2022-06-23

## 2021-06-23 MED ORDER — METFORMIN ER 500 MG TABLET,EXTENDED RELEASE 24 HR
ORAL_TABLET | Freq: Every day | ORAL | 3 refills | 45 days
Start: 2021-06-23 — End: 2022-06-23

## 2021-06-23 MED ORDER — LIRAGLUTIDE 0.6 MG/0.1 ML (18 MG/3 ML) SUBCUTANEOUS PEN INJECTOR
Freq: Every day | SUBCUTANEOUS | 11 refills | 30 days | Status: CP
Start: 2021-06-23 — End: 2022-06-23

## 2021-06-23 NOTE — Unmapped (Signed)
Internal Medicine Phone Visit    This visit is conducted via telephone.    Contact Information  Person Contacted: Patient  Contact Phone number: [phone] (home)   Is there someone else in the room? No.   Patient agreed to a phone visit    [name] is a 40 y.o. female  participating in a telephone encounter.    Reason for Call  Depression    Subjective:  Teresa Bowman is calling because she's been feeling a little bit depressed. She has been able to work as much because of the whether. This has been going on for a few weeks.   She has been having low motivation, stays in bed a lot, and gained some weight as well. She has been eating more. She has been sleeping more as well. Went through [facility].     She has never been on Wellbutrin but her son has and he had side effects (serotonin syndrome, but he was also on sertraline). She read online about Paxil.    She is also wondering about a weight loss medication, we talked about Victoza last time and she is wondering about starting that.     She also thinks she might have ADHD. She feels more calm after drinking caffeine and she has issues with focus and inattention.     I have reviewed the problem list, medications, and allergies and have updated/reconciled them if needed.    Objective:  Alert, pleasant, oriented.          PHQ-9 Score:  PHQ-9 TOTAL SCORE: 14  GAD-7 Score:         Assessment & Plan:  1. Depression, unspecified depression type   Discussed options including Wellbutrin vs Sertraline vs Citalopram. Discussed side effects of each. Although I think Wellbutrin would be good especially because it could help with weight loss she would rather start with citalopram.   - citalopram 10 mg for 1-2 weeks, then increase to 20 mg  - follow-up in 6 weeks     2. Attention and concentration deficit   - [facility] for formal ADHD evaluation and recommendations     3. Class 3 severe obesity with serious comorbidity and body mass index (BMI) of 50.0 to 59.9 in adult, unspecified obesity type (CMS-HCC)   Couldn't tolerate Ozempic due to GI side effects. Trulicity caused injection site reactions. Hesitant to start [facility] because of reaction to Ozempic. We discussed Victoza, she is amenable even though its a daily injection. She is currently taking 1000 mg metformin daily (couldn't tolerate 1500 because of GI side effects).   - start Victoza 0.6 mg daily, increase by 0.6 mg every week until 3 mg daily              Return in about 6 weeks (around 08/04/2021).     Staffed with Dr.Birschback, discussed        The patient reports they are currently: at home. I spent 25 minutes on the phone with the patient on the date of service. I spent an additional 10 minutes on pre- and post-visit activities on the date of service.     The patient was physically located in [place] or a state in which I am permitted to provide care. The patient and/or parent/guardian understood that s/he may incur co-pays and cost sharing, and agreed to the telemedicine visit. The visit was reasonable and appropriate under the circumstances given the patient's presentation at the time.    The patient and/or parent/guardian has been  advised of the potential risks and limitations of this mode of treatment (including, but not limited to, the absence of in-person examination) and has agreed to be treated using telemedicine. The patient's/patient's family's questions regarding telemedicine have been answered.     If the visit was completed in an ambulatory setting, the patient and/or parent/guardian has also been advised to contact their provider???s office for worsening conditions, and seek emergency medical treatment and/or call 911 if the patient deems either necessary.

## 2021-06-23 NOTE — Unmapped (Signed)
[facility]       Type of visit:  telephone    Reason for visit: talk about new medication    Questions / Concerns that need to be addressed: no     General Consent to Treat (GCT) for non-epic video visits only: Verbal consent      Diabetes:  ??? Regularly checking blood sugars?: no      Hypertension:  ??? Have blood pressure cuff at home?: no  ??? Regularly checking blood pressure?: no  ??? If patient has a  record of recent blood pressures, please enter into flow sheets          Allergies reviewed: Yes    Medication reviewed: Yes  Pended refills? No        HCDM reviewed and updated in Epic:    We are working to make sure all of our patients??? wishes are updated in Epic and part of that is documenting a Environmental health practitioner for each patient  A Health Care Decision Maker is someone you choose who can make health care decisions for you if you are not able - who would you most want to do this for you????  is already up to date.    COVID-19 Vaccine Summary  Which COVID-19 Vaccine was administered  Pfizer  Type:  Dates Given:  08/19/2020      Immunization History   Administered Date(s) Administered   ??? COVID-19 VACC,MRNA,(PFIZER)(PF)(IM) 01/29/2020, 02/19/2020, 08/19/2020   ??? Covid-19 Vacc, Unspecified 01/29/2020, 02/19/2020   ??? Influenza Vaccine Quad (IIV4 PF) 27mo+ injectable 08/15/2018   ??? Influenza Virus Vaccine, unspecified formulation 08/21/2017, 08/08/2019   ??? PNEUMOCOCCAL POLYSACCHARIDE 23 11/19/2017       __________________________________________________________________________________________    SCREENINGS COMPLETED IN FLOWSHEETS    HARK Screening       AUDIT       PHQ2       PHQ9          P4 Suicidality Screener                GAD7       COPD Assessment       Falls Risk       .imcres

## 2021-06-23 NOTE — Unmapped (Signed)
Pharmacy Request for Medication Clarification    ??? Name of medication: liraglutide (VICTOZA) injection pen  ??? What needs to be clarified?:     Pharmacy states they need a new Rx with it being for 6 mL and it come with two 3 mL pens. That is the smallest package size.     There will also need to be sent a Pen Needle Rx so she is able to use this.     ??? Return call to:   o Name: Mr. Silvio Clayman Company:   o Phone number: [phone]  ??? PCP: Kathrin Ruddy, MD  ??? Last encounter in department: 01/29/2021

## 2021-06-24 NOTE — Unmapped (Signed)
Pharmacy  Requests New Rx   Pharmacy voiced they have not received Rx's please resend.  ??? Name of medication:     liraglutide (VICTOZA) injection pen  Dispense Quantity: 6 mL  (not 3 mL)  & Pen  needles        ??? Is there a preferred amount requested (e.g. 30 pills, 3 months worth - if no leave blank):  ??? Desired pharmacy (defaults to patient's preferred pharmacy list - confirm or change):      [facility] - [place], [place] - [address]  [address]  [place] [address]  Phone: [phone] Fax: [phone]      ??? Best callback number if any questions (defaults to patient's preferred phone - confirm or change): [phone]  ??? PCP: Kathrin Ruddy, MD  ??? Last encounter in department: 01/29/2021 (If more than a year, offer an appointment.)

## 2021-06-25 MED ORDER — LIRAGLUTIDE 0.6 MG/0.1 ML (18 MG/3 ML) SUBCUTANEOUS PEN INJECTOR
Freq: Every day | SUBCUTANEOUS | 11 refills | 60 days | Status: CP
Start: 2021-06-25 — End: 2022-06-25

## 2021-06-25 MED ORDER — PEN NEEDLE, DIABETIC 31 GAUGE X 5/16" (8 MM)
11 refills | 0 days | Status: CP
Start: 2021-06-25 — End: 2022-06-25

## 2021-06-25 NOTE — Unmapped (Signed)
Duplicate message. 

## 2021-06-25 NOTE — Unmapped (Signed)
Call placed to pharmacy. Script for Victoza needs to be written for a minimum of 6 mL and patient will need pen needles as well. Please send both scripts to pharmacy.

## 2021-06-28 MED ORDER — LIRAGLUTIDE 0.6 MG/0.1 ML (18 MG/3 ML) SUBCUTANEOUS PEN INJECTOR
Freq: Every day | SUBCUTANEOUS | 11 refills | 60 days | Status: CP
Start: 2021-06-28 — End: 2022-06-28

## 2021-06-28 NOTE — Unmapped (Signed)
Insulin sent to wrong pharmacy,  Resent to [facility] pharmacy.

## 2021-06-28 NOTE — Unmapped (Signed)
Medication Question/ Issue from Patient    Name of medication:  liraglutide (VICTOZA) injection pen     pen needle, diabetic (PEN NEEDLE) 31 gauge x 5/16 (8 mm) Ndle [[id number]]     ???   ??? Caller described issue: send to wrong pharmacy-pt needs medication as soon as possible    ??? Caller desired outcome (eg, re-send to pharmacy): [facility] - [place], [place] - [address]   [address], [place] [address]   Phone:  [phone] ??Fax:  [phone]  ???   ??? Best callback number if any questions (defaults to patient's preferred phone - confirm or change): [phone]  ??? PCP: Kathrin Ruddy, MD  ??? Last encounter in department: 01/29/2021  (If more than a year, offer an appointment.)

## 2021-06-28 NOTE — Unmapped (Signed)
Rx sent to [facility]

## 2021-06-28 NOTE — Unmapped (Signed)
Patient on wait list    Thanks!

## 2021-06-28 NOTE — Unmapped (Signed)
No ma'am 

## 2021-07-05 ENCOUNTER — Ambulatory Visit: Admit: 2021-07-05 | Discharge: 2021-07-06 | Payer: PRIVATE HEALTH INSURANCE

## 2021-07-05 DIAGNOSIS — L732 Hidradenitis suppurativa: Principal | ICD-10-CM

## 2021-07-05 DIAGNOSIS — Z79899 Other long term (current) drug therapy: Principal | ICD-10-CM

## 2021-07-05 DIAGNOSIS — D492 Neoplasm of unspecified behavior of bone, soft tissue, and skin: Principal | ICD-10-CM

## 2021-07-05 DIAGNOSIS — L910 Hypertrophic scar: Principal | ICD-10-CM

## 2021-07-05 MED ORDER — HUMIRA PEN CITRATE FREE 40 MG/0.4 ML
SUBCUTANEOUS | 5 refills | 28.00000 days | Status: CP
Start: 2021-07-05 — End: 2021-08-04
  Filled 2021-07-12: qty 4, 28d supply, fill #0

## 2021-07-05 NOTE — Unmapped (Deleted)
Self-reported severity (0-5): 2  VAS pain today: 3  VAS average pain for the last month: 4  Requiring pain medication? {YES/NO:21013}  If so, what type/frequency? ***  How often in pain?  few times a week  Level of odor (0-5): 3  Level of itching (0-5): 4  Dressing changes needed for drainage:No drainage/less than once weekly  How much drainage: no drainage  Flare in the last month (Y/N)? No.  How long ago was the last flare? in last 6 months  Developing new lesions? less than monthly  Number of inflammatory lesions montly: 1-3  DLQI: 19  Current treatment: ***     How helpful is the current treatment in managing the following aspects of your disease?  Not at all helpful Somewhat helpful Very helpful   Pain   x   Decreasing length of flares   x   Decreasing new lesions   x   Drainage   x   Decreasing frequency of flares   x   Decreasing severity of flares   x   Odor   x

## 2021-07-05 NOTE — Unmapped (Addendum)
Hidradentis Suppurativa (pronounced ???high-drad-en-eye-tis/sup-your-uh-tee-vah???) is a chronic disease of hair follicles.  The lesions occur most commonly on areas of skin-to-skin contact: under the arms (axillary area), in the groin, around the buttocks, in the region around the anus and genitals, and on the skin between and under the breasts. In women, the underarms, groin, and breast areas are most commonly affected. Men most often have HS lesions around the anus and under the arms and may also have HS at the back of the neck and behind and around the ears.    What does HS look and feel like?   The first thing that someone with HS notices is a tender, raised, red bump that looks like an under-the-skin pimple or boil. Sometimes HS lesions have two or more ???heads.???  In mild disease only an occasional boil or abscess may occur, but in more active disease there can be many new lesions every month.  Some abscesses can become larger and may open and drain pus.  Bleeding and increased odor can also occur. In severe disease, deeper abscesses develop and may connect with each other under the skin to form tunnel-like tracts (sinuses, fistulas).  These may drain constantly, or may temporarily improve and then usually begin draining again over time.  In people who have had sinus tracts for some time, scars form that feel like ropes under the skin. In the very worst cases, networks of sinus tracts can form deeper in the body, including the muscle and other tissues. Many people with severe HS have scars that can limit their ability to freely move their arms or legs, though this is very unlikely for most patients.     Clinicians usually classify or ???grade??? HS using the [facility] staging system according to the severity of the disease for each body location:   [place] stage I: one or more abscesses are present, but no sinus tracts have formed and no scars have developed   Doreene Adas stage II: one or more abscesses are present that resolve and recur; on sinus tract can be present and scarring is seen   Doreene Adas stage III: many abscesses and more than one sinus tract is present with extensive scars.    What causes HS?  The cause of HS is not completely understood.  It seems to be a disorder of hair follicles and often many family members are affected so genetics probably play a strong role.  Bacteria are often present and may make the disease worse, but infection does not seem to be the main cause. Hormones are also likely play a role since the condition typically starts around puberty when hair follicles under the arms and in the groin start to change.  It can sometimes flare with menstrual cycles in women as well.  In most cases it lasts for decades and starts to improve to some extent in the late 30s and 40s as long as many fistulas have not already formed.  Women are three times more likely than men to develop HS.    Other factors are known to contribute to HS flaring or becoming worse, though they are likely not the main causes. The factors most commonly associated with HS include:   Cigarette smoking - this is very highly linked.  Stopping smoking will likely not cure the disease, but likely is helpful in reducing how much and how often it flares.   Obesity - HS may occur even in people that are not overweight, but it is much more common  in patients that are.  There is some evidence that losing weight and eating a diet low in sugars and fats may be helpful in improving hidradenitis, though this is not helpful for everyone.  Working with a nutritionist may be an important way to help with this and is something your physician can help coordinate    Hidradenitis is not contagious.  It is not caused by a problem with personal hygiene or any other activity or behavior of those with the disease.    How can your doctor help you treat your hidradenitis?  Clinicians use both medication and surgery to treat HS. The choice of treatment--or combination of treatments--is made according to an individual patient???s needs. Clinicians consider several factors in determining the most appropriate plan for therapy:   Severity of disease - medications and some laser treatments are usually able to control disease best when fistulas are not present.  Fistulas typically require surgery.   Extent and location of disease   Chronicity (how often the lesions recur)    A number of different surgical methods have been developed that are useful for certain patients under particular circumstances. These can be done with local numbing and healing at home for some areas when disease is not too extensive with relatively brief recovery times.  In more extensive disease there may be a need for larger excisions under general anesthesia with healing time in the hospital and prolonged recovery periods for better disease control.      In addition, many medical treatments have been tried--some with more success than others. No medication is effective for all patients, and you and your doctor may have to try several different agents or combinations of agents before you find the treatment plan that works best for you.  The goals of therapy with medications that are either topical (used on the skin) or systemic (taken by mouth) are:  1. to clear the lesions or at least reduce their number and extent, and  2. to prevent new lesions from forming.  3. To reduce pain, drainage, and odor  Some of the types of medications commonly used are antibacterial skin washes and the topical antibiotics to prevent secondary infections and corticosteroid injections into the lesions to reduce inflammation.     Other medications that may be used include retinoids (similar to Accutane), drugs that effect how hormones and hair follicles interact, drugs that affect your immune system (such as methotrexate, adalimumab/Humira, and Remicaid/infliximab), steroids, and oral antibiotics.    Lasers that destroy hair follicles can also be helpful since they reduce the hair follicles that cause the problems.  Multiple treatments are typically required over time and there is some discomfort associated with treatment, but it is typically very fast and well-tolerated.    It is very important to realize that hidradenitis cannot be completely cured with any single medication or surgical procedure.  It is a disease that can be very stubborn and difficult to control, but with good treatment a lot of improvement and sometimes temporary remissions can be obtained. Poorly controlled disease can cause more fistulas to form and make managing the disease much more difficult over time so it is important to seek care to reduce major flares.  Surgery can provide a long term cure in some areas, though the disease can start again or continue in nearby areas.  A dermatologist is often the best person to help coordinate disease treatment, and sometimes other surgeons, pain specialists, other specialists, and nutritionists may be  part of the treatment team.    What can you do to help your HS?  1. If your are a smoker, then stopping can probably be helpful.  Your dermatologist will be happy to refer you to some one who can help with this.  2. Follow a healthy diet and try to achieve a healthy weight  Some other self-help measures are:   Keep your skin cool and dry (becoming overheated and sweating can contribute to an HS flare)   To reduce the pain of cysts or nodules or to help them to drain, apply hot compresses or soak in hot water for 10 minutes at a time (use a clean washcloth or a teabag soaked in hot water)   For female patients, cotton underwear that does not have tight elastic in the groin can be helpful.  Boyshort, brief, or boxer style underwear may be a better option as friction on hair follicles in affected areas can be a major trigger in some patients.  These can be easily found on [place] or with some retailers.  Fruit of the Loom and Underworks are two brands that are sometimes recommended.    Finally, know that you are not alone. Coping with the pain and other symptoms of HS can be very difficult, so it may be helpful to connect with others who live with HS. Patient groups and networks can be sources of important information and support. Some internet resources for information and connections are provided below.  Resources for Information    The Hidradenitis Suppurativa Foundation: A nonprofit organized by a group of physicians interested in treating and advancing research in hidradenitis suppurativa    American Academy of Dermatology  ARanked.fi    [employer]  ElevatorPitchers.de.html  [place]: [employer] for Rare [employer]  https://www.rarediseases.org/rare-disease-information/rare-diseases/byID/358/viewAbstract  Trials of new medications for HS  https://www.clinicaltrials.gov Shave biopsy   A shave biopsy involves numbing a small area of your skin and then obtaining a sample to help [place] with proper diagnosis or skin condition. Biopsy results typically return in 7 to 14 days.    To care for the area: Leave the bandage in place until the morning after your procedure is performed. On a daily basis, carefully remove the bandage, then shower or wash as usual. Allow water to run over the site. Please do not scrub. Carefully dry the area, then apply ointment (some people develop an allergy to Neosporin, so we recommend Vaseline orAquaphor). Cover the site with a fresh bandage. Should any bleeding occur, apply firm pressure for 15 minutes. The treated site will heal best if  a scab never forms (the wound heals by new skin cells traveling from the outside toward the middle-their journey is easier if no scab stands in their way).    Long-term care: the site will be more sensitive than your surrounding skin. Keep it covered, and remember to apply sunscreen every day to all your exposed skin. A scar may remain which is lighter or pinker than your normal skin. Your body will continue to improve your scar for up to one year.    Infection following this procedure is rare. However, if you are worried about the appearance of your site, contact your doctor. Complete healing may take up to one month. We have a physician on call at all times. If you have any concerns about the site, please call our clinic at [phone]

## 2021-07-05 NOTE — Unmapped (Signed)
Dermatology Note     Assessment and Plan:      Hidradenitis Suppurativa, [place] stage II  - Discussed chronic, relapsing nature of disease and treatment options, including antibiotics, immune modulators, surgery, and laser hair removal.  - Continue Humira 40 mg weekly.  - Continue spironolactone at 75 mg daily alternating with 100 mg daily (had issues with spotting with increase to 100 mg daily)  - Continue triamcinolone 0.1% cream as needed for flares.  ??  High risk medication use (Humira)   - Hepatitis and Quantiferon TB up to date (12/22/2020)  ??  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 Informed verbal consent was obtained. Risk of atrophy  and dyspigmentation with injection was explained. Kenalog 10 mg/ml was injected locally into the sites located mons pubis in a clean fashion following alcohol prep.   Total volume in ml=0.3.  Number of sites treated: 3 injections   Wound care was explained to the patient     Symptomatic mole r/o DN vs MM on R mid back  After verbal informed consent was obtained, this site was cleansed with alcohol, anesthetized with 2% lidocaine with epinephrine and removal to the mid-dermis was performed with a shave tool.  Hemostasis was achieved with aluminum chloride and petrolatum and a bandage were applied.  Post care instructions were given.  We will call the patient with results when available.  Size 5 mm    There are no diagnoses linked to this encounter.    The patient was advised to call for an appointment should any new, changing, or symptomatic lesions develop.     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 Regino Schultze and Dr. Janyth Contes on 05/11/2021) to [facility] for follow-up of hidradenitis suppurativa. At last visit, patient had HS treated with ILK-10 and was to continue humira 40 mg weekly, continue spironolactone 100 mg alternating with 75mg  daily, and continue triamcinolone 0.1% cream prn for flares.  Had a small flare in the groin that resolved on its own a couple of weeks ago.  Notes some itching and sensitivity of scar on L mons from recent deroofing that improved partially with ILK previously and hoping for another injection.  She also notes a mole on the R upper back that is irritated by her bra and has become increasingly itchy over the last few months, which is different than her other moles.    Self-reported severity (0-5): 2  VAS pain today: 3  VAS average pain for the last month: 4  Requiring pain medication? No.  If so, what type/frequency? -  How often in pain?  few times a week  Level of odor (0-5): 3  Level of itching (0-5): 4  Dressing changes needed for drainage:No drainage/less than once weekly  How much drainage: no drainage  Flare in the last month (Y/N)? No.  How long ago was the last flare? in last 6 months  Developing new lesions? less than monthly  Number of inflammatory lesions montly: 1-3  DLQI: 19  Current treatment: see above     How helpful is the current treatment in managing the following aspects of your disease?  Not at all  helpful Somewhat helpful Very helpful   Pain   x   Decreasing length of flares   x   Decreasing new lesions   x   Drainage   x   Decreasing frequency of flares   x   Decreasing severity of flares   x   Odor   x         The patient denies any other new or changing lesions or areas of concern.     Pertinent Past Medical History     No history of skin cancer   Autoimmune arthropathy    Family History:   Negative for melanoma    Past Medical History, Family History, Social History, Medication List, Allergies, and Problem List were reviewed in the rooming section of Epic.     ROS: Other than symptoms mentioned in the HPI, no fevers, chills, or other skin complaints    Physical Examination     Gen: Well-appearing patient, appropriate, interactive, in no acute distress  Skin: Examination of the scalp, face, neck, chest, back, abdomen, bilateral upper and lower extremities, hands, palms, soles, nails, buttocks, and external genitalia performed today and pertinent for:     location Abscess Inflamed nodule Non-inflamed nodule Draining sinus Non-draining Sinus [place] BSA at site Color change  Inflamed induration Open skin surface  Tunnels   R axilla              L axilla              R inframammary              L inframammary              Intermammary              Pubic              R inguinal              R thigh              L inguinal              L thigh              Scrotum/Vulva              Perianal              R buttock              L buttock              Other (list)                            *0=none, 1=mild, 2=moderate, 3=severe  No inflammatory nodules today  Firm smooth hyperpigmented scar of L mons    5mm regularly pigmented papule on R upper back

## 2021-07-06 MED ADMIN — triamcinolone acetonide (KENALOG) injection 10 mg: 10 mg | INTRALESIONAL | @ 02:00:00 | Stop: 2021-07-05

## 2021-07-08 NOTE — Unmapped (Signed)
Teresa Bowman continues to do well on her Humira. She's had no big HS flares, and her existing areas remain improved. If she has a small flare, it goes away quickly. She continues on spironolactone as adjunctive therapy, and she has recently started Victoza in [place] to metformin.     [facility] Clinical Assessment & Refill Coordination Note    Teresa Bowman, Teresa Bowman: 1981-07-13  Phone: [phone] (home)     All above HIPAA information was verified with patient.     Was a Nurse, learning disability used for this call? No    Specialty Medication(s):   Inflammatory Disorders: Humira     Current Outpatient Medications   Medication Sig Dispense Refill   ??? citalopram (CELEXA) 20 MG tablet Take 1 tablet (20 mg total) by mouth daily. 30 tablet 2   ??? clindamycin (CLEOCIN T) 1 % external solution Apply topically daily. 60 mL 11   ??? clobetasoL (TEMOVATE) 0.05 % ointment Twice daily as needed for spot treatment for up to one week on each spot 60 g 5   ??? doxycycline (VIBRAMYCIN) 100 MG capsule Twice daily for 2 weeks for flare 30 capsule 4   ??? DULoxetine (CYMBALTA) 60 MG capsule Take 1 capsule (60 mg total) by mouth daily. 30 capsule 5   ??? empty container Misc Use as directed to dispose of Humira Pens 1 each 3   ??? HUMIRA PEN CITRATE FREE 40 MG/0.4 ML Inject the contents of 1 pen (40 mg total) under the skin every seven (7) days. 4 each 5   ??? HUMIRA PEN CITRATE FREE STARTER PACK FOR CROHN'S/UC/HS 3 X 80 MG/0.8 ML Inject the contents of 2 pens (160 mg) on day 1, THEN 1 pen (80 mg) on day 15. 3 each 0   ??? hydrOXYchloroQUINE (PLAQUENIL) 200 mg tablet Plaquenil 200 mg tablet   Take 1 po bid wiht food     ??? liraglutide (VICTOZA) injection pen Inject 0.1 mL (0.6 mg total) under the skin daily. 6 mL 11   ??? melatonin 5 mg cap Take by mouth.     ??? metFORMIN (GLUCOPHAGE-XR) 500 MG 24 hr tablet Take 2 tablets (1,000 mg total) by mouth daily before breakfast. Take 500 mg once a day, increase by 500 mg every 3 weeks until 2000 mg daily 90 tablet 3   ??? montelukast (SINGULAIR) 10 mg tablet Take 1 tablet (10 mg total) by mouth in the morning. 30 tablet 11   ??? multivitamin (THERAGRAN) per tablet Take 1 tablet by mouth daily.     ??? pen needle, diabetic (PEN NEEDLE) 31 gauge x 5/16 (8 mm) Ndle Use 1 time per day as directed 100 each 11   ??? spironolactone (ALDACTONE) 50 MG tablet Alternate taking 75 mg (1.5 tablets) and 100 mg (2 tablets) daily. 90 tablet 1   ??? traZODone (DESYREL) 50 MG tablet Take 1 tablet (50 mg total) by mouth nightly. (Patient not taking: Reported on 06/01/2021) 30 tablet 0   ??? triamcinolone (KENALOG) 0.1 % cream Twice daily for up to 2 weeks for healing areas 80 g 2     No current facility-administered medications for this visit.        Changes to medications: [name] reports starting the following medications: Victoza, Celexa. med tab already updated.    Allergies   Allergen Reactions   ??? Naltrexone Swelling   ??? Augmentin [Amoxicillin-Pot Clavulanate] Rash   ??? Ceclor [Cefaclor] Rash   ??? Penicillins Rash  Changes to allergies: No    SPECIALTY MEDICATION ADHERENCE     Humira - 0 left  Medication Adherence    Patient reported X missed doses in the last month: 0  Specialty Medication: Humira  Patient is on additional specialty medications: No          Specialty medication(s) dose(s) confirmed: Regimen is correct and unchanged.     Are there any concerns with adherence? No    Adherence counseling provided? Not needed    CLINICAL MANAGEMENT AND INTERVENTION      Clinical Benefit Assessment:    Do you feel the medicine is effective or helping your condition? Yes    Clinical Benefit counseling provided? Not needed    Adverse Effects Assessment:    Are you experiencing any side effects? No    Are you experiencing difficulty administering your medicine? No    Quality of Life Assessment:    Quality of Life    Rheumatology  Oncology  Dermatology  1. What impact has your specialty medication had on the symptoms of your skin condition (i.e. itchiness, soreness, stinging)?: Tremendous  2. What impact has your specialty medication had on your comfort level with your skin?: Tremendous  Cystic Fibrosis          Have you discussed this with your provider? Not needed    Acute Infection Status:    Acute infections noted within Epic:  No active infections  Patient reported infection: None    Therapy Appropriateness:    Is therapy appropriate? Yes, therapy is appropriate and should be continued    DISEASE/MEDICATION-SPECIFIC INFORMATION      For patients on injectable medications: Patient currently has 1 doses left.  Next injection is scheduled for 8/23.    PATIENT SPECIFIC NEEDS     - Does the patient have any physical, cognitive, or cultural barriers? No    - Is the patient high risk? No    - Does the patient require a Care Management Plan? No     - Does the patient require physician intervention or other additional services (i.e. nutrition, smoking cessation, social work)? No      SHIPPING     Specialty Medication(s) to be Shipped:   Inflammatory Disorders: Humira    Other medication(s) to be shipped: No additional medications requested for fill at this time     Changes to insurance: No    Delivery Scheduled: Yes, Expected medication delivery date: Tues, 8/23.     Medication will be delivered via UPS to the confirmed prescription address in [facility].    The patient will receive a drug information handout for each medication shipped and additional FDA Medication Guides as required.  Verified that patient has previously received a Conservation officer, historic buildings and a Surveyor, mining.    The patient or caregiver noted above participated in the development of this care plan and knows that they can request review of or adjustments to the care plan at any time.      All of the patient's questions and concerns have been addressed.    Lanney Gins   [facility] Specialty Pharmacist

## 2021-07-15 DIAGNOSIS — N3941 Urge incontinence: Principal | ICD-10-CM

## 2021-07-22 MED ORDER — DULOXETINE 60 MG CAPSULE,DELAYED RELEASE
ORAL_CAPSULE | Freq: Every day | ORAL | 5 refills | 30 days
Start: 2021-07-22 — End: 2021-10-20

## 2021-07-27 MED ORDER — METFORMIN ER 500 MG TABLET,EXTENDED RELEASE 24 HR
ORAL_TABLET | 0 refills | 0 days
Start: 2021-07-27 — End: ?

## 2021-07-29 MED ORDER — METFORMIN ER 500 MG TABLET,EXTENDED RELEASE 24 HR
ORAL_TABLET | Freq: Two times a day (BID) | ORAL | 0 refills | 60 days | Status: CP
Start: 2021-07-29 — End: ?

## 2021-08-09 DIAGNOSIS — L732 Hidradenitis suppurativa: Principal | ICD-10-CM

## 2021-08-09 MED ORDER — HUMIRA PEN CITRATE FREE STARTER PACK FOR CROHN'S/UC/HS 3 X 80 MG/0.8 ML
0 refills | 0.00000 days | Status: CN
Start: 2021-08-09 — End: ?

## 2021-08-09 MED ORDER — BACLOFEN 5 MG TABLET
ORAL_TABLET | Freq: Every evening | ORAL | 1 refills | 90.00000 days | Status: CP
Start: 2021-08-09 — End: 2021-11-07

## 2021-08-09 MED ORDER — HUMIRA PEN CITRATE FREE 40 MG/0.4 ML
SUBCUTANEOUS | 5 refills | 28.00000 days | Status: CP
Start: 2021-08-09 — End: 2021-09-08
  Filled 2021-08-12: qty 4, 28d supply, fill #0

## 2021-08-09 NOTE — Unmapped (Signed)
[facility] Refill Coordination Note    Specialty Medication(s) to be Shipped:   Inflammatory Disorders: Humira    Other medication(s) to be shipped: No additional medications requested for fill at this time     Teresa Bowman, DOB: January 04, 1981  Phone: [phone] (home)       All above HIPAA information was verified with patient.     Was a Nurse, learning disability used for this call? No    Completed refill call assessment today to schedule patient's medication shipment from the [facility] [phone]).  All relevant notes have been reviewed.     Specialty medication(s) and dose(s) confirmed: Regimen is correct and unchanged.   Changes to medications: Teresa Bowman reports starting the following medications: Celexa  Changes to insurance: No  New side effects reported not previously addressed with a pharmacist or physician: None reported  Questions for the pharmacist: No    Confirmed patient received a Conservation officer, historic buildings and a Surveyor, mining with first shipment. The patient will receive a drug information handout for each medication shipped and additional FDA Medication Guides as required.       DISEASE/MEDICATION-SPECIFIC INFORMATION        For patients on injectable medications: Patient currently has 1 doses left.  Next injection is scheduled for 08/10/21.    SPECIALTY MEDICATION ADHERENCE     Medication Adherence    Patient reported X missed doses in the last month: 0  Specialty Medication: Humira CF 40 mg/0.4 ml  Patient is on additional specialty medications: No  Informant: patient              Were doses missed due to medication being on hold? No    Humira CF 40 mg/0.4 ml: 1 days of medicine on hand        REFERRAL TO PHARMACIST     Referral to the pharmacist: Not needed      [facility]     Shipping address confirmed in Epic.     Delivery Scheduled: Yes, Expected medication delivery date: 08/13/21.     Medication will be delivered via UPS to the prescription address in Epic [place].    Teresa Bowman   [facility] Specialty Technician

## 2021-08-09 NOTE — Unmapped (Signed)
Refill request received for patient.      Medication Requested: Baclofen  Last Office Visit: 10/09/20   Next Office Visit: 08/19/2021  Last Prescriber: Walker Shadow    Please refill if appropriate

## 2021-08-09 NOTE — Unmapped (Signed)
Order ended today hor Humira pen citrate  Free 40 mg/.38ml.  Pended refill request appropriate.

## 2021-08-12 ENCOUNTER — Ambulatory Visit
Admit: 2021-08-12 | Discharge: 2021-08-13 | Payer: PRIVATE HEALTH INSURANCE | Attending: Student in an Organized Health Care Education/Training Program | Primary: Student in an Organized Health Care Education/Training Program

## 2021-08-12 DIAGNOSIS — N898 Other specified noninflammatory disorders of vagina: Principal | ICD-10-CM

## 2021-08-12 DIAGNOSIS — R198 Other specified symptoms and signs involving the digestive system and abdomen: Principal | ICD-10-CM

## 2021-08-12 DIAGNOSIS — M1990 Unspecified osteoarthritis, unspecified site: Principal | ICD-10-CM

## 2021-08-12 DIAGNOSIS — Z6841 Body Mass Index (BMI) 40.0 and over, adult: Principal | ICD-10-CM

## 2021-08-12 MED ORDER — HYDROXYCHLOROQUINE 200 MG TABLET
ORAL_TABLET | Freq: Two times a day (BID) | ORAL | 0 refills | 0.00000 days | Status: CN
Start: 2021-08-12 — End: ?

## 2021-08-12 MED ORDER — DULAGLUTIDE 0.75 MG/0.5 ML SUBCUTANEOUS PEN INJECTOR
SUBCUTANEOUS | 12 refills | 7.00000 days | Status: CP
Start: 2021-08-12 — End: ?

## 2021-08-12 NOTE — Unmapped (Signed)
Error

## 2021-08-12 NOTE — Unmapped (Signed)
Internal Medicine Clinic Visit    Reason for visit: Anal discharge, medication refill    A/P:    Teresa Bowman was seen today for medication refill and vaginal discharge.    Diagnoses and all orders for this visit:    Inflammatory arthritis  -     hydrOXYchloroQUINE (PLAQUENIL) 200 mg tablet; Take 1 tablet (200 mg total) by mouth Two (2) times a day.    Discharge of vagina  -     Vaginitis Screen    Anal discharge  -     Vaginitis Screen    Class 3 severe obesity with serious comorbidity and body mass index (BMI) of 50.0 to 59.9 in adult, unspecified obesity type (CMS-HCC)        Anal vs vaginal discharge  Patient with intermittent abdominal discomfort that would resolve with passing nickle sized clear mucus that she describes has been foul/fishy smelling. Has happened 3x in the past 6 months, most recently 2 weeks ago. Denies associated fevers, chills, peri-rectal skin lesions or drainage. Unfortunately, patient deferred GU/rectal exam so unable to assess if discharge is vaginal vs anal vs surrounding skin. Based on history of fishy order of drainage ddx includes Bacterial vaginosis. Others include mucus associated with BM given foul odor, relief of symptoms with using restroom and passage of mucus. Do not suspect fecal incontinence or anal fistula based on symptoms vs drainage assoc with hydradenitis per patient history vs pilonidal disease though difficult to completely r/o without physical exam. Patient with < 3 loose stools per day so not c/w diarrhea to suspect inflammatory diarrhea that could be presenting as watery stools presenting similar to clear mucus  -R/o bacterial vaginosis; will obtain patient collected vaginal swab   -CTM symptoms - if continues/increases in frequency, will need follow up appointment and recommend patient to complete GU/rectal exam at that time to better guide differential since exam was declined today    Class 3 Severe Obesity   Unable to tolerate Ozempic prior 2/2 GI side effects. Trulicity caused injection site reactions. Has been on Victoza though has been experiencing GI side effects and patient requests switching back to trulicity. Believes injection site reaction may have been associated with inflammation/poorly controlled inflammatory arthritis at the time - now that she is on Humira, would like to trial Trulicity again as she did not have GI side effects with medication  -Continue Metformin  -Discontinue Victoza  -Re-start Trulicity per pt request as above; discussed with patient to monitor for injection site reaction. If present, will need to consider alternative agent     Discussed with Dr. Delane Ginger    Return in about 3 months (around 11/11/2021).; with PCP    Amada Jupiter, MD  Internal Medicine PGY 2  Pager: [phone]    __________________________________________________________    HPI:  40 y.o. female with history of allergic rhinitis, obesity, asthma, anxiety, chronic pain syndrome, fibromyalgia presents with c/f odorous anal discharge.     Has had 3 episodes in last 6 months -  most recently 2 weeks ago. Would experience abdominal discomfort and pass nickle sized clear mucus that is foul/fishy smelling. Once mucus passes, abdominal discomfort resolves. Mucus comes from anus. No incontinence as she would pass mucus on the toilet and not notice any on her underwear. Denies associated skin lesion surrounding anal region or discomfort. Denies vaginal discharge, itching or pain. Has a small hemorrhoid that flares at times though not currently.      Has been having intermittent abdominal pain/GI  issues with current medications. Patient shares she cut back on Metformin to 500 daily was taking 1000 daily before that. At baseline has loose BM about 1-2x/day suspects 2/2 DM medications vs history of gall bladder removal in 2006. BM has been black prior up to a year ago though currently brown, denies BRBPR/melena.    Patient also requests switching back to trulicity from victoza. Having GI side effects and unable to tolerate. Did have localized skin reaction at injection site with trulicity prior though per pt she believes it was related to her body being in an inflammatory state, now that it is more controlled would like to trial again    Denies fevers, chills, N/V, cough, sputum production, CP, SOB, abdominal pain, constipation, melena/BRBPR, hematuria, dysuria, poor PO intake, unintentional W loss    __________________________________________________________    Problem List:  Patient Active Problem List   Diagnosis   ??? 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 Pain in thoracic spine at multiple sites   ??? Allergic rhinitis   ??? Morbid obesity (CMS-HCC)   ??? Anxiety   ??? Chronic pain syndrome   ??? Mild intermittent asthma without complication   ??? Vitamin D deficiency   ??? Mixed incontinence   ??? Anxiety, generalized       Medications:  Reviewed in EPIC  __________________________________________________________    Physical Exam:   Vital Signs:  Vitals:    08/12/21 1440   BP: 109/76   BP Site: L Arm   BP Position: Sitting   BP Cuff Size: X-Large   Pulse: 103   Resp: 18   Temp: 36.6 ??C (97.9 ??F)   TempSrc: Temporal   SpO2: 99%   Weight: (!) 170.1 kg (375 lb)   Height: 167.6 cm (5' 6)       Gen: Well appearing, NAD  CV: RRR, no murmurs  Pulm: CTA bilaterally, no crackles or wheezes  Abd: Soft, NTND, normal BS. No HSM.  GU: Patient deferred vaginal/rectal exam  Ext: No edema

## 2021-08-12 NOTE — Unmapped (Addendum)
It was great seeing you!    We will obtain a vaginosis screen today      [facility]  [address]  [place], [address]  Phone: [phone]  Toll Free: [phone]  Fax: [phone]    Thank you for choosing [facility] for your care.    Important Numbers    Main Clinic: [phone] or toll free [phone]    After Hours, Weekend, or Holidays:  Call the [facility] 24/7 Nursing Line [phone] to get nurse advice.  Go to [facility] walk-in clinic at [address], [place], [place]; [phone]; 7 days a week from 9:00AM - 8:00PM.  Go to [facility] walk-in clinic at [address], [address], [place], [place] [address]; [phone]; 7 days a week from 8:00PM - 8:00PM  Go to [facility] at [facility] at [address], [place], [place]; [phone]; Mon-Fri 8:00AM-7:00PM, Sat-Sun 12:00PM-5:00PM  Go to [facility] Urgent Care for sprains and strains, joint pain, sports injuries and possible fractures at [address], [place], [place]; [phone]; Mon-Thurs 8:00AM-7:00PM, Fri 8:00AM-5:00PM    [facility] at [employer]: [phone]  6th Floor/Internal Medicine Clinic: Ivor Costa [phone]  Ardean Larsen (bilingual): [phone]    [facility] Assistance([place] PAP): [phone], choose option 2  [facility]: [phone] *Pharmacy can mail medications to your home. You must call to request the medication be mailed.[facility]: [phone]  [facility]: [phone]    [facility] Care Management: Are you having trouble with you health because of cost, your mood, trouble getting to clinic, or where you live. Our Care Management team can help!  Available to assist with requests sent via mychart or by calling the main clinic number at [phone]  Monday-Friday 8:00AM - 5:00PM    [facility]  I'm feeling unsafe, have experienced physical abuse, threats, emotional abuse, sexual abuse or other violence. Who do I call for confidential advice and assistance?  Call [phone] Monday through Fridays 9:00am-4:30pm. Call [phone] after hours.    Same Day Clinic   I'm sick today and need an appointment during office hours. Who do I call?  Call [phone], ask for an appointment in the Same Day Clinic  Same Day Clinic is located on the 5th floor at [address] [place] [address]    How do I request medication refills?  Request a refill via [facility] (patient portal), call clinic at [phone] or have your pharmacy fax the request to [phone].    We highly encourage those with internet access to sign up for My [facility] Chart, our new patient portal service.  This service is free to all [facility] patients and offers the following benefits:  Secure messaging with your care team  Request appointments/cancel appointments  Access test results  Request prescription refills  Pay bills online  Manage the health of loved ones  Track your health    Sign up for your My [facility] Chart account at [email or web address].  Free Android and iOs smartphone and tablet applications are also available for your convenience.    How to reset you [facility] Chart  If you forget your My [facility] Chart username or password, select ???forgot username??? or ???forgot password??? located under the ???sign in??? button on the login  page. If you do not remember the information required to reset your password, you can contact [facility] at [phone] to have your My [facility] Chart password reset.    Scheduling appointments Online  The [facility] MyChart secure website and app makes it easy for you to schedule appointments. You can schedule most primary care clinic appointments online with providers you have seen before. Log in to [email or web address] and select Visits to schedule an appointment today

## 2021-08-13 NOTE — Unmapped (Signed)
Immediately after or during the visit, I reviewed with the resident the medical history and the resident’s findings on physical examination.  I discussed with the resident the patient’s diagnosis and concur with the treatment plan as documented in the resident note. Katie Ethan Kasperski, MD

## 2021-08-16 NOTE — Unmapped (Signed)
Chronic Pain Follow Up Note  Assessment and Plan  1. Primary osteoarthritis of both knees    2. Myofascial pain    3. Lumbar radiculopathy      Teresa Bowman is a 40 y.o. being followed at [facility] clinic for complaint of chronic pain localized to lower back with radiation into bilateral lower extremities, thoracic/lumbar myofascial pain, bilateral knee OA L>R with modest response to a host of non-opioid adjuncts.      Knee osteoarthritis  New complaint, uncontrolled. Patient reporting worsening knee pain L>R. Denies instability, clicking. Reports worsening with weight gain. Xray with moderate OA. PE unremarkable but limited secondary to body habitus. Patient amenable to initiation of topamax for possible nerve pain benefit but likewise side effect of weight loss.   - Start topamax 25mg  BID - sent to pharmacy today  - Continue Cymbalta 60mg  daily, refilled  - Continue to see [facility] weight management  - Can consider genicular nerve block in future    Myofascial Pain  Chronic, uncontrolled. Patient continues to have mid-lower back muscle tightness. PE with exquisite tenderness to palpation of thoracic/lumbar paraspinal m. Patient requests repeat TPI as they have been beneficial for her in the past.  - Scheduled repeat TPIs  - Continue Baclofen 5 mg qhs, refilled    Lumbar facet arthropathy; neural foraminal narrowing  Stable. Previous complaints of low back pain with radicular symptoms. MRI from 08/2018 revealing mild multilevel facet arthrosis, most severe at L4-S1 and mild neural foraminal narrowing at L5-S1.  - Continue Gabapentin 50 mg at bedtime as needed    No follow-ups on file.    Future Considerations:  - pain psych    Requested Prescriptions     Signed Prescriptions Disp Refills   ??? topiramate (TOPAMAX) 25 MG tablet 60 tablet 1     Sig: Take 1 tablet (25 mg total) by mouth Two (2) times a day.   ??? DULoxetine (CYMBALTA) 60 MG capsule 30 capsule 2     Sig: Take 1 capsule (60 mg total) by mouth daily.   ??? baclofen (LIORESAL) 5 mg Tab tablet 90 tablet 1     Sig: Take 1 tablet (5 mg total) by mouth at bedtime.     Orders Placed This Encounter   Procedures   ??? Trigger Point Injs 3+ Muscles (94854)     Repeat TPIs; Vear Clock; first available; 30 min; no PIV; no driver     Standing Status:   Future     Standing Expiration Date:   08/19/2022     HPI  Teresa Bowman is a 40 y.o. being [facility] at [facility] for complaint of chronic pain localized to lower back with radiation into LLE.  Patient presents with longstanding axial thoracic and lumbar pain which she feels may be secondary to a labor epidural placed about 8 years ago. Exam seems most consistent with myofascial pain although recently updated lumbar MRI from 08/2018 revealing mild multilevel facet arthrosis, most severe at L4-S1 and mild neural foraminal narrowing at L5-S1. She has seen several other pain physicians in the past with these complaints but has not followed up consistently.     At last visit in November, the patient reported stable analgesia and denied any adverse side effects. Her medications were continued without changes. She endorsed benefit with her past TPI's and requested repeat injections; we placed an order for this. She did not have these done (her last TPI's were on 07/14/20).  Today, reports doing okay. Patient reports not working as much over summer due to heat and has subsequently gained weight. She notes as a result her left knee has been bothering her a lot. Saw her rheumatologist who ordered an xray showing moderate OA. In terms of medications to help with pain. She stopped taking celebrex as she had started [place] and felt she didn't need it anymore. Per patient, was restarted on diclofenac but had experienced chest pain and subsequently stopped taking.Of note, is waiting until November to re-establish care with [facility] (was previously at [facility]). Regarding weight loss, reports current seeing providers at [facility] weight loss clinic although she notes she hasnt seen many changes with recommendations. She reports having started trulicity recently. She would like to repeat TPIs as she was unable to make appointment at last visit and feels they are beneficial to her mid back pain.    In regards to her pain experience:  The patient states her pain is located in back, bilateral hips, and left knee and the severity of her pain ranges from 2/10 to 8/10.  Her pain currently is 4/10 and on average is 4/10.  She describes the sensation of her pain as aching, pulling, sharp, sore, stabbing. Her pain is present all of the time and worst mornings. The patient???s pain impacts enjoyment of life, general activity, normal work, recreational activities, sleep, walking, standing. Her interval history includes PCP visit, change in employment. Her pain is worse, and she does have new pain to discuss today. She is not on blood thinners or anti-coagulants. In regards to medications currently taken for pain management, the patient is tolerating these medications well and complains of associated side effects: drowsiness/sleepiness, weight gain.    Current analgesic regimen:  Celebrex 200mg  BID - reports stopping at 9/29  Cymbalta 60mg  every day  Baclofen 5mg  q HS  Gabapentin 50 mg nightly    Medications tried include:  NSAIDS- ibuprofen (Motrin), meloxicam (did not work), Celebrex (stopped taking due to concern), Naproxen, diclofenac (chest pain)  Antidepressants- Cymbalta (helpful)  Neuroleptics- gabapentin (made her anxious), Lyrica (too sedating), Topamax  Muscle relaxants- metaxalone (Skelaxin), Flexeril, baclofen, robaxin  Topicals- Lidocaine 5% ointment  Short-acting opiates- hydrocodone  Long-acting opiates- None  Anxiolytics- Xanax  Other- LDN (tongue swelling)  ??  Past Interventions:  - TPIs (07/14/20) - beneficial     Allergies  Allergies   Allergen Reactions   ??? Naltrexone Swelling   ??? Augmentin [Amoxicillin-Pot Clavulanate] Rash   ??? Ceclor [Cefaclor] Rash   ??? Penicillins Rash     Home Medications    Current Outpatient Medications   Medication Sig Dispense Refill   ??? citalopram (CELEXA) 20 MG tablet Take 1 tablet (20 mg total) by mouth daily. 30 tablet 2   ??? clindamycin (CLEOCIN T) 1 % external solution Apply topically daily. 60 mL 11   ??? clobetasoL (TEMOVATE) 0.05 % ointment Twice daily as needed for spot treatment for up to one week on each spot 60 g 5   ??? diclofenac (VOLTAREN) 50 MG EC tablet      ??? doxycycline (VIBRAMYCIN) 100 MG capsule Twice daily for 2 weeks for flare 30 capsule 4   ??? dulaglutide (TRULICITY) 0.75 mg/0.5 mL injection pen Inject 0.5 mL (0.75 mg total) under the skin every seven (7) days. 0.5 mL 12   ??? empty container Misc Use as directed to dispose of Humira Pens 1 each 3   ??? HUMIRA PEN CITRATE FREE 40 MG/0.4 ML  Inject the contents of 1 pen (40 mg total) under the skin every seven (7) days. 4 each 5   ??? HUMIRA PEN CITRATE FREE STARTER PACK FOR CROHN'S/UC/HS 3 X 80 MG/0.8 ML Inject the contents of 2 pens (160 mg) on day 1, THEN 1 pen (80 mg) on day 15. 3 each 0   ??? hydrOXYchloroQUINE (PLAQUENIL) 200 mg tablet Take 1 tablet (200 mg total) by mouth Two (2) times a day. 60 tablet 3   ??? melatonin 5 mg cap Take by mouth.     ??? metFORMIN (GLUCOPHAGE-XR) 500 MG 24 hr tablet Take 1 tablet (500 mg total) by mouth Two (2) times a day. 120 tablet 0   ??? montelukast (SINGULAIR) 10 mg tablet Take 1 tablet (10 mg total) by mouth in the morning. 30 tablet 11   ??? multivitamin (THERAGRAN) per tablet Take 1 tablet by mouth daily.     ??? pen needle, diabetic (PEN NEEDLE) 31 gauge x 5/16 (8 mm) Ndle Use 1 time per day as directed 100 each 11   ??? spironolactone (ALDACTONE) 50 MG tablet Alternate taking 75 mg (1.5 tablets) and 100 mg (2 tablets) daily. 90 tablet 1   ??? triamcinolone (KENALOG) 0.1 % cream Twice daily for up to 2 weeks for healing areas 80 g 2   ??? baclofen (LIORESAL) 5 mg Tab tablet Take 1 tablet (5 mg total) by mouth at bedtime. 90 tablet 1   ??? DULoxetine (CYMBALTA) 60 MG capsule Take 1 capsule (60 mg total) by mouth daily. 30 capsule 2   ??? topiramate (TOPAMAX) 25 MG tablet Take 1 tablet (25 mg total) by mouth Two (2) times a day. 60 tablet 1   ??? traZODone (DESYREL) 50 MG tablet Take 1 tablet (50 mg total) by mouth nightly. (Patient not taking: Reported on 06/01/2021) 30 tablet 0     No current facility-administered medications for this visit.     Imaging:  03/23/20 XR Cervical Spine    03/23/20 XR Thoracic Spine    03/23/20 XR Lumbosacral Spine    Lumbar MRI 09/13/18  Mild multilevel facet arthrosis, most severe at L4-L5 and L5-S1 with mild left-sided L5-S1 neural foraminal narrowing. Otherwise, there is no evidence of significant canal or neural foraminal stenosis.    Review Of Systems  General weight gain, night sweats, fatigue, difficulty sleeping, daytime drowsiness  Cardiovascular none  Gastrointestinal bloody stool/black stool  Skin itching  Endocrine increased sweating  Musculoskeletal joint aches/swelling, back pain, muscle aches/weakness  Neurologic excessive sleeping  Psychiatric depression, anxiety, low energy    Physical Exam  VITALS:   Vitals:    08/19/21 1344   BP: 119/82   Pulse: 91   Temp: 36.2 ??Teresa (97.1 ??F)   SpO2: 99%     Wt Readings from Last 6 Encounters:   08/19/21 (!) 169.9 kg (374 lb 8 oz)   08/12/21 (!) 170.1 kg (375 lb)   06/01/21 (!) 164.7 kg (363 lb)   01/29/21 (!) 165.1 kg (364 lb)   10/09/20 (!) 162.6 kg (358 lb 8 oz)   10/07/20 (!) 163.9 kg (361 lb 6.4 oz)     GENERAL:The patient is obese and appears to be in no apparent distress.   HEAD/NECK:  Normocephalic/atraumatic. clear sclera, pupils not pinpoint  CV: warm and well perfused  LUNGS: Normal work of breathing, no supplemental O2  EXTREMITIES:No clubbing, cyanosis noted.  NEUROLOGIC:  The patient is alert and oriented, speech fluent, normal language.   MUSCULOSKELETAL:  Motor  function  preserved. Good range of motion of all extremities.exquisite tenderness to palpation of thoracic/lumbar paraspinal m.  GAIT:The patient rises from a seated position with no difficulty and ambulates with nonantalgic gait without the assistance of a walking aid.   SKIN: No obvious rashes, lesions, or erythema.  PSY: Appropriate affect. No overt pain behaviors. No evidence of psychomotor retardation or agitation, no signs of intoxication.     I saw and examined the patient. I reviewed the Scribe's note and agree with the findings and plan of care as transcribed.    Karma Greaser, MD  PGY-5   Department of Anesthesiology   Pain Medicine Division

## 2021-08-17 ENCOUNTER — Ambulatory Visit: Admit: 2021-08-17 | Payer: PRIVATE HEALTH INSURANCE

## 2021-08-19 ENCOUNTER — Ambulatory Visit
Admit: 2021-08-19 | Discharge: 2021-08-20 | Payer: PRIVATE HEALTH INSURANCE | Attending: Student in an Organized Health Care Education/Training Program | Primary: Student in an Organized Health Care Education/Training Program

## 2021-08-19 ENCOUNTER — Ambulatory Visit
Admit: 2021-08-19 | Discharge: 2021-08-20 | Payer: PRIVATE HEALTH INSURANCE | Attending: Anesthesiology | Primary: Anesthesiology

## 2021-08-19 DIAGNOSIS — M170 Bilateral primary osteoarthritis of knee: Principal | ICD-10-CM

## 2021-08-19 DIAGNOSIS — M7918 Myalgia, other site: Principal | ICD-10-CM

## 2021-08-19 DIAGNOSIS — M5416 Radiculopathy, lumbar region: Principal | ICD-10-CM

## 2021-08-19 MED ORDER — DULOXETINE 60 MG CAPSULE,DELAYED RELEASE
ORAL_CAPSULE | Freq: Every day | ORAL | 2 refills | 30.00000 days | Status: CP
Start: 2021-08-19 — End: 2021-11-17

## 2021-08-19 MED ORDER — BACLOFEN 5 MG TABLET
ORAL_TABLET | Freq: Every evening | ORAL | 1 refills | 90 days | Status: CP
Start: 2021-08-19 — End: 2021-11-17

## 2021-08-19 MED ORDER — TOPIRAMATE 25 MG TABLET
ORAL_TABLET | Freq: Two times a day (BID) | ORAL | 1 refills | 30 days | Status: CP
Start: 2021-08-19 — End: 2021-10-18

## 2021-08-19 NOTE — Unmapped (Signed)
It was nice to see you today!  - We have sent a prescription to your pharmacy to start topamax 25mg  BID; if you find you are feeling tired throughout the day, you are welcome to take 50mg  nightly instead of twice a day  - I have sent in cymbalta refill, and baclofen refill  - We will schedule for trigger point injections

## 2021-08-23 ENCOUNTER — Ambulatory Visit: Admit: 2021-08-23 | Discharge: 2021-08-24 | Payer: PRIVATE HEALTH INSURANCE

## 2021-08-23 DIAGNOSIS — Z79899 Other long term (current) drug therapy: Principal | ICD-10-CM

## 2021-08-23 DIAGNOSIS — L910 Hypertrophic scar: Principal | ICD-10-CM

## 2021-08-23 DIAGNOSIS — L732 Hidradenitis suppurativa: Principal | ICD-10-CM

## 2021-08-23 NOTE — Unmapped (Signed)
Self-reported severity (0-5): 1  VAS pain today: 1  VAS average pain for the last month: 1  Requiring pain medication? Yes.  If so, what type/frequency? -  How often in pain?  continuously  Level of odor (0-5): 2  Level of itching (0-5): 3  Dressing changes needed for drainage:No drainage/less than once weekly  How much drainage: no drainage  Flare in the last month (Y/N)? No.  How long ago was the last flare? in last 6 months  Developing new lesions? less than monthly  Number of inflammatory lesions montly: 1-3  DLQI: 6  Current treatment: Humira     How helpful is the current treatment in managing the following aspects of your disease?  Not at all helpful Somewhat helpful Very helpful   Pain   *   Decreasing length of flares   *   Decreasing new lesions   *   Drainage   *   Decreasing frequency of flares   *   Decreasing severity of flares   *   Odor   *

## 2021-08-23 NOTE — Unmapped (Signed)
Dermatology Note     Assessment and Plan:      Hidradenitis Suppurativa, Hurley stage II  - Discussed chronic, relapsing nature of disease and treatment options, including antibiotics, immune modulators, surgery, and laser hair removal.  - Continue Humira 40 mg weekly.  - Continue spironolactone at 75 mg daily alternating with 100 mg daily (had issues with spotting with increase to 100 mg daily)  - Continue triamcinolone 0.1% cream as needed for flares.    Hypertrophic scar:  After the patient was informed of risks, benefits and side effects of intralesional steroid injection, the patient elected to undergo injection. Informed verbal consent was obtained. Risk of atrophy  and dyspigmentation with injection was explained. Kenalog 10 mg/ml was injected locally into the sites located R pubic area in a clean fashion following alcohol prep.   Total volume in ml=0.3.  Number of sites treated: 3 injections   Wound care was explained to the patient   ??  High risk medication use (Humira)   - Hepatitis and Quantiferon TB up to date (12/22/2020)      The patient was advised to call for an appointment should any new, changing, or symptomatic lesions develop.      RTC: 5 months  _________________________________________________________________      Chief Complaint     Follow-up of HS    HPI     Teresa Bowman. Teresa Bowman is a 40 y.o. female who presents as a returning patient (last seen by Dr. Janyth Contes on 07/05/2021) to [facility] for follow-up of hidradenitis suppurativa. At last visit, patient had HS treated with ILK-10 and was to continue humira 40 mg weekly, continue spironolactone 100 mg alternating with 75mg  daily, and continue triamcinolone 0.1% cream prn for flares.      Today the patient reports that overall her hidradenitis has been pretty calm.  She does still have itching at the hypertrophic scar in the left pubic area from a previous deroofing procedure.  It improved quite a bit with intralesional steroids and she would like to repeat treatment today if possible.  She is tolerating her Humira well.    The patient denies any other new or changing lesions or areas of concern.     Pertinent Past Medical History     No history of skin cancer   Autoimmune arthropathy    Family History:   Negative for melanoma    Past Medical History, Family History, Social History, Medication List, Allergies, and Problem List were reviewed in the rooming section of Epic.     ROS: Other than symptoms mentioned in the HPI, no fevers, chills, or other skin complaints    Physical Examination     Gen: Well-appearing patient, appropriate, interactive, in no acute distress  Skin: Examination of the scalp, face, neck, chest, back, abdomen, bilateral upper and lower extremities, hands, palms, soles, nails, buttocks, and external genitalia performed today and pertinent for:     location Abscess Inflamed nodule Non-inflamed nodule Draining sinus Non-draining Sinus Hurley BSA at site Color change  Inflamed induration Open skin surface  Tunnels   R axilla              L axilla              R inframammary              L inframammary              Intermammary  Pubic              R inguinal              R thigh              L inguinal              L thigh              Scrotum/Vulva              Perianal              R buttock              L buttock              Other (list)                            *0=none, 1=mild, 2=moderate, 3=severe  A firm linear plaque present on the left pubic area  Scribe's Attestation: Elsie Stain, MD obtained and performed the history, physical exam and medical decision making elements that were entered into the chart.  Signed by Jeryl Columbia, on August 23, 2021 at 6:53 AM.    ----------------------------------------------------------------------------------------------------------------------  August 23, 2021 5:36 PM. Documentation assistance provided by the Scribe. I was present during the time the encounter was recorded. The information recorded by the Scribe was done at my direction and has been reviewed and validated by me.  ----------------------------------------------------------------------------------------------------------------------

## 2021-08-24 NOTE — Unmapped (Signed)
You are welcome to join the [facility] for HS of the [employer] group online at [email or web address] or in-person at our regular meetings.  You can textHS to [phone] for meeting reminders or join the group online for regular updates.  This can be a great opportunity to interact and learn from other patients and help work with the HS community.  We hope to see your there!    Hidradentis Suppurativa (pronounced ???high-drad-en-eye-tis/sup-your-uh-tee-vah???) is a chronic disease of hair follicles.  The lesions occur most commonly on areas of skin-to-skin contact: under the arms (axillary area), in the groin, around the buttocks, in the region around the anus and genitals, and on the skin between and under the breasts. In women, the underarms, groin, and breast areas are most commonly affected. Men most often have HS lesions around the anus and under the arms and may also have HS at the back of the neck and behind and around the ears.    What does HS look and feel like?   The first thing that someone with HS notices is a tender, raised, red bump that looks like an under-the-skin pimple or boil. Sometimes HS lesions have two or more ???heads.???  In mild disease only an occasional boil or abscess may occur, but in more active disease there can be many new lesions every month.  Some abscesses can become larger and may open and drain pus.  Bleeding and increased odor can also occur. In severe disease, deeper abscesses develop and may connect with each other under the skin to form tunnel-like tracts (sinuses, fistulas).  These may drain constantly, or may temporarily improve and then usually begin draining again over time.  In people who have had sinus tracts for some time, scars form that feel like ropes under the skin. In the very worst cases, networks of sinus tracts can form deeper in the body, including the muscle and other tissues. Many people with severe HS have scars that can limit their ability to freely move their arms or legs, though this is very unlikely for most patients.     Clinicians usually classify or ???grade??? HS using the [facility] staging system according to the severity of the disease for each body location:  ??? Hurley stage I: one or more abscesses are present, but no sinus tracts have formed and no scars have developed  ??? Hurley stage II: one or more abscesses are present that resolve and recur; on sinus tract can be present and scarring is seen  ??? Hurley stage III: many abscesses and more than one sinus tract is present with extensive scars.    What causes HS?  The cause of HS is not completely understood.  It seems to be a disorder of hair follicles and often many family members are affected so genetics probably play a strong role.  Bacteria are often present and may make the disease worse, but infection does not seem to be the main cause. Hormones are also likely play a role since the condition typically starts around puberty when hair follicles under the arms and in the groin start to change.  It can sometimes flare with menstrual cycles in women as well.  In most cases it lasts for decades and starts to improve to some extent in the late 30s and 40s as long as many fistulas have not already formed.  Women are three times more likely than men to develop HS.    Other factors are known to contribute to HS  flaring or becoming worse, though they are likely not the main causes. The factors most commonly associated with HS include:  ??? Cigarette smoking - this is very highly linked.  Stopping smoking will likely not cure the disease, but likely is helpful in reducing how much and how often it flares.  ??? Obesity - HS may occur even in people that are not overweight, but it is much more common in patients that are.  There is some evidence that losing weight and eating a diet low in sugars and fats may be helpful in improving hidradenitis, though this is not helpful for everyone.  Working with a nutritionist may be an important way to help with this and is something your physician can help coordinate    Hidradenitis is not contagious.  It is not caused by a problem with personal hygiene or any other activity or behavior of those with the disease.    How can your doctor help you treat your hidradenitis?  Clinicians use both medication and surgery to treat HS. The choice of treatment???or combination of treatments???is made according to an individual patient???s needs. Clinicians consider several factors in determining the most appropriate plan for therapy:  ??? Severity of disease - medications and some laser treatments are usually able to control disease best when fistulas are not present.  Fistulas typically require surgery.  ??? Extent and location of disease  ??? Chronicity (how often the lesions recur)    A number of different surgical methods have been developed that are useful for certain patients under particular circumstances. These can be done with local numbing and healing at home for some areas when disease is not too extensive with relatively brief recovery times.  In more extensive disease there may be a need for larger excisions under general anesthesia with healing time in the hospital and prolonged recovery periods for better disease control.      In addition, many medical treatments have been tried???some with more success than others. No medication is effective for all patients, and you and your doctor may have to try several different agents or combinations of agents before you find the treatment plan that works best for you.  The goals of therapy with medications that are either topical (used on the skin) or systemic (taken by mouth) are:  1. to clear the lesions or at least reduce their number and extent, and  2. to prevent new lesions from forming.  3. To reduce pain, drainage, and odor  Some of the types of medications commonly used are antibacterial skin washes and the topical antibiotics to prevent secondary infections and corticosteroid injections into the lesions to reduce inflammation.     Other medications that may be used include retinoids (similar to Accutane), drugs that effect how hormones and hair follicles interact, drugs that affect your immune system (such as methotrexate, adalimumab/Humira, and Remicaid/infliximab), steroids, and oral antibiotics.    Lasers that destroy hair follicles can also be helpful since they reduce the hair follicles that cause the problems.  Multiple treatments are typically required over time and there is some discomfort associated with treatment, but it is typically very fast and well-tolerated.    It is very important to realize that hidradenitis cannot be completely cured with any single medication or surgical procedure.  It is a disease that can be very stubborn and difficult to control, but with good treatment a lot of improvement and sometimes temporary remissions can be obtained. Poorly controlled disease can cause  more fistulas to form and make managing the disease much more difficult over time so it is important to seek care to reduce major flares.  Surgery can provide a long term cure in some areas, though the disease can start again or continue in nearby areas.  A dermatologist is often the best person to help coordinate disease treatment, and sometimes other surgeons, pain specialists, other specialists, and nutritionists may be part of the treatment team.    What can you do to help your HS?  1. If your are a smoker, then stopping can probably be helpful.  Your dermatologist will be happy to refer you to some one who can help with this.  2. Follow a healthy diet and try to achieve a healthy weight  Some other self-help measures are:  ??? Keep your skin cool and dry (becoming overheated and sweating can contribute to an HS flare)  ??? To reduce the pain of cysts or nodules or to help them to drain, apply hot compresses or soak in hot water for 10 minutes at a time (use a clean washcloth or a teabag soaked in hot water)  ??? For female patients, cotton underwear that does not have tight elastic in the groin can be helpful.  Boyshort, brief, or boxer style underwear may be a better option as friction on hair follicles in affected areas can be a major trigger in some patients.  These can be easily found on [place] or with some retailers.  Fruit of the Loom and Underworks are two brands that are sometimes recommended.    Finally, know that you are not alone. Coping with the pain and other symptoms of HS can be very difficult, so it may be helpful to connect with others who live with HS. Patient groups and networks can be sources of important information and support. Some internet resources for information and connections are provided below.  Resources for Information    The Hidradenitis Suppurativa Foundation: A nonprofit organized by a group of physicians interested in treating and advancing research in hidradenitis suppurativa    American Academy of Dermatology  ARanked.fi    [employer]  ElevatorPitchers.de.html  NORD: IT trainer for [employer]  https://www.rarediseases.org/rare-disease-information/rare-diseases/byID/358/viewAbstract  Trials of new medications for HS  Https://www.clinicaltrials.gov

## 2021-09-03 NOTE — Unmapped (Signed)
[facility] Refill Coordination Note    Specialty Medication(s) to be Shipped:   Inflammatory Disorders: Humira    Other medication(s) to be shipped: No additional medications requested for fill at this time     Teresa Bowman, DOB: 01/03/1981  Phone: [phone] (home)       All above HIPAA information was verified with patient.     Was a Nurse, learning disability used for this call? No    Completed refill call assessment today to schedule patient's medication shipment from the [facility] [phone]).  All relevant notes have been reviewed.     Specialty medication(s) and dose(s) confirmed: Regimen is correct and unchanged.   Changes to medications: Maysen reports no changes at this time.  Changes to insurance: No  New side effects reported not previously addressed with a pharmacist or physician: None reported  Questions for the pharmacist: No    Confirmed patient received a Conservation officer, historic buildings and a Surveyor, mining with first shipment. The patient will receive a drug information handout for each medication shipped and additional FDA Medication Guides as required.       DISEASE/MEDICATION-SPECIFIC INFORMATION        For patients on injectable medications: Patient currently has 1 doses left.  Next injection is scheduled for 09/07/2021.    SPECIALTY MEDICATION ADHERENCE     Medication Adherence    Patient reported X missed doses in the last month: 0  Specialty Medication: Humira CF 40 mg/0.4 ml  Patient is on additional specialty medications: No  Any gaps in refill history greater than 2 weeks in the last 3 months: no  Demonstrates understanding of importance of adherence: yes  Informant: patient  Reliability of informant: reliable  Confirmed plan for next specialty medication refill: delivery by pharmacy  Refills needed for supportive medications: not needed              Were doses missed due to medication being on hold? No    Humira CF 40 mg/0.4 ml: 7 days of medicine on hand REFERRAL TO PHARMACIST     Referral to the pharmacist: Not needed      [facility]     Shipping address confirmed in Epic.     Delivery Scheduled: Yes, Expected medication delivery date: 09/08/2021.     Medication will be delivered via UPS to the prescription address in Epic WAM.    Quadre Bristol D Spencer Peterkin   [facility] Specialty Technician

## 2021-09-07 MED FILL — HUMIRA PEN CITRATE FREE 40 MG/0.4 ML: SUBCUTANEOUS | 28 days supply | Qty: 4 | Fill #1

## 2021-09-17 ENCOUNTER — Ambulatory Visit: Admit: 2021-09-17 | Discharge: 2021-09-17 | Payer: PRIVATE HEALTH INSURANCE

## 2021-09-20 DIAGNOSIS — R928 Other abnormal and inconclusive findings on diagnostic imaging of breast: Principal | ICD-10-CM

## 2021-09-20 DIAGNOSIS — Z1231 Encounter for screening mammogram for malignant neoplasm of breast: Principal | ICD-10-CM

## 2021-09-21 NOTE — Unmapped (Signed)
Patient scheduled for procedure appointment  on 09/22/21 at 13:30 with Dr. Vear Clock.    Two phone calls to patient.   First call rang twice and then disconnected.  The second call resulted in prompt to leave voice message (patient identified voice mail box). The mail box was full and unable to leave message.

## 2021-09-22 ENCOUNTER — Ambulatory Visit
Admit: 2021-09-22 | Discharge: 2021-09-23 | Payer: PRIVATE HEALTH INSURANCE | Attending: Obstetrics & Gynecology | Primary: Obstetrics & Gynecology

## 2021-09-22 DIAGNOSIS — T8332XA Displacement of intrauterine contraceptive device, initial encounter: Principal | ICD-10-CM

## 2021-09-22 DIAGNOSIS — Z01419 Encounter for gynecological examination (general) (routine) without abnormal findings: Principal | ICD-10-CM

## 2021-09-22 DIAGNOSIS — L732 Hidradenitis suppurativa: Principal | ICD-10-CM

## 2021-09-22 MED ORDER — SPIRONOLACTONE 50 MG TABLET
ORAL_TABLET | 1 refills | 0.00000 days | Status: CP
Start: 2021-09-22 — End: ?

## 2021-09-22 NOTE — Unmapped (Signed)
SUBJECTIVE     PCP:  Kathrin Ruddy, MD    HPI:  Roniqua Kintz is a 40 y.o. [phone].  No LMP recorded. Patient has had an implant.  She presents today for annual exam and IUD check. She is without complaint. She reports occasional spotting    GYNHx:   Menses:   rare   Last Pap:  02/2020 NILM HPV HR negative   Hx of STI's:   History of Herpes Simplex Virus - HSV   Last MMG:    08/2021. Additional views required   Sexually active:   yes      Contraception:  IUD    HEALTH MAINTENANCE:     Health Maintenance Due   Topic Date Due   ??? DTaP/Tdap/Td Vaccines (1 - Tdap) Never done   ??? COVID-19 Vaccine (4 - Booster for Pfizer series) 10/14/2020   ??? Influenza Vaccine (1) 07/22/2021      SBE:    no    OB History   Gravida Para Term Preterm AB Living   2 2 2     2    SAB IAB Ectopic Molar Multiple Live Births             2      # Outcome Date GA Lbr Len/2nd Weight Sex Delivery Anes PTL Lv   2 Term 2011 [redacted]w[redacted]d  4139 g (9 lb 2 oz) M Vag-Spont EPI N LIV   1 Term 2003 [redacted]w[redacted]d  3600 g (7 lb 15 oz) M Vag-Spont EPI  LIV      Obstetric Comments   Menarche at age:   Last PAP:  2019   Abnormal PAP history:  yes, 2002   BCM:  IUD             Past Medical History:   Diagnosis Date   ??? Abnormal Pap smear of cervix     ?2002   ??? Acute venous embolism and thrombosis of other specified veins(453.89)     ovarian vein thrombosis   ??? Allergic rhinitis    ??? Anxiety    ??? Anxiety    ??? Asthma     mild   ??? Bilateral low back pain with left-sided sciatica 10/06/2014   ??? Chronic pain syndrome     due to lumbar disk disease   ??? Clotting disorder (CMS-HCC)    ??? Decreased libido    ??? Disorder of skin or subcutaneous tissue    ??? Fibromyalgia    ??? Insomnia    ??? Inverted nipple     left nipple inverts x several years   ??? Morbid obesity (CMS-HCC)    ??? Numbness     left side   ??? Obesity    ??? Pain in thoracic spine at multiple sites 10/06/2014   ??? Spontaneous vaginal delivery    ??? Urinary frequency    ??? Urinary incontinence    ??? Venous insufficiency    ??? Weakness     left side       Past Surgical History:   Procedure Laterality Date   ??? CHOLECYSTECTOMY     ??? CRYOTHERAPY      cervix   ??? PR EXCISION TURBINATE,SUBMUCOUS Bilateral 09/16/2017    Procedure: BILATERAL MAXILLARY ANTROSTOMIES;  Surgeon: Meryle Ready, MD;  Location: OR [place];  Service: ENT   ??? Resection of hidaradentits     ??? TONSILLECTOMY         SOCIAL HISTORY:    Social History  Tobacco Use   Smoking Status Former Smoker   ??? Types: Cigarettes   Smokeless Tobacco Never Used     Social History     Substance and Sexual Activity   Alcohol Use Yes   ??? Alcohol/week: 2.0 standard drinks   ??? Types: 2 Shots of liquor per week    Comment: occasionally     Substance and Sexual Activity   Alcohol Use Yes   ??? Alcohol/week: 2.0 standard drinks   ??? Types: 2 Shots of liquor per week    Comment: occasionally        Substance and Sexual Activity   Drug Use No       Family History   Problem Relation Age of Onset   ??? Diabetes Father    ??? Arthritis Father    ??? Breast cancer Maternal Grandmother    ??? Cancer Maternal Grandmother    ??? Breast cancer Paternal Grandmother    ??? Cancer Paternal Grandmother    ??? Multiple sclerosis Mother    ??? Hip fracture Mother    ??? Arthritis Brother         Reiter's   ??? Squamous cell carcinoma Neg Hx    ??? Basal cell carcinoma Neg Hx    ??? Melanoma Neg Hx        ALLERGIES:    Naltrexone, Augmentin [amoxicillin-pot clavulanate], Ceclor [cefaclor], and Penicillins    Current Outpatient Medications   Medication Sig Dispense Refill   ??? citalopram (CELEXA) 20 MG tablet Take 1 tablet (20 mg total) by mouth daily. 30 tablet 2   ??? clindamycin (CLEOCIN T) 1 % external solution Apply topically daily. 60 mL 11   ??? clobetasoL (TEMOVATE) 0.05 % ointment Twice daily as needed for spot treatment for up to one week on each spot 60 g 5   ??? doxycycline (VIBRAMYCIN) 100 MG capsule Twice daily for 2 weeks for flare 30 capsule 4   ??? dulaglutide (TRULICITY) 0.75 mg/0.5 mL injection pen Inject 0.5 mL (0.75 mg total) under the skin every seven (7) days. 0.5 mL 12   ??? DULoxetine (CYMBALTA) 60 MG capsule Take 1 capsule (60 mg total) by mouth daily. 30 capsule 2   ??? HUMIRA PEN CITRATE FREE 40 MG/0.4 ML Inject the contents of 1 pen (40 mg total) under the skin every seven (7) days. 4 each 5   ??? HUMIRA PEN CITRATE FREE STARTER PACK FOR CROHN'S/UC/HS 3 X 80 MG/0.8 ML Inject the contents of 2 pens (160 mg) on day 1, THEN 1 pen (80 mg) on day 15. 3 each 0   ??? hydrOXYchloroQUINE (PLAQUENIL) 200 mg tablet Take 1 tablet (200 mg total) by mouth Two (2) times a day. 60 tablet 3   ??? melatonin 5 mg cap Take by mouth.     ??? metFORMIN (GLUCOPHAGE-XR) 500 MG 24 hr tablet Take 1 tablet (500 mg total) by mouth Two (2) times a day. 120 tablet 0   ??? montelukast (SINGULAIR) 10 mg tablet Take 1 tablet (10 mg total) by mouth in the morning. 30 tablet 11   ??? multivitamin (THERAGRAN) per tablet Take 1 tablet by mouth daily.     ??? spironolactone (ALDACTONE) 50 MG tablet ALTERNATE TAKING 1 AND 1/2 TABLETS (75 MG TOTAL) AND 2 TABLETS (100 MG TOTAL) BY MOUTH DAILY 90 tablet 1   ??? topiramate (TOPAMAX) 25 MG tablet Take 1 tablet (25 mg total) by mouth Two (2) times a day. 60 tablet 1   ??? triamcinolone (  KENALOG) 0.1 % cream Twice daily for up to 2 weeks for healing areas 80 g 2   ??? baclofen (LIORESAL) 5 mg Tab tablet Take 1 tablet (5 mg total) by mouth at bedtime. 90 tablet 1   ??? diclofenac (VOLTAREN) 50 MG EC tablet      ??? empty container Misc Use as directed to dispose of Humira Pens 1 each 3   ??? pen needle, diabetic (PEN NEEDLE) 31 gauge x 5/16 (8 mm) Ndle Use 1 time per day as directed 100 each 11   ??? traZODone (DESYREL) 50 MG tablet Take 1 tablet (50 mg total) by mouth nightly. (Patient not taking: Reported on 06/01/2021) 30 tablet 0     No current facility-administered medications for this visit.       Review of Systems    General:                      none  Gyn:                            none  Skin:                            none  HEENT: Sinus problems  Lymph:                        none  Breasts:                       none  Respiratory:                none  Cardiovascular:           none  GI:                               none      Urinary:                       Frequency  Musculoskeletal:         Joint Pain  Endocrine:                   none  Psychological:             none    OBJECTIVE     PE:  BP 117/80  - Pulse 89  - Wt (!) 172.8 kg (381 lb)  - BMI 61.50 kg/m??   GEN:   WD, WN, NAD  HEENT:   No thyroidmegally, no nodules or tenderness.   CHEST:   Clear to ascultation bilaterally  CV:   Regular rate and rhythm without murmur, rub or gallop  BREAST:   No dominant mass.  No lymphadenopathy in axilla. No nipple discharge, retractions or dimpling. Mild tenderness without mass of inner upper quadrant of left breast  ABD:    NT, ND.  Soft, no masses.  No hepatosplenomegaly.  No hernias noted.  EXTR:   No clubbing, cyanosis or edema    PELVIC:    EG/BUS: Scarring from previous procedures  Vagina:  Without lesions or abnormal discharge  Cervix:  Grossly normal. Strings not visible  Uterus:   Normal size mobile, NT,   Adnexa:  No adnexal masses or tenderness.    Pelvic Floor: Non tender  Bedside US: IUD suspected but not clearly identified    UPT: negative    ASSESSMENT AND PLAN     Well woman exam with routine gynecological exam  - Pap current  - Encouraged follow up Mammogram as recommended  - tdap and COVID vaccines current  -      INFLUENZA VACCINE (QUAD) IM - 6 MO-ADULT - PF    Intrauterine contraceptive device threads lost, initial encounter  -     POCT Pregnancy Urine, Qualitative  -     [place] Endovaginal (Non-OB); Future

## 2021-09-22 NOTE — Unmapped (Signed)
Requested Prescriptions     Pending Prescriptions Disp Refills   ??? spironolactone (ALDACTONE) 50 MG tablet [Pharmacy Med Name: SPIRONOLACTONE 50 MG TAB] 90 tablet 1     Sig: ALTERNATE TAKING 1 AND 1/2 TABLETS (75 MG TOTAL) AND 2 TABLETS (100 MG TOTAL) BY MOUTH DAILY     Recent Visits  Date Type Provider Dept   08/23/21 Office Visit Elsie Stain, MD [facility] [place]   08/12/21 Office Visit Arlean Hopping, MD [facility]   07/05/21 Office Visit Elsie Stain, MD [place]   05/11/21 Office Visit Elsie Stain, MD [facility] [place]   04/20/21 Office Visit Elsie Stain, MD [facility] [place]   02/23/21 Office Visit Elsie Stain, MD [facility] [place]   01/29/21 Office Visit Kathrin Ruddy, MD [facility] [place]   01/18/21 Office Visit Elsie Stain, MD [facility]   12/22/20 Office Visit Elsie Stain, MD [facility] [place]   10/07/20 Office Visit Kathrin Ruddy, MD [place] [facility]   Showing recent visits within past 540 days with a meds authorizing provider and meeting all other requirements  Future Appointments  Date Type Provider Dept   01/10/22 Appointment Elsie Stain, MD [facility] [place]   Showing future appointments within next 150 days with a meds authorizing provider and meeting all other requirements

## 2021-09-28 ENCOUNTER — Ambulatory Visit: Admit: 2021-09-28 | Discharge: 2021-09-29 | Payer: PRIVATE HEALTH INSURANCE

## 2021-09-28 DIAGNOSIS — T8332XA Displacement of intrauterine contraceptive device, initial encounter: Principal | ICD-10-CM

## 2021-09-29 NOTE — Unmapped (Signed)
[facility] Refill Coordination Note    Specialty Medication(s) to be Shipped:   Inflammatory Disorders: Humira    Other medication(s) to be shipped: No additional medications requested for fill at this time     Teresa Bowman, DOB: 1981-07-18  Phone: [phone] (home)       All above HIPAA information was verified with patient.     Was a Nurse, learning disability used for this call? No    Completed refill call assessment today to schedule patient's medication shipment from the [facility] [phone]).  All relevant notes have been reviewed.     Specialty medication(s) and dose(s) confirmed: Regimen is correct and unchanged.   Changes to medications: Teresa Bowman reports no changes at this time.  Changes to insurance: No  New side effects reported not previously addressed with a pharmacist or physician: None reported  Questions for the pharmacist: No    Confirmed patient received a Conservation officer, historic buildings and a Surveyor, mining with first shipment. The patient will receive a drug information handout for each medication shipped and additional FDA Medication Guides as required.       DISEASE/MEDICATION-SPECIFIC INFORMATION        For patients on injectable medications: Patient currently has 0 doses left.  Next injection is scheduled for 10/05/21.    SPECIALTY MEDICATION ADHERENCE     Medication Adherence    Patient reported X missed doses in the last month: 0  Specialty Medication: HUMIRA  Patient is on additional specialty medications: No              Were doses missed due to medication being on hold? No    Humira 40/0.4 mg/ml: 0 days of medicine on hand        REFERRAL TO PHARMACIST     Referral to the pharmacist: Not needed      Teresa Bowman     Shipping address confirmed in Epic.     Delivery Scheduled: Yes, Expected medication delivery date: 10/01/21.     Medication will be delivered via UPS to the prescription address in Epic WAM.    Teresa Bowman   Teresa Bowman

## 2021-09-30 MED ORDER — SPIRONOLACTONE 100 MG TABLET
ORAL_TABLET | Freq: Every day | ORAL | 3 refills | 90.00000 days
Start: 2021-09-30 — End: 2022-09-30

## 2021-09-30 MED FILL — HUMIRA PEN CITRATE FREE 40 MG/0.4 ML: SUBCUTANEOUS | 28 days supply | Qty: 4 | Fill #2

## 2021-10-01 ENCOUNTER — Ambulatory Visit: Admit: 2021-10-01 | Discharge: 2021-10-02 | Payer: PRIVATE HEALTH INSURANCE

## 2021-10-01 DIAGNOSIS — N83201 Unspecified ovarian cyst, right side: Principal | ICD-10-CM

## 2021-10-01 DIAGNOSIS — R928 Other abnormal and inconclusive findings on diagnostic imaging of breast: Principal | ICD-10-CM

## 2021-10-01 MED ORDER — SPIRONOLACTONE 100 MG TABLET
ORAL_TABLET | Freq: Every day | ORAL | 3 refills | 90.00000 days | Status: CP
Start: 2021-10-01 — End: 2022-10-01

## 2021-10-01 NOTE — Unmapped (Signed)
Please contact patient to inform us showed IUD is in the appropriate location. It also showed a cyst of the right ovary which is most likely benign. Recommend repeat US in 6-12 months to make sure cyst has gone away. Study has been ordered. If develops pelvic pain, then contact us as this may be due to cyst  Please close encounter when complete  Thanks

## 2021-10-01 NOTE — Unmapped (Signed)
Called received from [facility] pharmacy and states that they have three different orders for spironolactone 50mg , 75mg  and 100mg  and wanted to know which one was correct.  Nurse called pharmacy back and explained to them that pt had requested to get back on the higher dose since she no longer had side effects from medication, and therefore they could fill the spironolactone 100mg  that was sent in today by provider.

## 2021-10-01 NOTE — Unmapped (Signed)
Pt notified per message below. Pt verbalized understanding.

## 2021-10-02 NOTE — Unmapped (Signed)
Correct.  Thanks so much.

## 2021-10-12 ENCOUNTER — Ambulatory Visit: Admit: 2021-10-12 | Discharge: 2021-10-12 | Payer: PRIVATE HEALTH INSURANCE

## 2021-10-12 DIAGNOSIS — M1990 Unspecified osteoarthritis, unspecified site: Principal | ICD-10-CM

## 2021-10-12 LAB — CBC W/ AUTO DIFF
BASOPHILS ABSOLUTE COUNT: 0.1 10*9/L (ref 0.0–0.1)
BASOPHILS RELATIVE PERCENT: 0.4 %
EOSINOPHILS ABSOLUTE COUNT: 0.3 10*9/L (ref 0.0–0.5)
EOSINOPHILS RELATIVE PERCENT: 2.4 %
HEMATOCRIT: 38.7 % (ref 34.0–44.0)
HEMOGLOBIN: 13 g/dL (ref 11.3–14.9)
LYMPHOCYTES ABSOLUTE COUNT: 3.4 10*9/L (ref 1.1–3.6)
LYMPHOCYTES RELATIVE PERCENT: 23.4 %
MEAN CORPUSCULAR HEMOGLOBIN CONC: 33.5 g/dL (ref 32.0–36.0)
MEAN CORPUSCULAR HEMOGLOBIN: 30.2 pg (ref 25.9–32.4)
MEAN CORPUSCULAR VOLUME: 90.1 fL (ref 77.6–95.7)
MEAN PLATELET VOLUME: 6.9 fL (ref 6.8–10.7)
MONOCYTES ABSOLUTE COUNT: 1.1 10*9/L — ABNORMAL HIGH (ref 0.3–0.8)
MONOCYTES RELATIVE PERCENT: 7.5 %
NEUTROPHILS ABSOLUTE COUNT: 9.7 10*9/L — ABNORMAL HIGH (ref 1.8–7.8)
NEUTROPHILS RELATIVE PERCENT: 66.3 %
PLATELET COUNT: 341 10*9/L (ref 150–450)
RED BLOOD CELL COUNT: 4.3 10*12/L (ref 3.95–5.13)
RED CELL DISTRIBUTION WIDTH: 12.8 % (ref 12.2–15.2)
WBC ADJUSTED: 14.5 10*9/L — ABNORMAL HIGH (ref 3.6–11.2)

## 2021-10-12 LAB — BUN: BLOOD UREA NITROGEN: 9 mg/dL (ref 9–23)

## 2021-10-12 LAB — ALBUMIN: ALBUMIN: 3.9 g/dL (ref 3.4–5.0)

## 2021-10-12 LAB — CREATININE
CREATININE: 0.61 mg/dL
EGFR CKD-EPI (2021) FEMALE: >90 mL/min/{1.73_m2} (ref >=60)

## 2021-10-12 LAB — AST: AST (SGOT): 12 U/L (ref <=34)

## 2021-10-12 LAB — ALT: ALT (SGPT): 13 U/L (ref 10–49)

## 2021-10-12 MED ORDER — INDOMETHACIN ER 75 MG CAPSULE,EXTENDED RELEASE
ORAL_CAPSULE | Freq: Every day | ORAL | 4 refills | 30 days | Status: CP
Start: 2021-10-12 — End: 2022-10-12

## 2021-10-12 MED ORDER — HYDROXYCHLOROQUINE 200 MG TABLET
ORAL_TABLET | Freq: Two times a day (BID) | ORAL | 3 refills | 30.00000 days | Status: CP
Start: 2021-10-12 — End: ?

## 2021-10-12 NOTE — Unmapped (Signed)
Teresa Bowman - Initial Consultation      Assessment/Recommendations:   40 y.o.  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  She carries a prior diagnosis of fibromyalgia and seronegative inflammatory arthritis (dx ~ 2021 by Teresa Bowman based on h/o elevated inflammatory markers, joint stiffness (morning stiffness) and her h/o hidradenitis.  She was started on HCQ 200 mg BID for this.  Her serologic work-up has been negative thus far. No reported h/o radiographic inflammatory arthritis but records incomplete.  Now on Humira, primarily for her HS, with some improvement in her stiffness.  Unfortunately, continues to report diffuse, wide spread MSK pain that is most consistent with myofascial pain syndrome / fibromyalgia.  Exam without evidence of inflammatory arthritis today.  Discussed with patient that I recommend obtaining work-up as outlined below. I would advise against altering current regimen at this time as I suspect a large portion of her pain is related to non-inflammatory pain etiologies. The nature of fibromyalgia was was discussed in detail with the patient.  We reviewed the importance of regular low level aerobic exercise, good restorative sleep, and management of symptoms of anxiety and depression.   She will follow up with her PCP for management of of this.     - CBC w/ diff, Cr, AST, ALT, albumin   - B/l SI joint film  - Continue HCQ 200 mg BID; encouraged yearly eye exam   - Agree with Humira 40 mg subcutaneous qweek as prescribed by dermatology; possible that this medication has helped with an inflammatory arthritis component to her pain as her stiffness has improved; however, this remains unclear and would recommend against changing this therapy due to ongoing symptoms as she has had a good response with regard to her skin disease   - follow-up with pain management and/or PCP to address fibromyalgia symptoms     TREATMENT STRATEGIES FOR FIBROMYALGIA    Non-Pharmacologic (without medications) Pharmacologic  (with medications)   3 main treatments for fibromyalgia  1. Good restorative sleep-- Discuss sleep hygiene, consider sleep study to evaluate other issues that may hinder sleep (like sleep apnea)  2. Regular, graded, low impact cardiovascular exercise--Walking/biking etc. Water aerobics can be particularly helpful.    3. Management of any co-morbid depression/anxiety. Consider referral to psychiatry if needed.     Other helpful options  1. Pain psychology/counseling/ cognitive behavioral therapy (CBT)  2. Mindfulness Training- Available through the [facility] Medicine clinic (patient can call to inquire or schedule appointment [phone])  3. Physical therapy/ exercise therapy   4. Massage therapy  5. Acupuncture   6. Tai Chi  7. Yoga FDA approved medications  1. Lyrica (pregabalin)   2. Cymbalta (duloxetine)  3. Savella (milnacipran)    Medications used ???off-label,??? meaning not FDA approved, but are used often for fibromyalgia  - SNRI Antidepressants   1. Venlafaxine  - Tri-cyclic antidepressants (particularly helpful for sleep)  1. Amitriptyline   2. Nortriptyline   - Anticonvulsants (anti-seizure)  1. Gabapentin  2. Topamax (topiramate)- rarely used, but occasionally helpful  - NSAID medications   - Muscle relaxers  -  Analgesics  1. Tylenol  2. Lidocaine patches  3. Mentholated creams (like Icy-Hot or BioFreeze)    **  Tip: Fibromyalgia patients are susceptible to low vitamin D levels.  They are sometimes more sensitive to low vitamin D, which can increase fatigue and overall aching.  Supplementation of vitamin D when low can be helpful in managing symptoms.  ** Opioid pain medications, like oxycodone and hydrocodone, are not recommended for the long term management of fibromyalgia pain.        RTC in 6 months  or sooner prn.        We appreciate the opportunity to participate in the care of this patient. Total duration of the visit was 60 minutes , and > than 50% of the visit is spent in face-to-face counseling focusing on diagnosis and therapeutic options. The above plan was discussed at length with the patient who expresses clear understanding and is agreeable.    ___________________________________________________________________    Referring Provider:  Jeannette Bowman    Primary Care Provider: Kathrin Ruddy, Bowman    Reason for Visit:  Transfer of care,  H/o inflammatory arthritis     HPI:  Teresa Bowman is a 40 y.o. obese woman with history of fibromyalgia, Hidradenitis Suppurativa,??Hurley stage??II (on Humira and Spironolactone; follows with Teresa Bowman) presenting upon referral by Teresa Bowman in consultation for evaluation of joint pain    She reports a long history of pain - dating back to 2012.  She says it has steadily worsened over time.  She says she is often stiff and has muscle pain. She says that her muscles feel like they've run her muscle through a grinder.   She endorses pain in her upper and lower back, thighs and knees.  She says that she used to have pain in her hips but this has gotten better in the context of starting Humira (prescribed by dermatology for her HS).     She says when she wakes up - she feels stiff.  This is primarily in her shoulders and back of her neck.  She says she just feels tight when she tries to move.  She feels like it is sometimes hard to walk.  She says that her legs and back are stiff.  She finds that baclofen does seem to help this stiffness.      She says she thinks the Humira has helped with some of her pain but she still has the myalgias. She stopped celebrex b/c this made her legs ache.      She was started on HCQ at some point by prior rheumatologist (Teresa Bowman).  She says this seems to help with her skin as opposed to joints. She says that if she misses her HCQ she gets a rash on her cheeks.  She doesn't know why that is.     She notes she has had elevated inflammatory markers and elevated WBC.      Her HS has gotten much better with the Humira.  She is taking Humira 40 mg weekly.     She says she is not really exercise.  She says she feels like she can't do to her body feeling tight   She reports having a sleep disorder.  She says this causes issues with her sleeping. She tends to sleep better during the day than at night.   She was diagnosed with anxiety d/o about 10 years ago. She says that his is currently well controlled. No symptoms of depression.     No h/o inflammatory eye disease.     No known h/o psoriasis  She says her  brother had Reiter's arthritis.  He has had inflammation in his eyes  Mother has MS and some sort of inflammatory bowel disease.     Review of Systems:   All other systems were reviewed and were negative    Record review:  I have reviewed the patient's allergies, medications, pertinent past medical, surgical, social and family history and have updated in Epic where appropriate. I have also reviewed the pertinent records including notes, labs, and imaging tests in the [facility] medical record, referral notes and Care Everywhere.     Medical History:  Past Medical History:   Diagnosis Date   ??? Abnormal Pap smear of cervix     ?2002   ??? Acute venous embolism and thrombosis of other specified veins(453.89)     ovarian vein thrombosis   ??? Allergic rhinitis    ??? Anxiety    ??? Anxiety    ??? Asthma     mild   ??? Bilateral low back pain with left-sided sciatica 10/06/2014   ??? Chronic pain syndrome     due to lumbar disk disease   ??? Clotting disorder (CMS-HCC)    ??? Decreased libido    ??? Disorder of skin or subcutaneous tissue    ??? Fibromyalgia    ??? Insomnia    ??? Inverted nipple     left nipple inverts x several years   ??? Morbid obesity (CMS-HCC)    ??? Numbness     left side   ??? Obesity    ??? Pain in thoracic spine at multiple sites 10/06/2014   ??? Spontaneous vaginal delivery    ??? Urinary frequency    ??? Urinary incontinence    ??? Venous insufficiency    ??? Weakness     left side       Allergies:  Naltrexone, Augmentin [amoxicillin-pot clavulanate], Ceclor [cefaclor], and Penicillins    Medications:     Current Outpatient Medications:   ???  baclofen (LIORESAL) 5 mg Tab tablet, Take 1 tablet (5 mg total) by mouth at bedtime., Disp: 90 tablet, Rfl: 1  ???  citalopram (CELEXA) 20 MG tablet, Take 1 tablet (20 mg total) by mouth daily., Disp: 30 tablet, Rfl: 2  ???  clindamycin (CLEOCIN T) 1 % external solution, Apply topically daily., Disp: 60 mL, Rfl: 11  ???  clobetasoL (TEMOVATE) 0.05 % ointment, Twice daily as needed for spot treatment for up to one week on each spot, Disp: 60 g, Rfl: 5  ???  doxycycline (VIBRAMYCIN) 100 MG capsule, Twice daily for 2 weeks for flare, Disp: 30 capsule, Rfl: 4  ???  dulaglutide (TRULICITY) 0.75 mg/0.5 mL injection pen, Inject 0.5 mL (0.75 mg total) under the skin every seven (7) days., Disp: 0.5 mL, Rfl: 12  ???  DULoxetine (CYMBALTA) 60 MG capsule, Take 1 capsule (60 mg total) by mouth daily., Disp: 30 capsule, Rfl: 2  ???  empty container Misc, Use as directed to dispose of Humira Pens, Disp: 1 each, Rfl: 3  ???  HUMIRA PEN CITRATE FREE 40 MG/0.4 ML, Inject the contents of 1 pen (40 mg total) under the skin every seven (7) days., Disp: 4 each, Rfl: 5  ???  HUMIRA PEN CITRATE FREE STARTER PACK FOR CROHN'S/UC/HS 3 X 80 MG/0.8 ML, Inject the contents of 2 pens (160 mg) on day 1, THEN 1 pen (80 mg) on day 15., Disp: 3 each, Rfl: 0  ???  hydrOXYchloroQUINE (PLAQUENIL) 200 mg tablet, Take 1 tablet (200 mg total) by mouth  Two (2) times a day., Disp: 60 tablet, Rfl: 3  ???  melatonin 5 mg cap, Take by mouth., Disp: , Rfl:   ???  metFORMIN (GLUCOPHAGE-XR) 500 MG 24 hr tablet, Take 1 tablet (500 mg total) by mouth Two (2) times a day., Disp: 120 tablet, Rfl: 0  ???  montelukast (SINGULAIR) 10 mg tablet, Take 1 tablet (10 mg total) by mouth in the morning., Disp: 30 tablet, Rfl: 11  ???  multivitamin (THERAGRAN) per tablet, Take 1 tablet by mouth daily., Disp: , Rfl:   ???  spironolactone (ALDACTONE) 100 MG tablet, Take 1 tablet (100 mg total) by mouth daily., Disp: 90 tablet, Rfl: 3  ???  triamcinolone (KENALOG) 0.1 % cream, Twice daily for up to 2 weeks for healing areas, Disp: 80 g, Rfl: 2  ???  spironolactone (ALDACTONE) 50 MG tablet, ALTERNATE TAKING 1 AND 1/2 TABLETS (75 MG TOTAL) AND 2 TABLETS (100 MG TOTAL) BY MOUTH DAILY (Patient not taking: Reported on 10/12/2021), Disp: 90 tablet, Rfl: 1  ???  topiramate (TOPAMAX) 25 MG tablet, Take 1 tablet (25 mg total) by mouth Two (2) times a day. (Patient not taking: Reported on 10/12/2021), Disp: 60 tablet, Rfl: 1    Surgical History:  Past Surgical History:   Procedure Laterality Date   ??? CHOLECYSTECTOMY     ??? CRYOTHERAPY      cervix   ??? PR EXCISION TURBINATE,SUBMUCOUS Bilateral 09/16/2017    Procedure: BILATERAL MAXILLARY ANTROSTOMIES;  Surgeon: Meryle Ready, Bowman;  [facility];  Service: ENT   ??? Resection of hidaradentits     ??? TONSILLECTOMY         Social History:  Social History     Tobacco Use   ??? Smoking status: Former     Types: Cigarettes   ??? Smokeless tobacco: Never   Vaping Use   ??? Vaping Use: Former   Substance Use Topics   ??? Alcohol use: Yes     Alcohol/week: 2.0 standard drinks     Types: 2 Shots of liquor per week     Comment: occasionally   ??? Drug use: No       Family History:  Family History   Problem Relation Age of Onset   ??? Diabetes Father    ??? Arthritis Father    ??? Breast cancer Maternal Grandmother    ??? Cancer Maternal Grandmother    ??? Breast cancer Paternal Grandmother    ??? Cancer Paternal Grandmother    ??? Multiple sclerosis Mother    ??? Hip fracture Mother    ??? Arthritis Brother         Reiter's   ??? Squamous cell carcinoma Neg Hx    ??? Basal cell carcinoma Neg Hx    ??? Melanoma Neg Hx Objective   Vitals:    10/12/21 1310   BP: 108/84   Pulse: 98   Temp: 36.2 ??C (97.2 ??F)   TempSrc: Temporal   Weight: (!) 173.1 kg (381 lb 9.6 oz)   Height: 167.6 cm (5' 6)       Physical Exam  General:   Patient is well appearing, and does not appear to be in any acute distress   Eyes:   EOMI, and sclera anicteric. Adequate tear film   ENT:   MMM. Normal salivary pool Oropharhynx without any erythema or exudate. No oropharyngeal exudates or ulcerations    Neck:   Supple, no cervical lymphadenopathy   Cardiovascular:  normal rate, regular rhythm.  S1 and S2 normal, without any murmur, rub, or gallop.   Pulmonary:  Clear to auscultation bilaterally, without                           Wheezes/crackles/rhonchi.  Good air movement. Normal work of breathing.   Skin:    No rash/lesions/breakdown. Normal turgor and elasticity.    No Raynaud???s phenomenon or digital ulcers.  No purpura, petechiae, telangiectasia or livedo reticularis.  No malar rash, discoid lesions, bullae, scars or alopecia.  No Gottron???s papules or heliotrope.  No sclerodactyly, skin thickening, calcinosis, tendon friction rubs.  No subcutaneous nodules or tophi. No psoriatic plaques or nail pitting.     Psychiatry:   Alert and oriented to person, place, and time. Mood and affect appropriate and congruent.    GI:   Normoactive bowel sounds, abdomen soft, non-tender, obese   Extremities:   Warm and well perfused. No bilateral cyanosis, clubbing or edema.   Musculo Skeletal:   Diffusely tender to palpation of scattered MCP, PIP and DIP joints but no swelling or stiffness, no swelling or stiffness of b/l wrists or elbows.  B/l knees with crepitus.   Tender to palpation diffusely throughout body    Neurological:  Alert and oriented to person, place and time.  Cranial nerves II-XII grossly intact.  Strength grossly intact       Test Results    Lab Results   Component Value Date    WBC 16.8 (H) 03/18/2020    HGB 13.1 03/18/2020    HCT 39.6 03/18/2020 PLT 296 03/18/2020       Lab Results   Component Value Date    NA 136 01/29/2021    K 4.1 01/29/2021    CL 101 02/05/2020    CO2 30.0 02/05/2020    BUN 14 02/05/2020    CREATININE 0.59 (L) 01/29/2021    GLU 112 02/05/2020    CALCIUM 9.7 02/05/2020       Lab Results   Component Value Date    BILITOT 0.6 02/05/2020    PROT 7.6 02/05/2020    ALBUMIN 4.1 02/05/2020    ALT 12 02/05/2020    AST 19 02/05/2020    ALKPHOS 114 02/05/2020       Lab Results   Component Value Date    INR 2.3 12/27/2010       Office Visit on 09/22/2021   Component Date Value   ??? HCG Urine, POC 09/22/2021 Negative       Latest Reference Range & Units Most Recent   Quantiferon TB Gold Plus Interpretation Negative  Negative  12/22/20 16:42   Quantiferon TB NIL value IU/mL 0.02  12/22/20 16:42   Quantiferon Antigen 1 minus Nil IU/mL 0.00  12/22/20 16:42   Quantiferon Antigen 2 minus NIL IU/mL 0.01  12/22/20 16:42   Quantiferon Mitogen IU/mL 9.98  12/22/20 16:42        Latest Reference Range & Units Most Recent   Antinuclear Antibodies (ANA) Negative  Negative  01/08/20 11:10   Rheumatoid Factor 0.0 - 15.0 IU/mL <8.6  02/05/20 15:25      Latest Reference Range & Units Most Recent   Hep B Surface Ag Nonreactive  Nonreactive  03/11/20 10:53   Hep B S Ab Nonreactive, Grayzone  Nonreactive  12/22/20 16:42   Hep B Surf Ab Quant <8.00 m(IU)/mL <8.00  12/22/20 16:42   Hep B Core Total Ab Nonreactive  Nonreactive  12/22/20 16:42  Hepatitis C Ab Nonreactive  Nonreactive  03/11/20 10:53   HIV Antigen/Antibody Combo Nonreactive  Nonreactive  03/11/20 10:53

## 2021-10-12 NOTE — Unmapped (Signed)
You were seen by [facility] Rheumatology today.      [facility] at [facility]  [address]rd [address]   [place], [place] [address]  Phone:  [phone]  Fax:  [phone]     Rheumatologist:  Dr. Ilsa Iha       Summary of Plan for 10/12/21    Diagnostic testing recommendations:  Labs today (in clinic or on the first floor of the [facility] [employer])  X-rays today (on the first floor of the [facility])    Therapeutic / Treatment recommendations:   We can add NSAID to your regimen.  I will defer the management of your Humira to the dermatology group.     Referrals and follow-up recommendations:  Please follow-up with PCP, dermatology and pain management     When should I expect results?   Please note that it may take up to 21 days for results to return.  If you have not been notified by phone, mail or [facility] (if applicable) in 21 days, please contact our office at [phone] or via [facility] messaging [email or web address])     What do I do if I have questions or concerns after the visit?   If you have non-urgent questions, the best way to contact your provider is to send a MyChart message by visiting [email or web address].  You can also use MyChart to request refills and access test results.  Providers will do their best to respond within 2-3 business days, although it may take longer in some circumstances.  If you have immediate concerns, please contact our clinic by phone  at [phone].     Thank you for allowing [facility] Rheumatology to be involved in your care!

## 2021-10-14 NOTE — Unmapped (Signed)
Provider has responded to med refill.

## 2021-10-18 DIAGNOSIS — M1990 Unspecified osteoarthritis, unspecified site: Principal | ICD-10-CM

## 2021-10-20 ENCOUNTER — Ambulatory Visit
Admit: 2021-10-20 | Discharge: 2021-10-21 | Payer: PRIVATE HEALTH INSURANCE | Attending: Student in an Organized Health Care Education/Training Program | Primary: Student in an Organized Health Care Education/Training Program

## 2021-10-20 DIAGNOSIS — F32A Depression, unspecified depression type: Principal | ICD-10-CM

## 2021-10-20 DIAGNOSIS — Z79899 Other long term (current) drug therapy: Principal | ICD-10-CM

## 2021-10-20 DIAGNOSIS — G4700 Insomnia, unspecified: Principal | ICD-10-CM

## 2021-10-20 LAB — TSH: THYROID STIMULATING HORMONE: 1.258 u[IU]/mL (ref 0.550–4.780)

## 2021-10-20 MED ORDER — TOPIRAMATE 25 MG TABLET
ORAL_TABLET | Freq: Two times a day (BID) | ORAL | 1 refills | 30 days | Status: CP
Start: 2021-10-20 — End: 2021-12-19

## 2021-10-20 MED ORDER — MELATONIN 3 MG TABLET
ORAL_TABLET | Freq: Every evening | ORAL | 11 refills | 30 days | Status: CP
Start: 2021-10-20 — End: ?

## 2021-10-20 MED ORDER — RYBELSUS 3 MG TABLET
ORAL_TABLET | Freq: Every day | ORAL | 3 refills | 0 days | Status: CP
Start: 2021-10-20 — End: ?

## 2021-10-20 NOTE — Unmapped (Addendum)
For Sleep:  - try 3 mg of melatonin  - call Neurology to see if you can schedule a sooner appointment  - we will try to order a home sleep study    For weight loss:  - start topiramate 25 mg twice a day (ok to start once a day and work your way up to twice a day)  - referral sent to weight management clinic  - I will try to get you approved for Rebylsus (pill form of Ozempic)     -stop celexa today, we will consider adding a medication called Wellbutrin at next visit

## 2021-10-20 NOTE — Unmapped (Signed)
Internal Medicine Clinic Visit    Reason for visit:     A/P:           1. Morbid obesity (CMS-HCC)   30 lbs weight gain over last year with difficulty standing, working. Had injection site reactions with Victoza, Trulicity. Did not tolerate Ozempic injections either. On maximum tolerable dose of metformin. Topiramate also lead to side effects in the past but she is willing to try again. Has also had some effect with phentermine but caused some jitteriness. Also has pre-diabetes. Has not been amenable to considering bariatric surgery in the past, but we discussed today that this might be an option to consider.   - referal to [facility]  - re-trial topiramate, consider adding restarting phentermine after that  - will attempt to prescribe rebylsus due to multiple injection site reactions and treatment failure with other agents  - re-check HbA1C today     2. Depression, unspecified depression type   Is on SNRI for fibromyalgia so will stop Celexa, may have also contributed to weight gain. Will consider Wellbutrin but she is hesitant because her son had serotonin syndrome. Holding off on other changes for now since starting topiramate as above.   - stop celexa  - consider wellbutrin at next visit  - PHQ9 at next visit     3. Insomnia, unspecified type   Has been seen by Neurology Sleep Clinic in the past. Wanted to rule out OSA before other treatment for Sleep Wake Cycle Disturbance. On very low dose melatonin only right now.   - she will try to schedule follow-up with sleep clinic  - increase melatonin to 3 mg nightly  - will attempt to order home sleep study, she cannot go to sleep center because she has to watch her son  - could consider low dose trazodone if melatonin ineffective     4. Long-term use of hydroxychloroquine   - ophthalmology referral sent     HCM:  -up to date    Return in about 2 months (around 12/20/2021).    Staffed with Dr. Tinnie Gens, discussed    __________________________________________________________    HPI:    Came in today to discuss sleep. Her sleep schedule is off. Was seeing [facility] neurology sleep clinic. She was supposed to have a sleep study but she can't do it because she doesn't have anyone to watch her child. She will lay in bed for hours. She has been taking 0.5mg  melatonin.    She has also been having issues with Trulicty. She has been getting injection site reactions and has had to stop. She has gained 30 lbs over the last 2 years so she feels like what she's doing currently is not working. She has not seen the [facility] in a while. The weight has been a problem and she feels like she can barely stand now.     Mood slightly improved on celexa but is having weight gain.     __________________________________________________________        Medications:  Reviewed in EPIC  __________________________________________________________    Physical Exam:   Vital Signs:  Vitals:    10/20/21 1350   BP: 111/73   BP Site: L Wrist   BP Position: Sitting   BP Cuff Size: Medium   Pulse: 92   Temp: 35.7 ??C (96.2 ??F)   SpO2: 99%   Weight: (!) 172.7 kg (380 lb 12.8 oz)  PTHomeBP        Gen: Well appearing, NAD  Pulm: normal work of breathing  Abd: Soft, NTND  Ext: No edema      Medication adherence and barriers to the treatment plan have been addressed. Opportunities to optimize healthy behaviors have been discussed. Patient / caregiver voiced understanding.

## 2021-10-20 NOTE — Unmapped (Signed)
[facility] at [facility]     Type of visit:  face to face    Reason for visit: f/u    Questions / Concerns that need to be addressed: Trulicity   sleep issues ( sleep schedule and insomnia)    General Consent to Treat (GCT) for non-epic video visits only: Verbal consent    Diabetes:  ??? Regularly checking blood sugars?: no  o If yes, when? Complete log for past 7 days  Date Before Breakfast After Breakfast Before Lunch After Lunch Before Dinner After Dinner Before Bed                                                                                                                                   Hypertension:  ??? Have blood pressure cuff at home?: yes- wrist cuff  ??? Regularly checking blood pressure?: no  If yes, complete log for past 7 days  Date Time BP Pulse                                                                           Screening BP- 111/73    [employer] BPs (complete if screening BP has a systolic  > 130 or diastolic > 80)        Allergies reviewed: Yes    Medication reviewed: Yes  Pended refills? Yes    HCDM reviewed and updated in Epic:    We are working to make sure all of our patients??? wishes are updated in Epic and part of that is documenting a Environmental health practitioner for each patient  A Health Care Decision Maker is someone you choose who can make health care decisions for you if you are not able - who would you most want to do this for you????  is already up to date.    HCDM (patient stated preference): Milus Banister - Mother - [phone]      COVID-19 Vaccine Summary  Which COVID-19 Vaccine was administered  Pfizer  Type:  Dates Given:       Immunization History   Administered Date(s) Administered   ??? COVID-19 VACC,MRNA,(PFIZER)(PF) 01/29/2020, 02/19/2020, 08/19/2020   ??? Covid-19 Vacc, Unspecified 01/29/2020, 02/19/2020, 08/19/2020   ??? Influenza Vaccine Quad (IIV4 PF) 72mo+ injectable 08/15/2018, 09/22/2021   ??? Influenza Vaccine Quad (IIV4 W/PRESERV) 66MO+ 08/19/2020   ??? Influenza Virus Vaccine, unspecified formulation 08/21/2017, 08/08/2019   ??? PNEUMOCOCCAL POLYSACCHARIDE 23 11/19/2017

## 2021-10-21 NOTE — Unmapped (Signed)
Prior authorization sent to plan on 10/21/2021 with/ from [facility] via [employer].    Medication: Rybelsus 3MG  tablets      Reference ID: Key:??[id number]       Status: waiting for plan determination; determination expected within 72 hours via CoverMyMeds website.    Pharmacy Notified: not at this time.    Patient Notified: not at this time.    Provider Notified: not at this time.    Clinical Information: Milinda Pointer, CMA.

## 2021-10-22 NOTE — Unmapped (Signed)
The criteria we used to make this decision is the drug manufacturer package insert for [employer] Tablet. Here are the policy requirements your request did not meet: This drug is not approved by the Food and Drug Administration (FDA) to treat Morbid (severe) obesity due to excess calories. It is only FDA approved for the treatment of Type 2 diabetes mellitus in patients 39 years of age and older.

## 2021-10-22 NOTE — Unmapped (Signed)
Addended by: Henry Russel A on: 10/21/2021 08:56 PM     Modules accepted: Level of Service

## 2021-10-22 NOTE — Unmapped (Signed)
[facility] Refill Coordination Note    Specialty Medication(s) to be Shipped:   Inflammatory Disorders: Humira    Other medication(s) to be shipped: No additional medications requested for fill at this time     Teresa Bowman, DOB: [birth year]  Phone: [phone] (home)       All above HIPAA information was verified with patient.     Was a Nurse, learning disability used for this call? No    Completed refill call assessment today to schedule patient's medication shipment from the [facility] [phone]).  All relevant notes have been reviewed.     Specialty medication(s) and dose(s) confirmed: Regimen is correct and unchanged.   Changes to medications: [name] reports no changes at this time.  Changes to insurance: No  New side effects reported not previously addressed with a pharmacist or physician: None reported  Questions for the pharmacist: No    Confirmed patient received a Conservation officer, historic buildings and a Surveyor, mining with first shipment. The patient will receive a drug information handout for each medication shipped and additional FDA Medication Guides as required.       DISEASE/MEDICATION-SPECIFIC INFORMATION        For patients on injectable medications: Patient currently has 2 doses left.  Next injection is scheduled for 10/26/2021.    SPECIALTY MEDICATION ADHERENCE     Medication Adherence    Patient reported X missed doses in the last month: 0  Specialty Medication: Humira CF 40 mg/0.4 ml  Patient is on additional specialty medications: No  Any gaps in refill history greater than 2 weeks in the last 3 months: no  Demonstrates understanding of importance of adherence: yes  Informant: patient  Reliability of informant: reliable  Confirmed plan for next specialty medication refill: delivery by pharmacy  Refills needed for supportive medications: not needed              Were doses missed due to medication being on hold? No    Humira 40/0.4 mg/ml: 14 days of medicine on hand REFERRAL TO PHARMACIST     Referral to the pharmacist: Not needed      [facility]     Shipping address confirmed in Epic.     Delivery Scheduled: Yes, Expected medication delivery date: 10/28/2021.     Medication will be delivered via UPS to the prescription address in Epic WAM.    Tashawna Thom D [name]   [facility] Specialty Technician

## 2021-10-27 ENCOUNTER — Ambulatory Visit: Admit: 2021-10-27 | Discharge: 2021-10-28 | Payer: PRIVATE HEALTH INSURANCE

## 2021-10-27 DIAGNOSIS — R928 Other abnormal and inconclusive findings on diagnostic imaging of breast: Principal | ICD-10-CM

## 2021-10-27 DIAGNOSIS — M1990 Unspecified osteoarthritis, unspecified site: Principal | ICD-10-CM

## 2021-10-27 MED ORDER — MELOXICAM 15 MG TABLET
ORAL_TABLET | Freq: Every day | ORAL | 2 refills | 30 days | Status: CP
Start: 2021-10-27 — End: 2022-10-27

## 2021-10-27 MED FILL — HUMIRA PEN CITRATE FREE 40 MG/0.4 ML: SUBCUTANEOUS | 28 days supply | Qty: 4 | Fill #3

## 2021-10-27 NOTE — Unmapped (Signed)
Patient Name: Teresa Bowman  Patient Age: 40 y.o.  Encounter Date: 10/27/2021    Referring Physician:   Talbert Cage, DO  [address]  [id number]  [place],  [place] [address]    Primary Care Provider:  Kathrin Ruddy, MD    Supervising Physician  Dr. Debbrah Alar      Reason for Visit:   Chief Complaint   Patient presents with   ??? New Diagnosis       HPI:    Teresa Bowman is a 40 y.o. female who is seen in consultation at the request of [name]* for abnormal mammogram.  The patient's medical record has been reviewed and the patient is interviewed.  She had recent imaging for which she had call back and diagnostic work-up on the left breast.  Screen detected focal asymmetry persisted in the upper central left breast measuring 7 mm.  It did not have an ultrasound correlate and stereotactic core biopsy is recommended.  The patient has not had any changes on her own breast exam.  She has no first-degree relatives with breast cancer.  There is question of maternal and paternal grandmother with postmenopausal breast cancers    Past medical history notable for morbid obesity, chronic pain syndrome and depression    Review of Systems:  Is unremarkable for constitutional complaint all 10 system    Reproductive History:  The patient is premenopausal she is a G2 P2 first pregnancy at age 69    Medical History:  Past Medical History:   Diagnosis Date   ??? Abnormal Pap smear of cervix     ?2002   ??? Acute venous embolism and thrombosis of other specified veins(453.89)     ovarian vein thrombosis   ??? Allergic rhinitis    ??? Anxiety    ??? Anxiety    ??? Asthma     mild   ??? Bilateral low back pain with left-sided sciatica 10/06/2014   ??? Chronic pain syndrome     due to lumbar disk disease   ??? Clotting disorder (CMS-HCC)    ??? Decreased libido    ??? Disorder of skin or subcutaneous tissue    ??? Fibromyalgia    ??? Insomnia    ??? Inverted nipple     left nipple inverts x several years   ??? Morbid obesity (CMS-HCC)    ??? Numbness     left side   ??? Obesity    ??? Pain in thoracic spine at multiple sites 10/06/2014   ??? Spontaneous vaginal delivery    ??? Urinary frequency    ??? Urinary incontinence    ??? Venous insufficiency    ??? Weakness     left side       Surgical History:  Past Surgical History:   Procedure Laterality Date   ??? CHOLECYSTECTOMY     ??? CRYOTHERAPY      cervix   ??? PR EXCISION TURBINATE,SUBMUCOUS Bilateral 09/16/2017    Procedure: BILATERAL MAXILLARY ANTROSTOMIES;  Surgeon: Meryle Ready, MD;  Location: [place];  Service: ENT   ??? Resection of hidaradentits     ??? TONSILLECTOMY         Family History:  Family History   Problem Relation Age of Onset   ??? Diabetes Father    ??? Arthritis Father    ??? Breast cancer Maternal Grandmother    ??? Cancer Maternal Grandmother    ??? Breast cancer Paternal Grandmother    ???  Cancer Paternal Grandmother    ??? Multiple sclerosis Mother    ??? Hip fracture Mother    ??? Arthritis Brother         Reiter's   ??? Squamous cell carcinoma Neg Hx    ??? Basal cell carcinoma Neg Hx    ??? Melanoma Neg Hx        Social History:  Tobacco use:   Social History     Tobacco Use   Smoking Status Former   ??? Types: Cigarettes   Smokeless Tobacco Never     Alcohol use:   Social History     Substance and Sexual Activity   Alcohol Use Yes   ??? Alcohol/week: 2.0 standard drinks   ??? Types: 2 Shots of liquor per week    Comment: occasionally     Drug use:   Social History     Substance and Sexual Activity   Drug Use No     Living situation: Patient is divorced lives at home with her 2 children she is employed as a delivery.  She is a current every day smoker denies any alcohol intake    Medications:     Current Outpatient Medications:   ???  baclofen (LIORESAL) 5 mg Tab tablet, Take 1 tablet (5 mg total) by mouth at bedtime., Disp: 90 tablet, Rfl: 1  ???  clindamycin (CLEOCIN T) 1 % external solution, Apply topically daily., Disp: 60 mL, Rfl: 11  ???  clobetasoL (TEMOVATE) 0.05 % ointment, Twice daily as needed for spot treatment for up to one week on each spot, Disp: 60 g, Rfl: 5  ???  doxycycline (VIBRAMYCIN) 100 MG capsule, Twice daily for 2 weeks for flare, Disp: 30 capsule, Rfl: 4  ???  DULoxetine (CYMBALTA) 60 MG capsule, Take 1 capsule (60 mg total) by mouth daily., Disp: 30 capsule, Rfl: 2  ???  empty container Misc, Use as directed to dispose of Humira Pens, Disp: 1 each, Rfl: 3  ???  HUMIRA PEN CITRATE FREE 40 MG/0.4 ML, Inject the contents of 1 pen (40 mg total) under the skin every seven (7) days., Disp: 4 each, Rfl: 5  ???  hydrOXYchloroQUINE (PLAQUENIL) 200 mg tablet, Take 1 tablet (200 mg total) by mouth Two (2) times a day., Disp: 60 tablet, Rfl: 3  ???  indomethacin (INDOCIN SR) 75 mg CR capsule, Take 1 capsule (75 mg total) by mouth daily. Do not crush or chew. Swallow capsule whole. Take with food, Disp: 30 capsule, Rfl: 4  ???  melatonin 3 mg Tab, Take 1 tablet (3 mg total) by mouth every evening., Disp: 30 tablet, Rfl: 11  ???  metFORMIN (GLUCOPHAGE-XR) 500 MG 24 hr tablet, Take 1 tablet (500 mg total) by mouth Two (2) times a day., Disp: 120 tablet, Rfl: 0  ???  montelukast (SINGULAIR) 10 mg tablet, Take 1 tablet (10 mg total) by mouth in the morning., Disp: 30 tablet, Rfl: 11  ???  multivitamin (THERAGRAN) per tablet, Take 1 tablet by mouth daily., Disp: , Rfl:   ???  semaglutide (RYBELSUS) 3 mg Tab, Take 3 mg by mouth daily., Disp: 30 tablet, Rfl: 3  ???  spironolactone (ALDACTONE) 100 MG tablet, Take 1 tablet (100 mg total) by mouth daily., Disp: 90 tablet, Rfl: 3  ???  topiramate (TOPAMAX) 25 MG tablet, Take 1 tablet (25 mg total) by mouth Two (2) times a day., Disp: 60 tablet, Rfl: 1  ???  triamcinolone (KENALOG) 0.1 % cream, Twice daily for up  to 2 weeks for healing areas, Disp: 80 g, Rfl: 2    Allergies:  is allergic to naltrexone, augmentin [amoxicillin-pot clavulanate], ceclor [cefaclor], and penicillins.          Exam:  BSA: 2.85 meters squared  BP 112/71  - Pulse 103  - Temp 36.4 ??C (97.6 ??F) (Temporal)  - Resp 16  - Ht 167.6 cm (5' 5.98)  - Wt (!) 175 kg (385 lb 11.2 oz)  - SpO2 98%  - BMI 62.28 kg/m??   Pain Assessment: Pain scale 0    General Appearance:  No acute distress, well appearing and well nourished.   Head:  Normocephalic, atraumatic.   Eyes:  Conjuctiva and lids appear normal. Pupils equal and round,   sclera anicteric.   Ears:  Overall appearance normal with no scars, lesions or               masses.  Hearing is grossly normal.   Nose: Not examined secondary to face mask for covid   Throat: Not examined secondary to face mask for covid   Breast:  Breast exam has no skin change mass or nipple discharge bilaterally   Axilla  no adenopathy in the axilla bilateral   Neck:  Neck is supple trachea midline no JVD is supraclavicular adenopathy   Pulmonary:    Normal respiratory effort   Cardiovascular:  Regular rate and rhythm per vital signs   Musculoskeletal: Normal gait.  Extremities without clubbing, cyanosis, or           edema.   Psychiatric: Judgement and insight appropriate.  Oriented to person,         place,  and time.       Diagnostic Studies:  @MAMMOFINDINGS @  Per HPI    Assessment:  Normal mammogram    Plan:  The patient is assessed to be at average risk for the development of breast cancer.  Clinical exam is normal.  She will proceed with a stereotactic core biopsy today she will be called with results and additional recommendations to follow      The note was transcribed by dragon and may contain errors in spelling

## 2021-10-29 NOTE — Unmapped (Signed)
-----   Message from Ermelinda Das, MD sent at 10/27/2021  4:39 PM EST -----  Regarding: Change in NSAID prescription  Please call patient and let her know that her insurance denied the Indomethacin and required [place] to try alternate NSAID.   I wrote for meloxicam 15 mg daily to appease her insurance. Please let patient know.     Thanks,   Marchelle Folks

## 2021-10-29 NOTE — Unmapped (Signed)
Message left on patients private voicemail regarding change.    Teresa Bowman

## 2021-11-04 NOTE — Unmapped (Signed)
I saw the patient last in consultation with mammographic abnormality on the left.  She had a core biopsy and I spoke with her on the telephone today and reviewed this with her revealing flat epithelial atypia with microcalcifications also there was usual ductal hyperplasia, stromal fibrosis with fibroadenomatoid change and apical and cyst.  I explained to her that flat epithelial atypia does not need to be surgically removed and I recommended a 14-month follow-up mammogram on the left for stability.  And I will see her on an as-needed basis for any new problems

## 2021-11-05 MED ORDER — PHENTERMINE 15 MG CAPSULE
ORAL_CAPSULE | Freq: Every morning | ORAL | 2 refills | 30.00000 days | Status: CP
Start: 2021-11-05 — End: ?
  Filled 2021-11-09: qty 30, 30d supply, fill #0

## 2021-11-08 NOTE — Unmapped (Unsigned)
[facility]  [facility]  [address]  [place], [address]  Phone: [phone]  Fax: [phone]        INTERNAL MEDICINE CLINIC COUNSELING ASSESSMENT        OVERVIEW:  Teresa Bowman MRN: [id number]  PRESENTING FOR EVALUATION   At the request of Kathrin Ruddy, MD.      Type of Service: Biopsychosocial and Comprehensive Clinical Assessment  PROCEDURE CODE:  [phone]    Purpose: The purpose of the session is to complete a diagnostic clinical assessment and initiate services, if appropriate, or refer for services as indicated    Presenting Concerns: Patient is a 40 y.o., [place] or African American  White or Caucasian race, Not Hispanic or Latino ethnicity,  ENGLISH speaking female  with a history of   Past Medical History:   Diagnosis Date   ??? Abnormal Pap smear of cervix     ?2002   ??? Acute venous embolism and thrombosis of other specified veins(453.89)     ovarian vein thrombosis   ??? Allergic rhinitis    ??? Anxiety    ??? Anxiety    ??? Asthma     mild   ??? Bilateral low back pain with left-sided sciatica 10/06/2014   ??? Chronic pain syndrome     due to lumbar disk disease   ??? Clotting disorder (CMS-HCC)    ??? Decreased libido    ??? Disorder of skin or subcutaneous tissue    ??? Fibromyalgia    ??? Insomnia    ??? Inverted nipple     left nipple inverts x several years   ??? Morbid obesity (CMS-HCC)    ??? Numbness     left side   ??? Obesity    ??? Pain in thoracic spine at multiple sites 10/06/2014   ??? Spontaneous vaginal delivery    ??? Urinary frequency    ??? Urinary incontinence    ??? Venous insufficiency    ??? Weakness     left side         {    Coding tips - Do not edit this text, it will delete upon signing of note!    ?? Telephone visits [phone] for Physicians and APP??s and [phone] for Non- Physician Clinicians)- Only use minutes on the phone to determine level of service.    ?? Video visits [phone]) - Use both minutes on video and pre/post minutes to determine level of service.       :75688}    The patient reports they are currently: {patient location:81390}. I spent *** minutes on the {phone audio video visit:67489} with the patient on the date of service. I spent an additional *** minutes on pre- and post-visit activities on the date of service.     The patient was physically located in [place] or a state in which I am permitted to provide care. The patient and/or parent/guardian understood that s/he may incur co-pays and cost sharing, and agreed to the telemedicine visit. The visit was reasonable and appropriate under the circumstances given the patient's presentation at the time.    The patient and/or parent/guardian has been advised of the potential risks and limitations of this mode of treatment (including, but not limited to, the absence of in-person examination) and has agreed to be treated using telemedicine. The patient's/patient's family's questions regarding telemedicine have been answered.     If the visit was completed in an ambulatory setting, the patient and/or parent/guardian has  also been advised to contact their provider???s office for worsening conditions, and seek emergency medical treatment and/or call 911 if the patient deems either necessary.          =======================================================================================    ASSESSMENT AND PLAN    Diagnoses:   No diagnosis found.     Cognitive/Personality:      deferred    Stressors:    ***    Presentation sufficient for:  ***      Short term goal focused counseling for identified treatment goals:   []  Reduce PHQ / GAD by 5 points  []  Get or keep PHQ / GAD under 10 points  []  Stress and self-care management  []  Referral for outside treatment  []  Bridge to outside treatment  []  Other:  ***       Patient-stated health goal:   ***      Next visit to provide introduction to problem solving treatment and mind body stress reduction skill training for:  ***      Goals until next visit:  ***      Consider benefit of ***  Recommend ***      Referrals/Resources provided at assessment: ***    I provided an intervention for the {SDOH ZOXWRU:04540} SDOH domain. The intervention was {SDOH INTERVENTION:69735}       SUMMARY INFORMATION:    Clinician provided psychoeducation about diagnosis/es.  Discussed diagnosis/es with the patient and role of short-term treatment.     ***  We discussed importance of healthy sleep hygiene   We discussed importance of balanced nutrition for mood and functioning   Discussed reducing caffeine intake and having earlier in the day  We discussed benefit of current physical activity for mood  Discussed use of substances  Discussed ways and means to enhance existing behavioral activation and supports and establish new means of support.     Medication adherence and barriers to the treatment plan have been addressed. Opportunities to optimize healthy behaviors have been discussed. Patient / caregiver voiced understanding.      Clinician provided information about and discussed Problem Solving Treatment and Mind Body Stress Reduction Skill Training as additional tools to help with mood and functioning. The patient expressed interest in problem solving treatment and mind body stress reduction skill training.    Patient understands diagnosis and treatment plan. Patient voices understanding. Plan is consistent with the patient???s preferences and goals.      SAFETY ASSESSMENT:   A suicide and violence risk assessment was performed as part of this evaluation. There patient is deemed to be at chronic elevated risk for self-harm/suicide given the following factors: {PSY Suicide Risk Factors:22910}. The patient is deemed to be at chronic elevated risk for violence given the following factors: {PSY Violence Risk Factors:22914}. These risk factors are mitigated by the following factors:{PSY Mitigating Factors:22917}. There is no acute risk for suicide or violence at this time. The patient was educated about relevant modifiable risk factors including following recommendations for treatment of psychiatric illness and abstaining from substance abuse.   While future psychiatric events cannot be accurately predicted, the patient does not currently require acute inpatient psychiatric care and does not currently meet [facility] involuntary commitment criteria.      []   Information indicated and provided about suicidal ideation as a symptom of depression and safety planning discussed - recommended patient to call 911 or go to the nearest hospital immediately if concern for, or for active, self-harm.      ============================================================================================================================================  COUNSELING ASSESSMENT      PHQ-9 SCORE AT ASSESSMENT:    of 27 {select_status_or_delete_smartlist:64641} Purpose of screener discussed and score explained.    PHQ-9 PHQ-9 TOTAL SCORE   06/23/2021 14        GAD-7 SCORE AT ASSESSMENT:      {NUMBERS 1-31:20750} of 21.    Purpose of screener discussed and score explained.                   SUICIDALITY: {Yes/No:22953}   PRIOR SUICIDE ATTEMPTS:  {Yes/No:22953}   SELF-HARM BEHAVIORS:  {Yes/No:22953}   HOMICIDALITY:  {Yes/No:22953}   HISTORY OF VIOLENCE: {Yes/No:22953}       PHYSICAL PAIN:    If you have any, how would you rate your physical pain right now? {pain scale, das:38454}  Do you have any history of chronic pain? {yes/no:310494}  What do use, or do, to manage the pain:  ***        CURRENT STRESSORS:      ***                  SOCIAL DOCUMENTATION  Date information was obtained:  ***    Born in ***  Raised with both parents ***  Parents living- {yes/no:310494}   If deceased - which parent, age at, and cause of, death:   Siblings - ***    Relocated to [place]: {yes/no:310494}   If relocated, came to [place]: year: ***  Reason:  ***    Residential:  Who lives in the home currently?: ***  Housing: {Housing :[id number]}  Risk of losing current housing:  {yes/no:310494}  Homelessness: {yes/no:310494}   Eviction: {yes/no:310494}     Food Insecurity: {yes/no:310494}    Transportation: {mode of transportation:27870}    Marital/partner status:  In an intimate relationship: {yes/no:310494}  Describes relationship as ***  Sexually active: {yes/no:310494}     Related arguments:  {yes/no:310494}  Uses safe sex: {yes/no:310494}    Children: (number and ages, living with) - ***  Grandchildren - ***    Education  Through what year did you complete in school: {education status:51260}  History of Learning Disability or Learning Difficulty: {yes/no:310494}  Interested in or planning further education? 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 Are they helpful?: ***  Cultural Considerations: ***    Child related:  History of Department of Social Services (DSS)/Child Management consultant (CPS): {yes/no:310494} ***  History of Mental Illness(MI)/Substance Abuse (SA): {yes/no:310494} ***  Was family involved in assessment? {yes,no,n/a:36087}       Legal History: {JRTSTRESSORS-legal:27146}    ========================================================      INTERIM TREATMENT HISTORY:     History of:  Counseling:  {yes/no:21137}  Psychiatry treatment - if yes prior diagnosis(es) (dx):  {yes/no:21137}   Psychiatric hospitalizations:   {yes/no:21137}    PAST MEDICAL HISTORY: Past Medical History:   Diagnosis Date   ???  Abnormal Pap smear of cervix     ?2002   ??? Acute venous embolism and thrombosis of other specified veins(453.89)     ovarian vein thrombosis   ??? Allergic rhinitis    ??? Anxiety    ??? Anxiety    ??? Asthma     mild   ??? Bilateral low back pain with left-sided sciatica 10/06/2014   ??? Chronic pain syndrome     due to lumbar disk disease   ??? Clotting disorder (CMS-HCC)    ??? Decreased libido    ??? Disorder of skin or subcutaneous tissue    ??? Fibromyalgia    ??? Insomnia    ??? Inverted nipple     left nipple inverts x several years   ??? Morbid obesity (CMS-HCC)    ??? Numbness     left side   ??? Obesity    ??? Pain in thoracic spine at multiple sites 10/06/2014   ??? Spontaneous vaginal delivery    ??? Urinary frequency    ??? Urinary incontinence    ??? Venous insufficiency    ??? Weakness     left side         CURRENT MEDICAL PROBLEMS:    Patient Active Problem List   Diagnosis   ??? 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 Pain in thoracic spine at multiple sites   ??? Allergic rhinitis   ??? Morbid obesity (CMS-HCC)   ??? Anxiety   ??? Chronic pain syndrome   ??? Mild intermittent asthma without complication   ??? Vitamin D deficiency   ??? Mixed incontinence   ??? Anxiety, generalized   ??? Depression   ??? Insomnia   ??? Long-term use of hydroxychloroquine         CURRENT MEDICATIONS:    Taking all medications regularly?  {yes/no:310494}   Any missed doses? {yes/no:310494}     Current Outpatient Medications on File Prior to Visit   Medication Sig Dispense Refill   ??? baclofen (LIORESAL) 5 mg Tab tablet Take 1 tablet (5 mg total) by mouth at bedtime. 90 tablet 1   ??? clindamycin (CLEOCIN T) 1 % external solution Apply topically daily. 60 mL 11   ??? clobetasoL (TEMOVATE) 0.05 % ointment Twice daily as needed for spot treatment for up to one week on each spot 60 g 5   ??? doxycycline (VIBRAMYCIN) 100 MG capsule Twice daily for 2 weeks for flare 30 capsule 4   ??? DULoxetine (CYMBALTA) 60 MG capsule Take 1 capsule (60 mg total) by mouth daily. 30 capsule 2   ??? empty container Misc Use as directed to dispose of Humira Pens 1 each 3   ??? HUMIRA PEN CITRATE FREE 40 MG/0.4 ML Inject the contents of 1 pen (40 mg total) under the skin every seven (7) days. 4 each 5   ??? hydrOXYchloroQUINE (PLAQUENIL) 200 mg tablet Take 1 tablet (200 mg total) by mouth Two (2) times a day. 60 tablet 3   ??? indomethacin (INDOCIN SR) 75 mg CR capsule Take 1 capsule (75 mg total) by mouth daily. Do not crush or chew. Swallow capsule whole. Take with food 30 capsule 4   ??? melatonin 3 mg Tab Take 1 tablet (3 mg total) by mouth every evening. 30 tablet 11   ??? meloxicam (MOBIC) 15 MG tablet Take 1 tablet (15 mg total) by mouth daily. 30 tablet 2   ??? metFORMIN (GLUCOPHAGE-XR) 500 MG 24 hr tablet Take 1 tablet (500 mg  total) by mouth Two (2) times a day. 120 tablet 0   ??? montelukast (SINGULAIR) 10 mg tablet Take 1 tablet (10 mg total) by mouth in the morning. 30 tablet 11   ??? multivitamin (THERAGRAN) per tablet Take 1 tablet by mouth daily.     ??? phentermine 15 MG capsule Take 1 capsule (15 mg total) by mouth every morning. once daily before breakfast or 1 to 2 hours after breakfast 30 capsule 2   ??? semaglutide (RYBELSUS) 3 mg Tab Take 3 mg by mouth daily. 30 tablet 3   ??? spironolactone (ALDACTONE) 100 MG tablet Take 1 tablet (100 mg total) by mouth daily. 90 tablet 3   ??? topiramate (TOPAMAX) 25 MG tablet Take 1 tablet (25 mg total) by mouth Two (2) times a day. 60 tablet 1   ??? triamcinolone (KENALOG) 0.1 % cream Twice daily for up to 2 weeks for healing areas 80 g 2     No current facility-administered medications on file prior to visit.         ALLERGIES:    Allergies   Allergen Reactions   ??? Naltrexone Swelling   ??? Augmentin [Amoxicillin-Pot Clavulanate] Rash   ??? Ceclor [Cefaclor] Rash   ??? Penicillins Rash         PATIENT ACTIVE PROBLEM LIST:    Patient Active Problem List   Diagnosis   ??? 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 Pain in thoracic spine at multiple sites   ??? Allergic rhinitis   ??? Morbid obesity (CMS-HCC)   ??? Anxiety   ??? Chronic pain syndrome   ??? Mild intermittent asthma without complication   ??? Vitamin D deficiency   ??? Mixed incontinence   ??? Anxiety, generalized   ??? Depression   ??? Insomnia   ??? Long-term use of hydroxychloroquine         PAST SURGICAL HISTORY:  has a past surgical history that includes Tonsillectomy; Cholecystectomy; Cryotherapy; pr excision turbinate,submucous (Bilateral, 09/16/2017); and Resection of hidaradentits.      Any prior medications for mood?:  ***       a. Prior history of depression:   {yes/no:21137}  b. Depressive symptoms for > 6 months:  {yes/no:21137}  c. Family history of depression:   {yes/no:21137}  d. Anxiety symptoms for > 6 months:  {yes/no:21137}      MOOD SYMPTOMS:      Mood:  {MOOD:23791}     {RRPASTMOODSYMPTOMS:23792}  If history of expansive mood or lability, symptoms were present []  with substance, []  without substance    Sleep: {RRSLEEP:23795}  # of hours of sleep:  {NUMBERS 1-31:20750}   In bed only for sleep or intimacy:  {yes/no:21137}  Watches TV, uses electronics other activities in bed: {yes/no:21137}  Taking naps: {yes/no:21137}  Using over the counter medications for sleep:  {yes/no:21137}    Appetite/Nutrition:     Is your appetite up, down or the same? ***  How many meals per day do you eat? ***  Have you lost or gained weight recently? {YES/NO:21013}   Was the weight loss/gain associated with alcohol or drug use? {yes,no,n/a:36087}   Was the weight loss/gain associated with a medical illness? {yes,no,n/a:36087}     Normal weight reported as:  ***  []  Pt reported being dissatisfied with their weight.  []  Pt endorsed restricting or avoiding particular foods so much that it negatively affects their health or weight.  []  Pt endorsed binge eating large portions of food, eating rapidly, and/or  a sense of lack of control over eating.  []  Pt endorsed vomiting, using laxatives, diuretics, and/or excessive exercise to prevent weight gain.    Caffeine:  []  If yes how much?: ***  What is the latest time in the day you have caffeine: ***  If you have diabetes how are your blood glucose levels ? N/A ***     Interests:  ***    Hopelessness/helplessness:  {Blank multiple:29302::low,moderate,moderate-high,high,denies}  Activity level/energy:   {RRENERGY:24027}  Concentration:   {RR CONCENTRATION:24029}  Psychomotor:   {RRPSYCHOMOTOR:24047}  Motivation:   {RRMOTIVATION:24051}  Concentration/task completion:   {RRCONCENTRATION/TASKCOMPLETION:24208}  Irritability: More than usual {yes/no:21137} If yes: ***  Crying:  If yes: *** times per week, and this is  []  more frequent or []  less frequent than before      ANXIETY SYMPTOMS:    Baseline anxiety: {RRBASELINEANXIETY:24061}    Rumination {Yes/No:22953}  Excessive worry {Yes/No:22953}  Obsessions/Compulsions: {Yes/No:22953}  []  Patient (Pt) endorsed having unwanted thoughts, urges, and/or images.   []  Pt endorsed having to perform physical acts to avoid or reduce distress.   If Presence of compulsive/problematic behaviors, does patient have concerns regarding any recurrent behaviors that produce short-term reward despite adverse consequences?   []  N/A, skip to next section  []  Gambling  []  Computer/Video Gaming   []  Internet Use  []  Compulsive Theft/Kleptomania  []  Shopping/Buying/Spending  []  Skin/Hair Picking  []  Eating unusual things (for example: dirt, paint, etc.)  []  Sex/Sexual Behaviors  []  Tanning  []  Exercise   []  Compulsive Eating  []  Excessive hand washing  []  Excessive lock checking  []  Other:   If any of the above are applicable, answer the following:  Does the behavior interfere with functioning in work, school, home, finances, or other domains? {YES-DESCRIBE/NO:65354}  Any known family history of this behavior? {YES-DESCRIBE/NO:65354}  Describe onset of behavior: ***  Any prior treatment geared specifically towards this behavior? {YES-DESCRIBE/NO:65354}  Panic attacks:  {Yes/No:22953}  []  Current   *** times per week, and this is []  more frequent, []  less frequent than before. Last attack occurred: ***    Associated symptoms: {RRPANICATTACKS:24190}  []  Past history of panic attacks   []  Pt endorsed worry about having panic attacks:      Nightmares: {Yes/No:22953} If yes, *** times per week, and this is []  more frequent or []  less frequent than before      Trauma / Posttraumatic Stress related symptoms:    Flashbacks: {Yes/No:22953} If yes, *** times per week and this is more frequent []  or less frequent []  than before  Avoidance:  {Yes/No:22953} If yes, avoidance of ***   Reliving:  {Yes/No:22953}  If yes, *** times per week and this is more frequent []  or less frequent []  than before  Dissociation:  {Yes/No:22953}  If yes, *** times per week and this is more frequent []  or less frequent []  than before  []  Pt endorsed losing time, forgetting important details about themself, or found evidence that they had participated in events they cannot recall.  []  Pt endorsed feeling people or places that are familiar are unreal, or that they are detached from their body as though watching themself in a movie.  Hyperstartle:  {Yes/No:22953}      Hypervigilance:  {Yes/No:22953}        Depersonalization:  {Yes/No:22953}    Derealization:  {Yes/No:22953}    Foreshortened sense of future:  {Yes/No:22953}      Additional anxiety symptoms:  Somatic:  {Yes/No:22953}     []   Pt endorsed worrying about physical health more than most people.   []  Pt endorsed getting sick more than most people.  Social:  {Yes/No:22953}    []  Pt endorsed difficulty in engaging in social activities/events, being out in public  []  Pt endorsed difficulty(ies) interfere with daily functioning and/or with relationships      TRAUMA HISTORY:  I'm going to ask you some sensitive questions. If you answer yes, I'm going to ask you about ages and whether or not you got support, but I'm not going to go into a lot of detail. Are you or have you ever been a victim or survivor of:         - childhood emotional, verbal abuse {Yes/No:22953}; physical abuse  {Yes/No:22953}; neglect:  {Yes/No:22953}  If yes, what ages?: ***  Any support or treatment?: {Yes/No:22953}      - sexual abuse or incest or other abuse growing up:  {Yes/No:22953}  If yes, what ages?:   ***    Any support or treatment?: {Yes/No:22953}      - sexual abuse or  rape as an adult:  {Yes/No:22953}  If yes, what ages?:    ***    Any support or treatment?: {Yes/No:22953}       - bias, bullying or hate crimes:  {Yes/No:22953}  If yes, what ages?: ***    Any support or treatment?: {Yes/No:22953}       - domestic violence/intimate partner violence (IPV) -   {Yes/No:22953}    If yes were police or doctors ever involved or should they have been?  {Yes/No:22953}  Any support or treatment?: {Yes/No:22953}  Are you currently safe? {Yes/No:22953}       - crimes involving weapons:  {Yes/No:22953}     - history of exploitation: {Yes/No:22953}  If yes, what ages?: ***    Any support or treatment?: {Yes/No:22953}      - history of head injury: {yes/no:310494} If yes, what ages?: ***  []  with NO loss of consciousness, []  WITH loss of consciousness    - significant car accident(s):  {Yes/No:22953}      - medical trauma:  {Yes/No:22953}      - any significant losses:   ***        SUBSTANCE USE/ABUSE:     Substance Denies First Use Last Use Route Current Freq. Current Amount Method of Acquiring Notes     Nicotine []        Smoking/smoked *** # of years  and    ***  of packs per day.   []  Quit:    Use of electronic or other tobacco products []  Yes   []  No     Alcohol []           Cannabis []           Amphetamines []           Crack/Cocaine []           Benzodiazepines []           Hallucinogens []           Opioids []           Inhalants []           Other (specify) []             Code Route   1 Oral   2 Smoked   3 Inhalation   4 Injection   5 Other         PSYCHOTIC SYMPTOMS:   *** No AVH or internal stimuli indicated  at time of visit.     If yes:  []  Patient endorsed audio hallucinations.  []  Patient endorsed visual hallucinations.  []  Patient endorsed that people are out to get them or that they are being followed by people.       FUNCTIONAL / COGNITIVE SYMPTOMS:   ADLs:    {RRADLS:24203}  IADLs:   {rr:24205}  Memory/recall:   {RRMEMORY/RECALL:24206}      FAMILY HISTORY:   Family History   Problem Relation Age of Onset   ??? Diabetes Father    ??? Arthritis Father    ??? Breast cancer Maternal Grandmother    ??? Cancer Maternal Grandmother    ??? Breast cancer Paternal Grandmother    ??? Cancer Paternal Grandmother    ??? Multiple sclerosis Mother    ??? Hip fracture Mother    ??? Arthritis Brother         Reiter's   ??? Squamous cell carcinoma Neg Hx    ??? Basal cell carcinoma Neg Hx    ??? Melanoma Neg Hx          SOCIAL HISTORY:   Social History     Socioeconomic History   ??? Marital status: Divorced   Occupational History   ??? Occupation: Biomedical scientist   Tobacco Use   ??? Smoking status: Former     Types: Cigarettes   ??? Smokeless tobacco: Never   Vaping Use   ??? Vaping Use: Former   Substance and Sexual Activity   ??? Alcohol use: Yes     Alcohol/week: 2.0 standard drinks     Types: 2 Shots of liquor per week     Comment: occasionally   ??? Drug use: No   ??? Sexual activity: Yes     Partners: Male     Birth control/protection: I.U.D.   Other Topics Concern   ??? Do you use sunscreen? Yes   ??? Tanning bed use? No   ??? Are you easily burned? No   ??? Excessive sun exposure? No   ??? Blistering sunburns? No     Social Determinants of Health     Financial Resource Strain: Low Risk    ??? Difficulty of Paying Living Expenses: Not hard at all   Food Insecurity: No Food Insecurity   ??? Worried About Programme researcher, broadcasting/film/video in the Last Year: Never true   ??? Ran Out of Food in the Last Year: Never true   Transportation Needs: No Transportation Needs   ??? Lack of Transportation (Medical): No   ??? Lack of Transportation (Non-Medical): No           ===================================================================  ASSESSMENT      Engagement:  Patient presented a willingness to participate in treatment   Participation Quality:  {ddparticipationqualities:22407}   Appearance:  {PSY Appearance:23008}   Motor:    {PSY Motor:23010}   Behavior:   sat comfortably in chair   Mood:    {PSY Mood:23012}   Affect:   {PSY Affect:23013}   Cognitive:   alert, attentive, clear level of consciousness   Speech/Language:  {PSY Speech/Lang:23011}   Thought process:   {PSY Thought Process:23015}   Thought content:     {PSY Thought Content:23016}   Perceptual disturbances:     {PSY Perceptual Disturbances:23017}     Orientation:   {PSY Orientation:23018}   Attention:   {PSY Attention:23019}   Concentration:   {PSY Concentration:23020}   Memory:   {PSY Memory:23021}    Fund of knowledge:    {PSY  Fund of Knowledge:23022}   Insight:     {PSY Insight:23023}   Judgment:    {PSY Judgment:23024}   Impulse Control:   {PSY Impulse Control:23555}       Personality Disorder determination:   []  Deferred  []  No indication for personality disorder traits at time of assessment  []  Presentation indicates personality disorder traits  []  Pt endorsed traits of Cluster A personality disorders.  []  Pt endorsed traits of Cluster B personality disorders.  []  Pt endorsed traits of Cluster C personality disorders.       MODES OF INTERVENTION used in this session: Assessment, Exploration, Support as appropriate/indicated, Clarification, Education and Life review      ==================================================================       TREATMENT PLAN    Presentation sufficient for:  ***      Short term goal focused counseling for identified treatment goals:   []  Reduce PHQ / GAD by 5 points  []  Get or keep PHQ / GAD under 10 points  []  Stress and self-care management   []  Referral for outside treatment  []  Bridge to outside treatment  []  Other:        Patient-stated health goal: ***      Next visit to provide introduction to problem solving treatment and mind body stress reduction skill training for:  ***      Goals until next visit:  ***      Consider benefit of ***  Recommend ***      Referrals/Resources provided at intake: ***        [facility] in {NUMBERS 1-10:18281} weeks     If appointment is not on schedule on MyChart within 1-2 business days, patient was informed to call the [facility] Medicine Clinic at [phone] to schedule an appointment.    Note copied to:    []  Kathrin Ruddy, MD  []  Other:      Thank you for the referral and the opportunity to be of service.     If patient doesn't engage in counseling here and/or local supports are indicated, please give patient [facility] 24 hour Crisis Line: [phone] for linkage to local services, 24 hour crisis and mobile crisis supports.

## 2021-11-19 NOTE — Unmapped (Signed)
[facility] Refill Coordination Note    Specialty Medication(s) to be Shipped:   Inflammatory Disorders: Humira    Other medication(s) to be shipped: No additional medications requested for fill at this time     Teresa Bowman, DOB: March 19, 1981  Phone: [phone] (home)       All above HIPAA information was verified with patient.     Was a Nurse, learning disability used for this call? No    Completed refill call assessment today to schedule patient's medication shipment from the [facility] [phone]).  All relevant notes have been reviewed.     Specialty medication(s) and dose(s) confirmed: Regimen is correct and unchanged.   Changes to medications: Teresa Bowman reports no changes at this time.  Changes to insurance: No  New side effects reported not previously addressed with a pharmacist or physician: None reported  Questions for the pharmacist: No    Confirmed patient received a Conservation officer, historic buildings and a Surveyor, mining with first shipment. The patient will receive a drug information handout for each medication shipped and additional FDA Medication Guides as required.       DISEASE/MEDICATION-SPECIFIC INFORMATION        For patients on injectable medications: Patient currently has 2 doses left.  Next injection is scheduled for 11/23/2021.    SPECIALTY MEDICATION ADHERENCE     Medication Adherence    Patient reported X missed doses in the last month: 0  Specialty Medication: Humira  Patient is on additional specialty medications: No  Any gaps in refill history greater than 2 weeks in the last 3 months: no  Demonstrates understanding of importance of adherence: yes  Informant: patient  Reliability of informant: reliable  Confirmed plan for next specialty medication refill: delivery by pharmacy  Refills needed for supportive medications: not needed              Were doses missed due to medication being on hold? No    Humira 40/0.4 mg/ml: 14 days of medicine on hand        REFERRAL TO PHARMACIST     Referral to the pharmacist: Not needed      [facility]     Shipping address confirmed in Epic.     Delivery Scheduled: Yes, Expected medication delivery date: 11/30/2021.     Medication will be delivered via UPS to the prescription address in Epic WAM.    Teresa Bowman   [facility] Specialty Technician

## 2021-11-24 ENCOUNTER — Ambulatory Visit
Admit: 2021-11-24 | Payer: PRIVATE HEALTH INSURANCE | Attending: Student in an Organized Health Care Education/Training Program | Primary: Student in an Organized Health Care Education/Training Program

## 2021-11-24 NOTE — Unmapped (Unsigned)
Internal Medicine Video Visit    This visit is conducted via video conferencing.    Contact Information  Person Contacted: Patient  Contact Phone number: [phone] (home)   Is there someone else in the room? No.   Patient agreed to a video visit    Teresa Bowman is a 41 y.o. female  participating in a video visit.    Reason for visit:  Pink Eye     Subjective:  ***    I have reviewed the problem list, medications, and allergies and have updated/reconciled them if needed.    Objective:  ***         PTHomeBP    The patient???s Average Home Blood Pressure during the last two weeks is :    /           Assessment & Plan:  No diagnosis found.  {TIP - [facility]- RAFF Pilot- Clinical Documentation Specialist Recommendations-  No specialty comments available.   This text will self delete upon signing note:75688}       #Bacterial Conjunctivitis   --Erythromycin 5mg /gram ointment for 5 to 7 days     No follow-ups on file.       Staffed with Dr. Delane Ginger, discussed    {    Coding tips - Do not edit this text, it will delete upon signing of note!    ?? Telephone visits [phone] for Physicians and APP??s and [phone] for Non- Physician Clinicians)- Only use minutes on the phone to determine level of service.    ?? Video visits [phone]) - Use both minutes on video and pre/post minutes to determine level of service.       :75688}    The patient reports they are currently: at home. I spent *** minutes on the {phone audio video visit:67489} with the patient on the date of service. I spent an additional *** minutes on pre- and post-visit activities on the date of service.     The patient was physically located in [place] or a state in which I am permitted to provide care. The patient and/or parent/guardian understood that s/he may incur co-pays and cost sharing, and agreed to the telemedicine visit. The visit was reasonable and appropriate under the circumstances given the patient's presentation at the time.    The patient and/or parent/guardian has been advised of the potential risks and limitations of this mode of treatment (including, but not limited to, the absence of in-person examination) and has agreed to be treated using telemedicine. The patient's/patient's family's questions regarding telemedicine have been answered.     If the visit was completed in an ambulatory setting, the patient and/or parent/guardian has also been advised to contact their provider???s office for worsening conditions, and seek emergency medical treatment and/or call 911 if the patient deems either necessary.

## 2021-11-29 MED FILL — HUMIRA PEN CITRATE FREE 40 MG/0.4 ML: SUBCUTANEOUS | 28 days supply | Qty: 4 | Fill #4

## 2021-12-23 DIAGNOSIS — L732 Hidradenitis suppurativa: Principal | ICD-10-CM

## 2021-12-28 NOTE — Unmapped (Signed)
The [facility] has made a second and final attempt to reach this patient to refill the following medication:Humira.      We have left voicemails on the following phone numbers: [phone] .Sent texts as well    Dates contacted: 2/1 and 2/7  Last scheduled delivery: 1/10    The patient may be at risk of non-compliance with this medication. The patient should call the [facility] at [phone]  Option 4, then Option 2 (all other specialty patients) to refill medication.    Doshia Dalia D Administrator Shared [facility] Specialty Technician

## 2021-12-29 NOTE — Unmapped (Signed)
[facility] Refill Coordination Note    Specialty Medication(s) to be Shipped:   Inflammatory Disorders: Humira 40mg /0.98ml Inj  Other medication(s) to be shipped: No additional medications requested for fill at this time     Teresa Bowman, DOB: 12-Dec-1980  Phone: [phone] (home)     All above HIPAA information was verified with patient.     Was a Nurse, learning disability used for this call? No    Completed refill call assessment today to schedule patient's medication shipment from the [facility] [phone]).  All relevant notes have been reviewed.     Specialty medication(s) and dose(s) confirmed: Regimen is correct and unchanged.   Changes to medications: Dennisse reports starting the following medications: Mobic 15mg  ( 1 tab once daily )  Changes to insurance: No  New side effects reported not previously addressed with a pharmacist or physician: None reported  Questions for the pharmacist: No    Confirmed patient received a Conservation officer, historic buildings and a Surveyor, mining with first shipment. The patient will receive a drug information handout for each medication shipped and additional FDA Medication Guides as required.       DISEASE/MEDICATION-SPECIFIC INFORMATION        For patients on injectable medications: Patient currently has 0 doses left.  Next injection is scheduled for 01/07/2022.    SPECIALTY MEDICATION ADHERENCE     Medication Adherence    Patient reported X missed doses in the last month: 0  Specialty Medication: Humira 40mg /0.62ml Inj  Patient is on additional specialty medications: No  Patient is on more than two specialty medications: No  Informant: patient  Reliability of informant: reliable  Reasons for non-adherence: no problems identified        Were doses missed due to medication being on hold? No    Humira 40mg /0.87ml Inj  : 0 days of medicine on hand     REFERRAL TO PHARMACIST     Referral to the pharmacist: Not needed    [facility]     Shipping address confirmed in Epic.     Delivery Scheduled: Yes, Expected medication delivery date: 12/31/2021.     Medication will be delivered via UPS to the prescription address in Epic WAM.    Jonathan Kirkendoll P [facility] Shared [facility] Specialty Technician

## 2021-12-30 MED FILL — HUMIRA PEN CITRATE FREE 40 MG/0.4 ML: SUBCUTANEOUS | 28 days supply | Qty: 4 | Fill #5

## 2022-01-07 NOTE — Unmapped (Signed)
Dermatology Note     Assessment and Plan:      Hidradenitis Suppurativa, Hurley stage II  - Discussed chronic, relapsing nature of disease and treatment options, including antibiotics, immune modulators, surgery, and laser.  - Continue Humira 40 mg weekly.  - Restart triamcinolone 0.1% cream BID to affected area for up to 1 week at a time as needed for flares. Appropriate use and side effects of topical steroids discussed.  - Patient prefers to avoid oral antibiotics for now due to history of yeast infections  - Discussed potential surgeries if not improved in the future    Inflamed cyst on right labia:  - After discussion of r/b/a, jointly elected to treat with ILK (see procedure note below)  - If recurs again, can consider punch biopsy at next visit.    Hypertrophic scar on back:   - Explained to patient that their scar happens to be slightly thicker than most, it will unlikely progress, but given bothersome will treat with ILK today    Intralesional Kenalog Procedure Note: After the patient was informed of risks (including atrophy and dyspigmentation), benefits and side effects of intralesional steroid injection, the patient elected to undergo injection and verbal consent was obtained. Skin was cleaned with alcohol and injected intralesionally into the sites (below). The patient tolerated the procedure well without complications and was instructed on post-procedure care.  Location(s): back, right labia  Number of sites treated: 2  Kenalog (triamcinolone) Concentration: 40 mg/ml   Volume: 0.2 ml total (0.1 ml to each site)  ??  High risk medication use (Humira)   - Last negative quant gold 12/2020, will recheck today     The patient was advised to call for an appointment should any new, changing, or symptomatic lesions develop.      RTC: 3-4 months  _________________________________________________________________      Chief Complaint     Follow-up of HS    HPI     Teresa Bowman is a 41 y.o. female who presents as a returning patient (last seen by Dr. Janyth Contes on 08/23/2021) to [facility] for follow-up of hidradenitis suppurativa.     HS is doing well overall except for an area both sides of labia that has persisted for awhile.  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 This improved after ILK in the past. No other active areas of HS. Humira has been extremely helpful including for joint symptoms.    Also has raised scar on back at site of previous biopsy that she would like treated today.    Self-reported severity (0-5): 1  VAS pain today: 0  VAS average pain for the last month: 2  Requiring pain medication? Yes.  If so, what type/frequency? mobic and cymbalta  How often in pain?  continuously  Level of odor (0-5): 2  Level of itching (0-5): 2  Dressing changes needed for drainage:No drainage/less than once weekly  How much drainage: no drainage  Flare in the last month (Y/N)? No.  How long ago was the last flare? in the last year  Developing new lesions? less than monthly  Number of inflammatory lesions montly: 1-3  DLQI: 5  Current treatment: humira     How helpful is the current treatment in managing the following aspects of your disease?  Not at all helpful Somewhat helpful Very helpful   Pain ?? ?? x   Decreasing length of flares ?? ?? x  Decreasing new lesions ?? ?? x   Drainage ?? ?? x   Decreasing frequency of flares ?? ?? x   Decreasing severity of flares ?? ?? x   Odor ?? ?? x   ??    The patient denies any other new or changing lesions or areas of concern.     Pertinent Past Medical History     No history of skin cancer   Autoimmune arthropathy    Family History:   Negative for melanoma    Past Medical History, Family History, Social History, Medication List, Allergies, and Problem List were reviewed in the rooming section of Epic.     ROS: Other than symptoms mentioned in the HPI, no fevers, chills, or other skin complaints    Physical Examination     Gen: Well-appearing patient, appropriate, interactive, in no acute distress  Skin: Examination of the back, groin, and external genitalia performed today and pertinent for:   - Right upper back with smooth, firm skin-colored papule about 1-2 cm in size  - Right labia with 3mm erythematous papule  - Well healed scars of mons pubis    Scribe's Attestation: Elsie Stain, MD obtained and performed the history, physical exam and medical decision making elements that were entered into the chart.  Signed by Minette Brine, Scribe, on January 10, 2022 3:17 PM.    ----------------------------------------------------------------------------------------------------------------------  January 10, 2022 9:24 PM. Documentation assistance provided by the Scribe. I was present during the time the encounter was recorded. The information recorded by the Scribe was done at my direction and has been reviewed and validated by me.  ----------------------------------------------------------------------------------------------------------------------

## 2022-01-10 ENCOUNTER — Ambulatory Visit: Admit: 2022-01-10 | Discharge: 2022-01-10 | Payer: PRIVATE HEALTH INSURANCE

## 2022-01-10 DIAGNOSIS — L910 Hypertrophic scar: Principal | ICD-10-CM

## 2022-01-10 DIAGNOSIS — L723 Sebaceous cyst: Principal | ICD-10-CM

## 2022-01-10 DIAGNOSIS — Z79899 Other long term (current) drug therapy: Principal | ICD-10-CM

## 2022-01-10 DIAGNOSIS — L732 Hidradenitis suppurativa: Principal | ICD-10-CM

## 2022-01-10 NOTE — Unmapped (Addendum)
Try Triamcinolone ointment for 2-3 days up to 1 week to treat flare up    Please have your TB test done at [facility]    [address] [facility], [place], [place], [place] [address]

## 2022-01-10 NOTE — Unmapped (Deleted)
Self-reported severity (0-5): 1  VAS pain today: 0  VAS average pain for the last month: 2  Requiring pain medication? Yes.  If so, what type/frequency? mobic and cymbalta  How often in pain?  continuously  Level of odor (0-5): 2  Level of itching (0-5): 2  Dressing changes needed for drainage:No drainage/less than once weekly  How much drainage: no drainage  Flare in the last month (Y/N)? No.  How long ago was the last flare? in the last year  Developing new lesions? less than monthly  Number of inflammatory lesions montly: 1-3  DLQI: 5  Current treatment: humira     How helpful is the current treatment in managing the following aspects of your disease?  Not at all helpful Somewhat helpful Very helpful   Pain   x   Decreasing length of flares   x   Decreasing new lesions   x   Drainage   x   Decreasing frequency of flares   x   Decreasing severity of flares   x   Odor   x

## 2022-01-11 MED ADMIN — triamcinolone acetonide (KENALOG-40) injection 40 mg: 40 mg | INTRALESIONAL | @ 02:00:00 | Stop: 2022-01-10

## 2022-01-12 LAB — QUANTIFERON TB GOLD PLUS
QUANTIFERON ANTIGEN 1 MINUS NIL: 0 [IU]/mL
QUANTIFERON ANTIGEN 2 MINUS NIL: 0.02 [IU]/mL
QUANTIFERON MITOGEN: 4.46 [IU]/mL
QUANTIFERON TB GOLD PLUS: NEGATIVE
QUANTIFERON TB NIL VALUE: 0.02 [IU]/mL

## 2022-01-12 LAB — TB MITOGEN: TB MITOGEN VALUE: 4.48

## 2022-01-12 LAB — TB AG2: TB AG2 VALUE: 0.04

## 2022-01-12 LAB — TB NIL: TB NIL VALUE: 0.02

## 2022-01-12 LAB — TB AG1: TB AG1 VALUE: 0.02

## 2022-01-17 ENCOUNTER — Telehealth
Admit: 2022-01-17 | Discharge: 2022-01-18 | Payer: PRIVATE HEALTH INSURANCE | Attending: Family Medicine | Primary: Family Medicine

## 2022-01-17 DIAGNOSIS — H00011 Hordeolum externum right upper eyelid: Principal | ICD-10-CM

## 2022-01-17 MED ORDER — ERYTHROMYCIN 5 MG/GRAM (0.5 %) EYE OINTMENT
Freq: Three times a day (TID) | OPHTHALMIC | 0 refills | 0.00000 days | Status: CP
Start: 2022-01-17 — End: ?

## 2022-01-17 NOTE — Unmapped (Signed)
[facility]drologist Encounter  This medical encounter was conducted virtually using [email or web address]  TeleHealth protocols.    Patient ID: Teresa Bowman is a 41 y.o. female who presents by video interaction for evaluation.    I have identified myself to the patient and conveyed my credentials to [employer].   Patient has signed informed consent on file in medical record.    Present on Video Call: Is there someone else in the room? No..    Assessment/Plan:    Diagnoses and all orders for this visit:    Hordeolum externum of right upper eyelid     Counseled patient to use warm compresses to help bring the stye to a head and drain as well as baby shampoo to wash face so as not to irritate eye further. Will send in antibiotic eye drops as well.    -- Patient verbalized an understanding of today's assessment and recommendations, as well as the purpose of ongoing medications.    Referred back to PCP for follow up in 4-5 days if needed    Medication adherence and barriers to the treatment plan have been addressed. Opportunities to optimize healthy behaviors have been discussed. Patient / caregiver voiced understanding.         Subjective:     HPI:    Teresa Bowman is 41 y.o. and presents today in the [facility] to be evaluated for stye . The PCP for this patient is Kathrin Ruddy, MD. Pt reports she had a stye in the same area a month or so ago. Took antibiotics and it went away but now has another one.     HPI     ROS    ROS negative unless notated in HPI above.    I have reviewed the problem list, past medical history, past family history, medications, and allergies and have updated/reconciled them if needed.    Objective:   Physical Exam  Constitutional:       Appearance: Normal appearance.   HENT:      Head: Normocephalic.   Eyes:      Extraocular Movements: Extraocular movements intact.      Comments: Right upper lid stye noted.   Pulmonary:      Effort: Pulmonary effort is normal. Neurological:      Mental Status: She is alert and oriented to person, place, and time.   Psychiatric:         Mood and Affect: Mood normal.         Behavior: Behavior normal.         Thought Content: Thought content normal.         Judgment: Judgment normal.         As part of this Video Visit, no in-person exam was conducted.  Video interaction permitted the following observations.          The patient reports they are currently: at home. I spent 6 minutes on the real-time audio and video with the patient on the date of service. I spent an additional 5 minutes on pre- and post-visit activities on the date of service.     The patient was physically located in [place] or a state in which I am permitted to provide care. The patient and/or parent/guardian understood that s/he may incur co-pays and cost sharing, and agreed to the telemedicine visit. The visit was reasonable and appropriate under the circumstances given the patient's presentation at the time.    The patient  and/or parent/guardian has been advised of the potential risks and limitations of this mode of treatment (including, but not limited to, the absence of in-person examination) and has agreed to be treated using telemedicine. The patient's/patient's family's questions regarding telemedicine have been answered.     If the visit was completed in an ambulatory setting, the patient and/or parent/guardian has also been advised to contact their provider???s office for worsening conditions, and seek emergency medical treatment and/or call 911 if the patient deems either necessary.

## 2022-01-17 NOTE — Unmapped (Signed)
Virtual Care Outreach Results as of:  January 17, 2022 2:32 PM        Date of Patients Video: 01/17/22  Time of Patients Video Visit: 1440  Call Attempt's (1-5): 1  Did you speak with the patient?: yes  Confirmed patient's web browser: yes  Confirmed MyChart Login: yes  Confirmed Get Started Completed : yes  Confirmed Camera: yes  Confirmed Microphone: yes  Confirmed Audio: yes  Provided Support: Yes     Patient Satisfied: yes

## 2022-01-19 DIAGNOSIS — N39498 Other specified urinary incontinence: Principal | ICD-10-CM

## 2022-01-21 DIAGNOSIS — L732 Hidradenitis suppurativa: Principal | ICD-10-CM

## 2022-01-21 MED ORDER — HUMIRA PEN CITRATE FREE 40 MG/0.4 ML
SUBCUTANEOUS | 5 refills | 28 days
Start: 2022-01-21 — End: 2022-02-20

## 2022-01-21 NOTE — Unmapped (Signed)
[facility] Refill Coordination Note    Specialty Medication(s) to be Shipped:   Inflammatory Disorders: Humira    Other medication(s) to be shipped: No additional medications requested for fill at this time     Teresa Bowman, DOB: Sep 13, 1981  Phone: [phone] (home)       All above HIPAA information was verified with patient.     Was a Nurse, learning disability used for this call? No    Completed refill call assessment today to schedule patient's medication shipment from the [facility] [phone]).  All relevant notes have been reviewed.     Specialty medication(s) and dose(s) confirmed: Regimen is correct and unchanged.   Changes to medications: Phillip reports no changes at this time.  Changes to insurance: No  New side effects reported not previously addressed with a pharmacist or physician: None reported  Questions for the pharmacist: No    Confirmed patient received a Conservation officer, historic buildings and a Surveyor, mining with first shipment. The patient will receive a drug information handout for each medication shipped and additional FDA Medication Guides as required.       DISEASE/MEDICATION-SPECIFIC INFORMATION        For patients on injectable medications: Patient currently has 1 doses left.  Next injection is scheduled for 01/25/22.    SPECIALTY MEDICATION ADHERENCE     Medication Adherence    Patient reported X missed doses in the last month: 0  Specialty Medication: Humira  Patient is on additional specialty medications: No  Informant: patient              Were doses missed due to medication being on hold? No    Humira 40 mg/0.35ml: 7 days of medicine on hand       REFERRAL TO PHARMACIST     Referral to the pharmacist: Not needed      [facility]     Shipping address confirmed in Epic.     Delivery Scheduled: Yes, Expected medication delivery date: 01/26/22.  However, Rx request for refills was sent to the provider as there are none remaining.     Medication will be delivered via UPS to the prescription address in Epic WAM.    Arnold Long   [facility] Specialty Pharmacist

## 2022-01-21 NOTE — Unmapped (Signed)
Pt last seen 01/10/2022  requesting medication refill  medication pended

## 2022-01-23 MED ORDER — HUMIRA PEN CITRATE FREE 40 MG/0.4 ML
SUBCUTANEOUS | 5 refills | 28.00000 days | Status: CP
Start: 2022-01-23 — End: 2022-02-22
  Filled 2022-01-25: qty 4, 28d supply, fill #0

## 2022-01-28 DIAGNOSIS — L732 Hidradenitis suppurativa: Principal | ICD-10-CM

## 2022-01-28 MED ORDER — CLINDAMYCIN PHOSPHATE 1 % TOPICAL SOLUTION
11 refills | 0.00000 days
Start: 2022-01-28 — End: ?

## 2022-02-01 MED ORDER — CLINDAMYCIN PHOSPHATE 1 % TOPICAL SOLUTION
TOPICAL | 11 refills | 0.00000 days | Status: CP
Start: 2022-02-01 — End: ?

## 2022-02-01 NOTE — Unmapped (Signed)
Refill request for Cleocin T solution.

## 2022-02-14 MED ORDER — DULOXETINE 60 MG CAPSULE,DELAYED RELEASE
ORAL_CAPSULE | Freq: Every day | ORAL | 2 refills | 30 days | Status: CP
Start: 2022-02-14 — End: 2022-05-15

## 2022-02-14 NOTE — Unmapped (Signed)
Refill request received for patient.      Medication Requested: Duloxetine 60 mg  Last Office Visit: 08/19/2021   Next Office Visit: Visit date not found  Last Prescriber: Delin    Please refill if appropriate

## 2022-02-22 NOTE — Unmapped (Signed)
[facility] Refill Coordination Note    Specialty Medication(s) to be Shipped:   Inflammatory Disorders: Humira    Other medication(s) to be shipped: No additional medications requested for fill at this time     Teresa Bowman, DOB: Mar 18, 1981  Phone: [phone] (home)       All above HIPAA information was verified with patient.     Was a Nurse, learning disability used for this call? No    Completed refill call assessment today to schedule patient's medication shipment from the [facility] [phone]).  All relevant notes have been reviewed.     Specialty medication(s) and dose(s) confirmed: Regimen is correct and unchanged.   Changes to medications: Teresa Bowman reports no changes at this time.  Changes to insurance: No  New side effects reported not previously addressed with a pharmacist or physician: None reported  Questions for the pharmacist: No    Confirmed patient received a Conservation officer, historic buildings and a Surveyor, mining with first shipment. The patient will receive a drug information handout for each medication shipped and additional FDA Medication Guides as required.       DISEASE/MEDICATION-SPECIFIC INFORMATION        For patients on injectable medications: Patient currently has 1 doses left.  Next injection is scheduled for 02/22/22.    SPECIALTY MEDICATION ADHERENCE     Medication Adherence    Patient reported X missed doses in the last month: 0  Specialty Medication: Humira 40mg /0.20ml  Patient is on additional specialty medications: No  Patient is on more than two specialty medications: No              Were doses missed due to medication being on hold? No    Humria (CF) 40mg  /0.57ml: 1 days of medicine on hand       REFERRAL TO PHARMACIST     Referral to the pharmacist: Not needed      [facility]     Shipping address confirmed in Epic.     Delivery Scheduled: Yes, Expected medication delivery date: 02/25/22.     Medication will be delivered via UPS to the prescription address in Epic WAM.    Nancy Nordmann [facility] Specialty Technician

## 2022-02-24 MED FILL — HUMIRA PEN CITRATE FREE 40 MG/0.4 ML: SUBCUTANEOUS | 28 days supply | Qty: 4 | Fill #1

## 2022-03-03 ENCOUNTER — Institutional Professional Consult (permissible substitution): Admit: 2022-03-03 | Discharge: 2022-03-04 | Payer: PRIVATE HEALTH INSURANCE

## 2022-03-03 MED ORDER — NITROFURANTOIN MONOHYDRATE/MACROCRYSTALS 100 MG CAPSULE
ORAL_CAPSULE | Freq: Two times a day (BID) | ORAL | 0 refills | 5 days | Status: CN
Start: 2022-03-03 — End: 2022-03-08

## 2022-03-03 MED ORDER — CIPROFLOXACIN 250 MG TABLET
ORAL_TABLET | Freq: Two times a day (BID) | ORAL | 0 refills | 3 days | Status: CP
Start: 2022-03-03 — End: ?

## 2022-03-04 NOTE — Unmapped (Signed)
[facility] Virtual Practice Genitourinary Encounter  This medical encounter was conducted virtually using [email or web address]  TeleHealth protocols.    Patient ID: Teresa Bowman is a 41 y.o. female who presents by telephone interaction for evaluation.    I have identified myself to the patient and conveyed my credentials to [employer].   Patient has signed informed consent on file in medical record.    Present on Phone Call: Is there someone else in the room? No..    Assessment/Plan:    Diagnoses and all orders for this visit:    Suspected UTI  -     ciprofloxacin HCl (CIPRO) 250 MG tablet; Take 1 tablet (250 mg total) by mouth every twelve (12) hours.     Suspect pt has UTI given acute onset of symptoms. Offered to order UA/CX prior to providing treatment. Pt wishes to proceed with empiric therapy. Cipro BID x 3 days sent. She has allergies to PCN/Cephalosporin and Macrobid is not covered by insurance. She will push fluids and follow up with PCP if sx do not resolve.     -- Patient verbalized an understanding of today's assessment and recommendations, as well as the purpose of ongoing medications.    Referred back to PCP for follow up in 3-5 days PRN        Medication adherence and barriers to the treatment plan have been addressed. Opportunities to optimize healthy behaviors have been discussed. Patient / caregiver voiced understanding.          Subjective:     HPI  Teresa Bowman is 41 y.o. and presents today in the [facility] with genitourinary symptoms.  The PCP for this patient is Kathrin Ruddy, MD.      Pt with c/o bladder control issues over the past week. Denies dysuria, hematuria. Does have some urinary urgency and frequency. Has lower back pain, however this is chronic. LMP- unknown has IUD.         ROS  Review of Systems     All other ROS per HPI.    I have reviewed the problem list, past medical history, past family history, medications, and allergies and have updated/reconciled them if needed.              Objective:   This visit was not converted from a video visit to a telephone visit for the following reason: Patient preference    As part of this Telephone Visit, no in-person exam was conducted.           The patient reports they are currently: at home. I spent 8 minutes on the phone with the patient on the date of service. I spent an additional 5 minutes on pre- and post-visit activities on the date of service.     The patient was physically located in [place] or a state in which I am permitted to provide care. The patient and/or parent/guardian understood that s/he may incur co-pays and cost sharing, and agreed to the telemedicine visit. The visit was reasonable and appropriate under the circumstances given the patient's presentation at the time.    The patient and/or parent/guardian has been advised of the potential risks and limitations of this mode of treatment (including, but not limited to, the absence of in-person examination) and has agreed to be treated using telemedicine. The patient's/patient's family's questions regarding telemedicine have been answered.     If the visit was completed in an ambulatory setting, the patient and/or parent/guardian has also  been advised to contact their provider???s office for worsening conditions, and seek emergency medical treatment and/or call 911 if the patient deems either necessary.

## 2022-03-07 ENCOUNTER — Ambulatory Visit
Admit: 2022-03-07 | Discharge: 2022-03-08 | Payer: PRIVATE HEALTH INSURANCE | Attending: Student in an Organized Health Care Education/Training Program | Primary: Student in an Organized Health Care Education/Training Program

## 2022-03-07 DIAGNOSIS — N393 Stress incontinence (female) (male): Principal | ICD-10-CM

## 2022-03-07 DIAGNOSIS — R350 Frequency of micturition: Principal | ICD-10-CM

## 2022-03-07 LAB — CBC W/ AUTO DIFF
BASOPHILS ABSOLUTE COUNT: 0.1 10*9/L (ref 0.0–0.1)
BASOPHILS RELATIVE PERCENT: 0.3 %
EOSINOPHILS ABSOLUTE COUNT: 0.4 10*9/L (ref 0.0–0.5)
EOSINOPHILS RELATIVE PERCENT: 2.5 %
HEMATOCRIT: 40.9 % (ref 34.0–44.0)
HEMOGLOBIN: 13.9 g/dL (ref 11.3–14.9)
LYMPHOCYTES ABSOLUTE COUNT: 4 10*9/L — ABNORMAL HIGH (ref 1.1–3.6)
LYMPHOCYTES RELATIVE PERCENT: 27.8 %
MEAN CORPUSCULAR HEMOGLOBIN CONC: 34.1 g/dL (ref 32.0–36.0)
MEAN CORPUSCULAR HEMOGLOBIN: 30.6 pg (ref 25.9–32.4)
MEAN CORPUSCULAR VOLUME: 89.7 fL (ref 77.6–95.7)
MEAN PLATELET VOLUME: 7.8 fL (ref 6.8–10.7)
MONOCYTES ABSOLUTE COUNT: 0.9 10*9/L — ABNORMAL HIGH (ref 0.3–0.8)
MONOCYTES RELATIVE PERCENT: 6.4 %
NEUTROPHILS ABSOLUTE COUNT: 9.1 10*9/L — ABNORMAL HIGH (ref 1.8–7.8)
NEUTROPHILS RELATIVE PERCENT: 63 %
PLATELET COUNT: 357 10*9/L (ref 150–450)
RED BLOOD CELL COUNT: 4.56 10*12/L (ref 3.95–5.13)
RED CELL DISTRIBUTION WIDTH: 13.1 % (ref 12.2–15.2)
WBC ADJUSTED: 14.5 10*9/L — ABNORMAL HIGH (ref 3.6–11.2)

## 2022-03-07 LAB — URINALYSIS WITH MICROSCOPY WITH CULTURE REFLEX
BACTERIA: NONE SEEN /HPF
BILIRUBIN UA: NEGATIVE
BLOOD UA: NEGATIVE
GLUCOSE UA: NEGATIVE
KETONES UA: NEGATIVE
LEUKOCYTE ESTERASE UA: NEGATIVE
NITRITE UA: NEGATIVE
PH UA: 6.5 (ref 5.0–9.0)
RBC UA: 2 /HPF (ref <=4)
SPECIFIC GRAVITY UA: 1.026 (ref 1.003–1.030)
SQUAMOUS EPITHELIAL: 5 /HPF (ref 0–5)
UROBILINOGEN UA: <2
WBC UA: 1 /HPF (ref 0–5)

## 2022-03-07 LAB — BASIC METABOLIC PANEL
ANION GAP: 6 mmol/L (ref 5–14)
BLOOD UREA NITROGEN: 11 mg/dL (ref 9–23)
BUN / CREAT RATIO: 18
CALCIUM: 9.6 mg/dL (ref 8.7–10.4)
CHLORIDE: 104 mmol/L (ref 98–107)
CO2: 27 mmol/L (ref 20.0–31.0)
CREATININE: 0.62 mg/dL
EGFR CKD-EPI (2021) FEMALE: >90 mL/min/{1.73_m2} (ref >=60)
GLUCOSE RANDOM: 119 mg/dL (ref 70–179)
POTASSIUM: 4.1 mmol/L (ref 3.4–4.8)
SODIUM: 137 mmol/L (ref 135–145)

## 2022-03-07 LAB — HEMOGLOBIN A1C
ESTIMATED AVERAGE GLUCOSE: 126 mg/dL
HEMOGLOBIN A1C: 6 % — ABNORMAL HIGH (ref 4.8–5.6)

## 2022-03-07 NOTE — Unmapped (Signed)
Internal Medicine Clinic Visit    Reason for visit: back pain, nausea     A/P:    ***    There are no diagnoses linked to this encounter.      __________________________________________________________    HPI:    41 y/o F hx of HS on humira, ? Inflammatory arthritis, obesity , urinary incontinence who presents for malaise, back pain , nausea.     She reports having urinary incontinence last week , out the blue was losing control of her bladder. She reports the urine looked a little cloudy.     She was prescribed cipro and took for 2 full days and then developed headaches and dizziness.     She reports still having chronic issues with urinary incontinence. She has had more issues with urinary incontinence since gaining more weight.     She has had issues controlling urine for a long time but much worse last week. She is also developing some back pain, as well as some nausea. She has not vomited. Headache stopped when taking cipro.   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    Pain in thoracic spine at multiple sites    Allergic rhinitis    Morbid obesity (CMS-HCC)    Anxiety    Chronic pain syndrome    Mild intermittent asthma without complication    Vitamin D deficiency    Mixed incontinence    Anxiety, generalized    Depression    Insomnia    Long-term use of hydroxychloroquine    Suspected UTI       Medications:  Reviewed in EPIC  __________________________________________________________    Physical Exam:   Vital Signs:  Vitals:    03/07/22 1624   BP: 125/91   Pulse: 94   Resp: 19   Temp: 36.7 ??C (98 ??F)   TempSrc: Temporal   SpO2: 98%   Weight: (!) 175.7 kg (387 lb 6.4 oz)   Height: 167.6 cm (5' 6)       Gen: Well appearing, NAD  CV: RRR, no murmurs  Pulm: CTA bilaterally, no crackles or wheezes  Abd: Soft, NTND, normal BS. No HSM.  Ext: No edema  *** She has had more issues with urinary incontinence since gaining more weight.     She has had issues controlling urine for a long time but much worse last week. She is also developing some back pain, as well as some nausea in the last 24 hours which made her concerned for worsening infection. She says intermittently she thinks she feels numb when she wipes. She has not vomited. Headache stopped when taking cipro.   __________________________________________________________    Problem List:  Patient Active Problem List   Diagnosis   ??? 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 Pain in thoracic spine at multiple sites   ??? Allergic rhinitis   ??? Morbid obesity (CMS-HCC)   ??? Anxiety   ??? Chronic pain syndrome   ??? Mild intermittent asthma without complication   ??? Vitamin D deficiency   ??? Mixed incontinence   ??? Anxiety, generalized   ??? Depression   ??? Insomnia   ??? Long-term use of hydroxychloroquine   ??? Suspected UTI       Medications:  Reviewed in EPIC  __________________________________________________________    Physical Exam:   Vital Signs:  Vitals:    03/07/22 1624  BP: 125/91   Pulse: 94   Resp: 19   Temp: 36.7 ??C (98 ??F)   TempSrc: Temporal   SpO2: 98%   Weight: (!) 175.7 kg (387 lb 6.4 oz)   Height: 167.6 cm (5' 6)       Gen: obese female in NAD  CV: RRR, no murmurs  Pulm: CTA bilaterally, no crackles or wheezes  Abd: Soft, NTND, normal BS. No HSM. R sided CVA tenderness   Ext: No edema  Neuro : 5/5 BLE strength exam with normal sensation. Reflexes difficult to elicit. Easily able to ambulate from chair to exam table

## 2022-03-07 NOTE — Unmapped (Unsigned)
[facility] at [facility]     Type of visit: face to face    Are you located in [place]? (for virtual visits only) Yes    Reason for visit: .    Questions / Concerns that need to be addressed: headache, nauseous, dizziness.    Screening BP- 125/91, 94    HCDM reviewed and updated in Epic:    We are working to make sure all of our patients??? wishes are updated in Epic and part of that is documenting a Environmental health practitioner for each patient  A Health Care Decision Maker is someone you choose who can make health care decisions for you if you are not able - who would you most want to do this for you????      HCDM (patient stated preference): Teresa Bowman - Mother - [phone]    BPAs completed:      COVID-19 Vaccine Summary  Which COVID-19 Vaccine was administered  Pfizer  Type:  Dates Given:  08/04/2021                       Immunization History   Administered Date(s) Administered   ??? COVID-19 VACC,MRNA,(PFIZER)(PF) 01/29/2020, 02/19/2020, 08/19/2020, 08/04/2021   ??? Covid-19 Vacc, Unspecified 01/29/2020, 02/19/2020, 08/19/2020   ??? DTaP 08/04/2021   ??? Influenza Vaccine Quad (IIV4 PF) 49mo+ injectable 08/15/2018, 09/22/2021   ??? Influenza Vaccine Quad (IIV4 W/PRESERV) 4MO+ 08/19/2020   ??? Influenza Virus Vaccine, unspecified formulation 08/21/2017, 08/08/2019   ??? PNEUMOCOCCAL POLYSACCHARIDE 23 11/19/2017       __________________________________________________________________________________________    SCREENINGS COMPLETED IN FLOWSHEETS    HARK Screening       AUDIT       PHQ2       PHQ9  Thoughts that you would be better off dead, or of hurting yourself in some way: Not at all  PHQ-9 TOTAL SCORE: 9    P4 Suicidality Screener                GAD7       COPD Assessment       Falls Risk

## 2022-03-08 NOTE — Unmapped (Signed)
I will let you know the results of your blood work by tomorrow, and we will also work on getting a CT scan to look for kidney stones.     If your leg incoordination returns we would want you to be evaluated in the ED.

## 2022-03-08 NOTE — Unmapped (Signed)
Called patient to discuss lab work that is overall stable from prior. Leukocytosis noted , however appears somewhat chronic and per chart review,  not previously checked in the setting of possible infection. She is feeling well if not slightly improved since yesterday. She noticed trace amount of blood in urine yesterday though none today. We are still awaiting insurance authorization for CT scan. I do not have great explanation for her worsened urinary incontinence and will discuss MRI vs urology referral with her PCP

## 2022-03-09 ENCOUNTER — Emergency Department
Admit: 2022-03-09 | Discharge: 2022-03-10 | Disposition: A | Discharge status: Home / Self Care | Payer: PRIVATE HEALTH INSURANCE

## 2022-03-09 ENCOUNTER — Ambulatory Visit
Admit: 2022-03-09 | Discharge: 2022-03-10 | Disposition: A | Discharge status: Home / Self Care | Payer: PRIVATE HEALTH INSURANCE

## 2022-03-09 LAB — CBC W/ AUTO DIFF
BASOPHILS ABSOLUTE COUNT: 0.1 10*9/L (ref 0.0–0.1)
BASOPHILS RELATIVE PERCENT: 0.5 %
EOSINOPHILS ABSOLUTE COUNT: 0.3 10*9/L (ref 0.0–0.5)
EOSINOPHILS RELATIVE PERCENT: 1.6 %
HEMATOCRIT: 42 % (ref 34.0–44.0)
HEMOGLOBIN: 14.4 g/dL (ref 11.3–14.9)
LYMPHOCYTES ABSOLUTE COUNT: 5.2 10*9/L — ABNORMAL HIGH (ref 1.1–3.6)
LYMPHOCYTES RELATIVE PERCENT: 26 %
MEAN CORPUSCULAR HEMOGLOBIN CONC: 34.3 g/dL (ref 32.0–36.0)
MEAN CORPUSCULAR HEMOGLOBIN: 30.5 pg (ref 25.9–32.4)
MEAN CORPUSCULAR VOLUME: 89 fL (ref 77.6–95.7)
MEAN PLATELET VOLUME: 7.1 fL (ref 6.8–10.7)
MONOCYTES ABSOLUTE COUNT: 1.4 10*9/L — ABNORMAL HIGH (ref 0.3–0.8)
MONOCYTES RELATIVE PERCENT: 7.2 %
NEUTROPHILS ABSOLUTE COUNT: 13.1 10*9/L — ABNORMAL HIGH (ref 1.8–7.8)
NEUTROPHILS RELATIVE PERCENT: 64.7 %
NUCLEATED RED BLOOD CELLS: 0 /100{WBCs} (ref <=4)
PLATELET COUNT: 390 10*9/L (ref 150–450)
RED BLOOD CELL COUNT: 4.71 10*12/L (ref 3.95–5.13)
RED CELL DISTRIBUTION WIDTH: 12.9 % (ref 12.2–15.2)
WBC ADJUSTED: 20.2 10*9/L — ABNORMAL HIGH (ref 3.6–11.2)

## 2022-03-09 LAB — BASIC METABOLIC PANEL
ANION GAP: 7 mmol/L (ref 5–14)
BLOOD UREA NITROGEN: 13 mg/dL (ref 9–23)
BUN / CREAT RATIO: 20
CALCIUM: 10.5 mg/dL — ABNORMAL HIGH (ref 8.7–10.4)
CHLORIDE: 105 mmol/L (ref 98–107)
CO2: 27.7 mmol/L (ref 20.0–31.0)
CREATININE: 0.66 mg/dL
EGFR CKD-EPI (2021) FEMALE: >90 mL/min/{1.73_m2} (ref >=60)
GLUCOSE RANDOM: 106 mg/dL (ref 70–179)
POTASSIUM: 4 mmol/L (ref 3.4–4.8)
SODIUM: 140 mmol/L (ref 135–145)

## 2022-03-09 LAB — URINALYSIS WITH MICROSCOPY WITH CULTURE REFLEX
BILIRUBIN UA: NEGATIVE
BLOOD UA: NEGATIVE
GLUCOSE UA: NEGATIVE
KETONES UA: NEGATIVE
LEUKOCYTE ESTERASE UA: NEGATIVE
NITRITE UA: NEGATIVE
PH UA: 6.5 (ref 5.0–9.0)
RBC UA: 2 /HPF (ref <=4)
SPECIFIC GRAVITY UA: 1.025 (ref 1.003–1.030)
SQUAMOUS EPITHELIAL: 13 /HPF — ABNORMAL HIGH (ref 0–5)
UROBILINOGEN UA: <2
WBC UA: 2 /HPF (ref 0–5)

## 2022-03-09 LAB — SEDIMENTATION RATE: ERYTHROCYTE SEDIMENTATION RATE: 45 mm/h — ABNORMAL HIGH (ref 0–20)

## 2022-03-09 LAB — HCG QUANTITATIVE, BLOOD: GONADOTROPIN, CHORIONIC (HCG) QUANT: <2.6 m[IU]/mL

## 2022-03-09 LAB — C-REACTIVE PROTEIN: C-REACTIVE PROTEIN: 25 mg/L — ABNORMAL HIGH (ref <=10.0)

## 2022-03-09 NOTE — Unmapped (Signed)
Pt intake call:    Situation: Per inbasket message, patient is now experiencing increase in pain and hematuria.     Background:seen on 03/07/22 and in process of awaiting CT scan for kidney stone protocol. Per provider, due to worsening of symptoms, patient to be advised to go to local ER now.     Assessment:afebrile, pain described as sharp and twinging through upper legs and back.  Patient not showing signs of distress while on phone with nurse. The patient is hesitant to go to ER due to possible exposure to Covid. Encouraged patient of the benefit of being assessed quickly due to the worsening of pain and hematuria and to have the capability of imaging and therapeutics.   Patient agreed she would attempt to find a local ER to be evaluated in.      Recommendation: Local ER now.  Provider made aware.

## 2022-03-10 MED ADMIN — lidocaine (LIDODERM) 5 % patch 1 patch: 1 | TRANSDERMAL | @ 01:00:00

## 2022-03-10 MED ADMIN — ibuprofen (MOTRIN) tablet 600 mg: 600 mg | ORAL | @ 01:00:00 | Stop: 2022-03-09

## 2022-03-10 MED ADMIN — gadobenate dimeglumine (MULTIHANCE) 529 mg/mL (0.1mmol/0.2mL) solution 20 mL: 20 mL | INTRAVENOUS | @ 07:00:00 | Stop: 2022-03-10

## 2022-03-10 NOTE — Unmapped (Signed)
ED Progress Note    11:14 PM: ??41 y.o.??female??with PMH of asthma, fibromyalgia, and anxiety presenting as a transfer from [facility] for MRI spine.  Patient reports that she is on Humira.    Patient developed back pain mostly left-sided and thoracic but also lower, bilateral leg paresthesias, urinary frequency, urinary urgency, incontinence after lifting a box.  She states that she has a history of urinary frequency and urgency however was not as bad as obstruction of the box.  She had a telehealth visit where she was prescribed Cipro that she took for 2 days.  However after 2 days of Cipro she developed hematuria.  Due to the hematuria she came to the emergency department and was transferred to [facility] from [facility] out of concern of her symptoms and need for MRI to rule out spine pathology.  At [facility] her urinalysis was normal, BMP was normal.  Labs were also notable for elevated ESR and CRP that both elevated in the past.  CBC did have leukocytosis with a left shift.  Currently she says that her lower extremity symptoms are better than they were when they started.  She denies perianal and saddle anesthesia.  She is able to stand she is able to move all of her lower extremities.  Sensation is intact in lower extremities.  On her back there is no sign of abscess or trauma.  She is mildly tender to palpation in her left paraspinal thoracic region.  US renal was unremarkable and she had normal PVR.    She states her pain is improved with just the motrin and lidocaine patch she received at [facility].  She is well-appearing and comfortable here and has no lower extremity neuro deficits on examination.  Overall presentation is not consistent with acute cauda equina, or other spinal cord compression.  Current plan is a total spine MRI to rule out epidural abscess given she is immunosuppressed with elevated inflammatory markers.     4:07 AM: patient is suddenly adamant about leaving the hospital right now. She has ripped out her IV on her own.    4:08 AM: MRI results are back.  -No evidence of significant spinal canal stenosis or infection.       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 There is no significant spinal canal narrowing.       -Mild degenerative changes at C6-C7 and L5-S1.    Discussed it is unlikely that these findings explain current presentation. Reassuringly, patient's  symptoms are resolving on their own and with minimal treatments (motrin and lidocaine patch). She was discharged with strict return precautions and advised to follow up outpatient.

## 2022-03-10 NOTE — Unmapped (Signed)
Patient presents to the ED with hematuria & back pain that started yesterday. Lost control of bladder for a few days. Unsure fever

## 2022-03-10 NOTE — Unmapped (Signed)
Emergency Medicine Access Physician [facility])  [facility] - Transfer Request Note    Requesting Provider: [facility]: [facility]    Requesting Service: Emergency Medicine        Reason for transfer request: MRI    Overview of ED course at transferring hospital: Teresa Bowman is a 41 y.o. female with back pain, urinary incontinence and saddle anesthesia.  WBC 20.  No fever. Inflammatory markers pending.      Needs MRI and appropriate consultation based on imaging    Was the patient accepted to the [facility] ED in transfer: Yes.  If no, why?: N/A    Accepting service/expected service information:  Has an inpatient or consulting service been contacted by the [facility], [facility] or the referring provider?: No.  If admitting/consult service contacted by [facility]:  Time of discussion: N/A  Name/service (e.g. Dr. Floyde Parkins, neprology) of inpatient team member/consultant: N/A  Role of admit/consult service member: N/A  Brief summary of call: N/A  Anticipated Inpatient Bed Type Needed: To be determined    Outside Hospital Imaging:  Was Imaging Done at the Referring Hospital:  No.  If yes, what studies?: N/A  [employer]:  [employer], images available in [employer]

## 2022-03-10 NOTE — Unmapped (Signed)
Received mychart messages from patient today regarding new symptoms since I saw her 2 days ago.     I saw her on 4/17 for acute visit in clinic for acute on chronic urinary incontinence , malaise, nausea , hematuria,  CVA pain. Initial concern was highest for UTI , though UA was unremarkable. We have also been pursuing outpatient CT to eval for renal stone though have run into insurance barriers to getting this scheduled.      Since that visit she is having persistent urinary incontinence and now worsening low back pain, pain radiating down legs and ? Intermittent saddle anesthesia.  I advised her that I don't have a clear understanding for what is causing all her symptoms but I feel we need to rule out neurological compromise causing these issues and recommend she present to the ED today for expedited low back imaging.

## 2022-03-10 NOTE — Unmapped (Signed)
[facility]  Emergency Department Provider Note      ED Clinical Impression     Final diagnoses:   Back pain, unspecified back location, unspecified back pain laterality, unspecified chronicity (Primary)   Urinary incontinence, unspecified type   Saddle anesthesia       Initial Impression, ED Course, Assessment and Plan     Impression: Teresa Bowman is a 41 y.o. female with PMH of asthma, fibromyalgia, and anxiety presenting to the ED for evaluation of one week of urinary incontinence and hematuria after an injury caused worsening stabbing back pain radiating down her legs with some numbness.     Vital signs notable for tachycardia to 105, otherwise within normal limits. On exam, patient with midline tenderness palpation of the thoracic and lumbar spines as well as right-sided paraspinal and CVA muscle tenderness.  Abdomen is otherwise benign.  In no acute distress although does have some mild tachycardia.    She has mixed presentation of symptoms, hematuria and flank pain would suggest potential stone versus pyelonephritis however here with a UA that has no microscopic hematuria and recent urine without the same.  Bedside ultrasound not revealing any hydronephrosis.  Feel that stone would be less likely and would not lead to significant urinary incontinence nor numbness to lower extremities and shooting pain from back to legs.  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  She is relatively immunosuppressed on Humira.  Would be concern for worsening disc given the sudden onset of pain after effort but also potential infectious process.  Plan for CBC, BMP, inflammatory markers.  We will plan to transfer to main emergency department for MRI imaging given her new neurologic symptoms.  Patient is agreeable for the same.         Additional Medical Decision Making       Outside Historian(s)  None      External Records Reviewed  Outpatient notes, Prior labs, Prior EKG(s), and Epic Care Everywhere    Independent Interpretation of Studies  I have independently interpreted the following studies:  POCUS -[place]       Labs and radiology results that were available during my care of the patient were independently reviewed by me and considered in my medical decision making.    Portions of this record have been created using Scientist, clinical (histocompatibility and immunogenetics). Dictation errors have been sought, but may not have been identified and corrected.  ____________________________________________    I have reviewed the triage vital signs and the nursing notes.     History     Chief Complaint  Hematuria (/)      HPI   Teresa Bowman is a 41 y.o. female with PMH of asthma, hidradenitis on humira, fibromyalgia, and anxiety presenting to the ED for evaluation of hematuria. The patient reports one week of urinary incontinence and hematuria after an injury caused worsening stabbing back pain radiating down her legs with some numbness in right leg > left. She endorses numbness in her groin.  She is able to ambulate. She is able to control her BMs. She is not currently menstruating but does have some occasional vaginal spotting. She denies prior kidney stones.  Work-up initiated in the outpatient setting initially concerning for pyelonephritis but had a negative urine.  Mild leukocytosis.  Given acute worsening of her incontinence it was recommended she come in for additional imaging.       Past Medical History:   Diagnosis Date   ??? Abnormal  Pap smear of cervix     ?2002   ??? Acute venous embolism and thrombosis of other specified veins(453.89)     ovarian vein thrombosis   ??? Allergic rhinitis    ??? Anxiety    ??? Anxiety    ??? Asthma     mild   ??? Bilateral low back pain with left-sided sciatica 10/06/2014   ??? Chronic pain syndrome     due to lumbar disk disease   ??? Clotting disorder (CMS-HCC)    ??? Decreased libido    ??? Disorder of skin or subcutaneous tissue    ??? Fibromyalgia    ??? Insomnia    ??? Inverted nipple     left nipple inverts x several years   ??? Morbid obesity (CMS-HCC)    ??? Numbness     left side   ??? Obesity    ??? Pain in thoracic spine at multiple sites 10/06/2014   ??? Spontaneous vaginal delivery    ??? Urinary frequency    ??? Urinary incontinence    ??? Venous insufficiency    ??? Weakness     left side       Patient Active Problem List   Diagnosis   ??? 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 Pain in thoracic spine at multiple sites   ??? Allergic rhinitis   ??? Morbid obesity (CMS-HCC)   ??? Anxiety   ??? Chronic pain syndrome   ??? Mild intermittent asthma without complication   ??? Vitamin D deficiency   ??? Mixed incontinence   ??? Anxiety, generalized   ??? Depression   ??? Insomnia   ??? Long-term use of hydroxychloroquine   ??? Suspected UTI       Past Surgical History:   Procedure Laterality Date   ??? CHOLECYSTECTOMY     ??? CRYOTHERAPY      cervix   ??? PR EXCISION TURBINATE,SUBMUCOUS Bilateral 09/16/2017    Procedure: BILATERAL MAXILLARY ANTROSTOMIES;  Surgeon: Meryle Ready, MD;  Location: OR [place];  Service: ENT   ??? Resection of hidaradentits     ??? TONSILLECTOMY         No current facility-administered medications for this encounter.    Current Outpatient Medications:   ???  ciprofloxacin HCl (CIPRO) 250 MG tablet, Take 1 tablet (250 mg total) by mouth every twelve (12) hours. (Patient not taking: Reported on 03/07/2022), Disp: 6 tablet, Rfl: 0  ???  clindamycin (CLEOCIN T) 1 % external solution, Apply to affected area topically every day., Disp: 60 mL, Rfl: 11  ???  clobetasoL (TEMOVATE) 0.05 % ointment, Twice daily as needed for spot treatment for up to one week on each spot, Disp: 60 g, Rfl: 5  ???  doxycycline (VIBRAMYCIN) 100 MG capsule, Twice daily for 2 weeks for flare, Disp: 30 capsule, Rfl: 4  ???  DULoxetine (CYMBALTA) 60 MG capsule, Take 1 capsule (60 mg total) by mouth daily., Disp: 30 capsule, Rfl: 2  ???  empty container Misc, Use as directed to dispose of Humira Pens, Disp: 1 each, Rfl: 3  ???  erythromycin (ROMYCIN) 5 mg/gram (0.5 %) ophthalmic ointment, Administer to the right eye Three (3) times a day., Disp: 3.5 g, Rfl: 0  ???  HUMIRA PEN CITRATE FREE 40 MG/0.4 ML, Inject the contents of 1 pen (40 mg total) under the skin every seven (7) days., Disp: 4 each, Rfl: 5  ???  hydrOXYchloroQUINE (PLAQUENIL) 200 mg tablet, Take 1 tablet (200 mg total) by  mouth Two (2) times a day. (Patient not taking: Reported on 03/07/2022), Disp: 60 tablet, Rfl: 3  ???  indomethacin (INDOCIN SR) 75 mg CR capsule, Take 1 capsule (75 mg total) by mouth daily. Do not crush or chew. Swallow capsule whole. Take with food, Disp: 30 capsule, Rfl: 4  ???  melatonin 3 mg Tab, Take 1 tablet (3 mg total) by mouth every evening., Disp: 30 tablet, Rfl: 11  ???  meloxicam (MOBIC) 15 MG tablet, Take 1 tablet (15 mg total) by mouth daily., Disp: 30 tablet, Rfl: 2  ???  metFORMIN (GLUCOPHAGE-XR) 500 MG 24 hr tablet, Take 1 tablet (500 mg total) by mouth Two (2) times a day. (Patient not taking: Reported on 03/07/2022), Disp: 120 tablet, Rfl: 0  ???  montelukast (SINGULAIR) 10 mg tablet, Take 1 tablet (10 mg total) by mouth in the morning., Disp: 30 tablet, Rfl: 11  ???  multivitamin (THERAGRAN) per tablet, Take 1 tablet by mouth daily., Disp: , Rfl:   ???  phentermine 15 MG capsule, Take 1 capsule (15 mg total) by mouth every morning before breakfast or 1 to 2 hours after breakfast, Disp: 30 capsule, Rfl: 2  ???  semaglutide (RYBELSUS) 3 mg Tab, Take 3 mg by mouth daily., Disp: 30 tablet, Rfl: 3  ???  spironolactone (ALDACTONE) 100 MG tablet, Take 1 tablet (100 mg total) by mouth daily., Disp: 90 tablet, Rfl: 3  ???  topiramate (TOPAMAX) 25 MG tablet, Take 1 tablet (25 mg total) by mouth Two (2) times a day., Disp: 60 tablet, Rfl: 1  ???  triamcinolone (KENALOG) 0.1 % cream, Twice daily for up to 2 weeks for healing areas, Disp: 80 g, Rfl: 2    Allergies  Naltrexone, Augmentin [amoxicillin-pot clavulanate], Ceclor [cefaclor], and Penicillins    Family History   Problem Relation Age of Onset   ??? Diabetes Father    ??? Arthritis Father    ??? Breast cancer Maternal Grandmother    ??? Cancer Maternal Grandmother    ??? Breast cancer Paternal Grandmother    ??? Cancer Paternal Grandmother    ??? Multiple sclerosis Mother    ??? Hip fracture Mother    ??? Arthritis Brother         Reiter's   ??? Squamous cell carcinoma Neg Hx    ??? Basal cell carcinoma Neg Hx    ??? Melanoma Neg Hx        Social History  Social History     Tobacco Use   ??? Smoking status: Former     Types: Cigarettes   ??? Smokeless tobacco: Never   Vaping Use   ??? Vaping Use: Former   Substance Use Topics   ??? Alcohol use: Yes     Alcohol/week: 2.0 standard drinks     Types: 2 Shots of liquor per week     Comment: occasionally   ??? Drug use: No         Physical Exam     ED Triage Vitals [03/09/22 1735]   Enc Vitals Group      BP 159/84      Heart Rate 105      SpO2 Pulse       Resp 16      Temp 36.7 ??C (98.1 ??F)      Temp Source Oral      SpO2 99 %      Weight (!) 175.5 kg (387 lb)      Height 1.676 m (5' 6)  Constitutional: Alert and oriented. Afebrile.   Eyes: Conjunctivae are normal.  ENT       Head: Normocephalic and atraumatic.       Nose: No congestion.       Mouth/Throat: Mucous membranes are moist.       Neck: No stridor. No midline cervical spine tenderness  Cardiovascular: tachycardic, regular rhythm.   Respiratory: Normal respiratory effort. Breath sounds are normal.  Gastrointestinal: Soft and nontender. Mild right CVA tenderness.   Musculoskeletal: midline t and l spine tenderness to palpation with right paraspinal tenderness. Normal range of motion in all extremities.  Neurologic: Normal speech and language. No gross focal neurologic deficits are appreciated.  Skin: Skin is warm, dry and intact. No rash noted.  Psychiatric: Mood and affect are normal. Speech and behavior are normal.        Radiology     ED POCUS Renal (Limited)    (Results Pending)   MRI Cervical Thoracic Lumbar Spine W Wo Contrast    (Results Pending)       I independently visualized these images. Pocus images revealing no right sided hydronephrosis     Procedures     Procedure(s) performed: None.    Attestations     Documentation assistance was provided by Mohammed Kindle, Scribe on March 09, 2022 at 8:12 PM for Jiles Crocker, MD.     Documentation assistance was provided by the scribe in my presence.  The documentation recorded by the scribe has been reviewed by me and accurately reflects the services I personally performed.             Jonetta Speak, MD  03/10/22 0002

## 2022-03-12 MED ORDER — PENICILLIN V POTASSIUM 500 MG TABLET
ORAL_TABLET | Freq: Three times a day (TID) | ORAL | 0 refills | 5 days | Status: CP
Start: 2022-03-12 — End: 2022-03-17

## 2022-03-12 NOTE — Unmapped (Signed)
Immediately after or during the visit, I reviewed with the resident the medical history and the resident???s findings on physical examination.?? I discussed with the resident the patient???s diagnosis and concur with the treatment plan as documented in the resident note. Danielle Dess, MD

## 2022-03-12 NOTE — Unmapped (Signed)
ED Progress Note    Spoke to the patient regarding her urine culture. She is still having symptoms with urinary frequency and some incontinence. Although her culture may be a contaminant will ppx treat with penicillin. She has not been compliant with the prescribed cipro.     I have reviewed her allergies and she states she is able to take PCN without side effect.     She denies fevers, chills, N/V/D and my suspicion for pyelonephritis is low. She has chronic upper back pain that has been stable for a long time.

## 2022-03-12 NOTE — Unmapped (Signed)
Urine Culture  Order: [id number] - Reflex for Order [id number]  Status: Final result       Visible to patient: Yes (seen)    Specimen Information: Clean Catch; Urine   0 Result Notes  Urine Culture, Comprehensive 10,000 to 50,000 CFU/mL Streptococcus agalactiae (group b) Abnormal    Group B Strep are susceptible to ampicillin and penicillin.  Please consult the Microbiology Lab [phone]) if  further susceptibility testing is needed.      Specimen Source: Clean Catch        Resulting Agency: [facility]           Specimen Collected: 03/09/22 17:41 Last Resulted: 03/10/22 14:12         Abnormal lab as above. Sent to [facility] for review

## 2022-03-14 ENCOUNTER — Ambulatory Visit
Admit: 2022-03-14 | Discharge: 2022-03-15 | Payer: PRIVATE HEALTH INSURANCE | Attending: Student in an Organized Health Care Education/Training Program | Primary: Student in an Organized Health Care Education/Training Program

## 2022-03-14 DIAGNOSIS — D72829 Elevated white blood cell count, unspecified: Principal | ICD-10-CM

## 2022-03-14 DIAGNOSIS — M549 Dorsalgia, unspecified: Principal | ICD-10-CM

## 2022-03-14 DIAGNOSIS — Z6841 Body Mass Index (BMI) 40.0 and over, adult: Principal | ICD-10-CM

## 2022-03-14 MED ORDER — METFORMIN ER 500 MG TABLET,EXTENDED RELEASE 24 HR
ORAL_TABLET | Freq: Two times a day (BID) | ORAL | 1 refills | 90 days | Status: CP
Start: 2022-03-14 — End: 2022-09-10

## 2022-03-14 NOTE — Unmapped (Unsigned)
[facility] at [facility]     Type of visit: face to face    Are you located in [place]? (for virtual visits only) Yes    Reason for visit: Follow up    Questions / Concerns that need to be addressed: lab results    Screening BP- 144/99, 110    Omron BPs (complete if screening BP has a systolic  > 130 or diastolic > 80)  BP#1 131/93, 105   BP#2 152/94, 107  BP#3 135/95, 101    Average BP 139/94, 104  (please note this as a comment in vitals)       HCDM reviewed and updated in Epic:    We are working to make sure all of our patients??? wishes are updated in Epic and part of that is documenting a Environmental health practitioner for each patient  A Health Care Decision Maker is someone you choose who can make health care decisions for you if you are not able - who would you most want to do this for you????  was updated.    HCDM (patient stated preference): Milus Banister - Mother - [phone]      COVID-19 Vaccine Summary  Which COVID-19 Vaccine was administered  Pfizer  Type:  Dates Given:  08/04/2021                       Immunization History   Administered Date(s) Administered    COVID-19 VACC,MRNA,(PFIZER)(PF) 01/29/2020, 02/19/2020, 08/19/2020, 08/04/2021    Covid-19 Vacc, Unspecified 01/29/2020, 02/19/2020, 08/19/2020    DTaP 08/04/2021    Influenza Vaccine Quad (IIV4 PF) 71mo+ injectable 08/15/2018, 09/22/2021    Influenza Vaccine Quad (IIV4 W/PRESERV) 37MO+ 08/19/2020    Influenza Virus Vaccine, unspecified formulation 08/21/2017, 08/08/2019    PNEUMOCOCCAL POLYSACCHARIDE 23 11/19/2017       __________________________________________________________________________________________    SCREENINGS COMPLETED IN FLOWSHEETS    HARK Screening       AUDIT       PHQ2       PHQ9          P4 Suicidality Screener                GAD7       COPD Assessment       Falls Risk

## 2022-03-14 NOTE — Unmapped (Signed)
Internal Medicine Clinic Visit    Reason for visit: ***    A/P:    ***    There are no diagnoses linked to this encounter.      __________________________________________________________    HPI:    41 y/o F hx of hydradenitis suppurativa on humira who presents for follow up after appointment last week.       She wanted to follow up regarding MRI and lab results from last week.     She is curious about what weight loss medications she has not tried.     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    Pain in thoracic spine at multiple sites    Allergic rhinitis    Morbid obesity (CMS-HCC)    Anxiety    Chronic pain syndrome    Mild intermittent asthma without complication    Vitamin D deficiency    Mixed incontinence    Anxiety, generalized    Depression    Insomnia    Long-term use of hydroxychloroquine    Suspected UTI       Medications:  Reviewed in EPIC  __________________________________________________________    Physical Exam:   Vital Signs:  Vitals:    03/14/22 1555   BP: 139/94   Pulse: 104   Resp: 18   Temp: 36.4 ??C (97.6 ??F)   TempSrc: Temporal   SpO2: 97%   Weight: (!) 175.4 kg (386 lb 9.6 oz)   Height: 167.6 cm (5' 6)       Gen: Well appearing, NAD  CV: RRR, no murmurs  Pulm: CTA bilaterally, no crackles or wheezes  Abd: Soft, NTND, normal BS. No HSM.  Ext: No edema  *** Darnelle Spangle, discussed.     __________________________________________________________    HPI:    41 y/o F hx of hydradenitis suppurativa on humira, obesity who presents for follow up labs/imaging results after appointment last week.        She wanted to follow up regarding MRI and lab results from last week, as she did not entirely understand what was explained to her after ED visit. Based on urine cultures from the ER, she was prescribed additional course of antibiotics with penicillin and says her urinary symptoms are improved.  She is curious about what can be done about disc herniation as seen in thoracic spine, as she says this localizes well to the area she has had back pain for so long.  She is not having any weakness or sensory changes in her lower extremities.     She is curious about what weight loss medications she has not tried.     __________________________________________________________    Problem List:  Patient Active Problem List   Diagnosis   ??? 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 Pain in thoracic spine at multiple sites   ??? Allergic rhinitis   ??? Morbid obesity (CMS-HCC)   ??? Anxiety   ??? Chronic pain syndrome   ??? Mild intermittent asthma without complication   ??? Vitamin D deficiency   ??? Mixed incontinence   ??? Anxiety, generalized   ??? Depression   ??? Insomnia   ??? Long-term use of hydroxychloroquine   ??? Suspected UTI   ??? Hidradenitis suppurativa       Medications:  Reviewed in EPIC  __________________________________________________________    Physical Exam:   Vital Signs:  Vitals:  03/14/22 1555   BP: 139/94   Pulse: 104   Resp: 18   Temp: 36.4 ??C (97.6 ??F)   TempSrc: Temporal   SpO2: 97%   Weight: (!) 175.4 kg (386 lb 9.6 oz)   Height: 167.6 cm (5' 6)       Gen: Well appearing, NAD  CV: RRR, no murmurs  Pulm: CTA bilaterally, no crackles or wheezes  Abd: Soft, NTND, normal BS. No HSM.  Ext: No edema

## 2022-03-19 NOTE — Unmapped (Signed)
Dermatology Note     Assessment and Plan:      Hidradenitis Suppurativa, Hurley stage II - chronic, stable  - Discussed chronic, relapsing nature of disease and treatment options, including antibiotics, immune modulators, surgery, and laser.  - Continue Humira 40 mg weekly.  - Patient prefers to avoid oral antibiotics for now due to history of yeast infections  - Discussed potential surgeries if not improved in the future  - After discussion of r/b/a, jointly elected to treat with ILK (see procedure note below)  - Continue triamcinolone 0.1% cream BID to affected area for up to 1 week at a time as needed for flares. Appropriate use and side effects of topical steroids discussed.    Intralesional Kenalog Procedure Note: After the patient was informed of risks (including atrophy and dyspigmentation), benefits and side effects of intralesional steroid injection, the patient elected to undergo injection and verbal consent was obtained. Skin was cleaned with alcohol and injected intralesionally into the sites (below). The patient tolerated the procedure well without complications and was instructed on post-procedure care.  Location(s): pubic area  Number of sites treated: 3  Kenalog (triamcinolone) Concentration: 10 mg/ml   Volume: 0.6 ml total      High risk medication use (Humira)   - Negative quant gold 12/2021    The patient was advised to call for an appointment should any new, changing, or symptomatic lesions develop.      RTC: next scheduled follow up  _________________________________________________________________      Chief Complaint     Chief Complaint   Patient presents with    HIDRADENITIS     FLARES-GROIN AREA-1 WEEK       HPI     Teresa Bowman is a 41 y.o. female who presents as a returning patient (last seen 01/10/22) to [facility] for follow-up of hidradenitis suppurativa. Today patient reports she began flaring on her mons pubis and at the edge of her scar on the left pubic area about 1 week ago but has been less inflamed over the last 3-4 days. She has used triamcinolone to HS lesions at times but does not find it helpful. She feels her HS is overall much improved since starting Humira.    Self-reported severity (0-5): 1  VAS pain today: 3  VAS average pain for the last month:  no answer  Requiring pain medication? Yes.  If so, what type/frequency? Cymbalta, Meloxicam, Baclofen  How often in pain?  continuously  Level of odor (0-5): 3  Level of itching (0-5): 3  Dressing changes needed for drainage:No drainage/less than once weekly  How much drainage: no drainage  Flare in the last month (Y/N)? No.  How long ago was the last flare? in the last year  Developing new lesions? less than monthly  Number of inflammatory lesions montly: 1-3  DLQI: 10  Current treatment: Humira     How helpful is the current treatment in managing the following aspects of your disease?  Not at all helpful Somewhat helpful Very helpful   Pain   x   Decreasing length of flares   x   Decreasing new lesions   x   Drainage   x   Decreasing frequency of flares   x   Decreasing severity of flares   x   Odor   x       The patient denies any other new or changing lesions or areas of concern.     Pertinent Past Medical  History     No history of skin cancer   Autoimmune arthropathy    Family History:   Negative for melanoma    Past Medical History, Family History, Social History, Medication List, Allergies, and Problem List were reviewed in the rooming section of Epic.     ROS: Other than symptoms mentioned in the HPI, no fevers, chills, or other skin complaints    Physical Examination     Gen: Well-appearing patient, appropriate, interactive, in no acute distress  Skin: Examination of the groin and external genitalia performed today and pertinent for:     - Well healed scars with slight palpable firmness adjacent to scars but no surface change that was visible  - All sites not commented on are within normal limits    Scribe's Attestation: Elsie Stain, MD obtained and performed the history, physical exam and medical decision making elements that were entered into the chart.  Signed by Minette Brine, Scribe, on Mar 21, 2022 at 3:01 PM.    ----------------------------------------------------------------------------------------------------------------------  Mar 21, 2022 10:43 PM. Documentation assistance provided by the Scribe. I was present during the time the encounter was recorded. The information recorded by the Scribe was done at my direction and has been reviewed and validated by me.  ----------------------------------------------------------------------------------------------------------------------

## 2022-03-21 ENCOUNTER — Ambulatory Visit: Admit: 2022-03-21 | Discharge: 2022-03-22 | Payer: PRIVATE HEALTH INSURANCE

## 2022-03-21 DIAGNOSIS — M1990 Unspecified osteoarthritis, unspecified site: Principal | ICD-10-CM

## 2022-03-21 DIAGNOSIS — Z79899 Other long term (current) drug therapy: Principal | ICD-10-CM

## 2022-03-21 DIAGNOSIS — L732 Hidradenitis suppurativa: Principal | ICD-10-CM

## 2022-03-21 DIAGNOSIS — L910 Hypertrophic scar: Principal | ICD-10-CM

## 2022-03-21 MED ORDER — MELOXICAM 15 MG TABLET
ORAL_TABLET | 3 refills | 0 days | Status: CP
Start: 2022-03-21 — End: ?

## 2022-03-21 NOTE — Unmapped (Signed)
Meloxicam refill  Last ov: 10/12/2021  Next ov: 04/20/2022

## 2022-03-21 NOTE — Unmapped (Signed)
[facility] Refill Coordination Note    Specialty Medication(s) to be Shipped:   Inflammatory Disorders: Humira    Other medication(s) to be shipped: No additional medications requested for fill at this time     Teresa Bowman, DOB: 1981/10/08  Phone: [phone] (home)       All above HIPAA information was verified with patient.     Was a Nurse, learning disability used for this call? No    Completed refill call assessment today to schedule patient's medication shipment from the [facility] [phone]).  All relevant notes have been reviewed.     Specialty medication(s) and dose(s) confirmed: Regimen is correct and unchanged.   Changes to medications: Laiana reports no changes at this time.  Changes to insurance: No  New side effects reported not previously addressed with a pharmacist or physician: None reported  Questions for the pharmacist: No    Confirmed patient received a Conservation officer, historic buildings and a Surveyor, mining with first shipment. The patient will receive a drug information handout for each medication shipped and additional FDA Medication Guides as required.       DISEASE/MEDICATION-SPECIFIC INFORMATION        For patients on injectable medications: Patient currently has 1 doses left.  Next injection is scheduled for 03/22/2022.    SPECIALTY MEDICATION ADHERENCE     Medication Adherence    Patient reported X missed doses in the last month: 0  Specialty Medication: Humira  Patient is on additional specialty medications: No        Were doses missed due to medication being on hold? No    REFERRAL TO PHARMACIST     Referral to the pharmacist: Not needed      [facility]     Shipping address confirmed in Epic.     Delivery Scheduled: Yes, Expected medication delivery date: 03/25/2022.     Medication will be delivered via UPS to the prescription address in Epic WAM.    Teresa Bowman [facility] Specialty Technician

## 2022-03-22 MED ADMIN — triamcinolone acetonide (KENALOG) injection 10 mg: 10 mg | INTRALESIONAL | @ 03:00:00 | Stop: 2022-03-21

## 2022-03-22 NOTE — Unmapped (Signed)
You are welcome to join the [facility] HS of the [place] Support group online at [email or web address] or in-person at our regular meetings.  You can textHS to [phone] for meeting reminders or join the group online for regular updates.  This can be a great opportunity to interact and learn from other patients and help work with the HS community.  We hope to see your there!    Hidradentis Suppurativa (pronounced ???high-drad-en-eye-tis/sup-your-uh-tee-vah???) is a chronic disease of hair follicles.  The lesions occur most commonly on areas of skin-to-skin contact: under the arms (axillary area), in the groin, around the buttocks, in the region around the anus and genitals, and on the skin between and under the breasts. In women, the underarms, groin, and breast areas are most commonly affected. Men most often have HS lesions on the buttocks and under the arms and may also have HS at the back of the neck and behind and around the ears.    What does HS look and feel like?   The first thing that someone with HS notices is a tender, raised, red bump that looks like an under-the-skin pimple or boil. Sometimes HS lesions have two or more ???heads.???  In mild disease only an occasional boil or abscess may occur, but in more active disease there can be many new lesions every month.  Some abscesses can become larger and may open and drain pus.  Bleeding and increased odor can also occur. In severe disease, deeper abscesses develop and may connect with each other under the skin to form tunnel-like tracts (sinuses, fistulas).  These may drain constantly, or may temporarily improve and then usually begin draining again over time.  In people who have had sinus tracts for some time, scars form that feel like ropes under the skin. In the very worst cases, networks of sinus tracts can form deeper in the body, including the muscle and other tissues. Many people with severe HS have scars that can limit their ability to freely move their arms or legs, though this is very unlikely for most patients.     Clinicians usually classify or ???grade??? HS using the Hurley staging system according to the severity of the disease for each body location:   Hurley stage I: one or more abscesses are present, but no sinus tracts have formed and no scars have developed   Hurley stage II: one or more abscesses are present that resolve and recur; on sinus tract can be present and scarring is seen   Hurley stage III: many abscesses and more than one sinus tract is present with extensive scars.    What causes HS?  The cause of HS is not completely understood.  It seems to be a disorder of hair follicles and often many family members are affected so genetics probably play a strong role.  Bacteria are often present and may make the disease worse, but infection does not seem to be the main cause. Hormones are also likely play a role since the condition typically starts around puberty when hair follicles under the arms and in the groin start to change.  It can sometimes flare with menstrual cycles in women as well.  In most cases it lasts for decades and starts to improve to some extent in the late 30s and 40s as long as many fistulas have not already formed.  Women are three times more likely than men to develop HS.    Other factors are known to contribute to HS   flaring or becoming worse, though they are likely not the main causes. The factors most commonly associated with HS include:   Cigarette smoking - Stopping smoking will likely not cure the disease, but likely is helpful in reducing how much and how often it flares and may prevent it from getting as bad over time.   Higher weight - HS may occur even in people that are not overweight, but it is much more common in patients that are.  There is some evidence that losing weight and eating a diet low in sugars and fats may be helpful in improving hidradenitis, though this is not helpful for everyone.  Working with a nutritionist may be an important way to help with this and is something your physician can help coordinate    Hidradenitis is not contagious.  It is not caused by a problem with personal hygiene or any other activity or behavior of those with the disease.    How can your doctor help you treat your hidradenitis?  Clinicians use both medication and surgery to treat HS. The choice of treatment--or combination of treatments--is made according to an individual patient???s needs. Clinicians consider several factors in determining the most appropriate plan for therapy:   Severity of disease - medications and some laser treatments are usually able to control disease best when fistulas are not present.  Fistulas typically require surgery.   Extent and location of disease   Chronicity (how often the lesions recur)    A number of different surgical methods have been developed that are useful for certain patients under particular circumstances. These can be done with local numbing and healing at home for some areas when disease is not too extensive with relatively brief recovery times.  In more extensive disease there may be a need for larger excisions under general anesthesia with healing time in the hospital and prolonged recovery periods for better disease control.      In addition, many medical treatments have been tried--some with more success than others. No medication is effective for all patients, and you and your doctor may have to try several different treatments or combinations of treatments before you find the treatment plan that works best for you.  The goals of therapy with medications that are either topical (used on the skin) or systemic (taken by mouth) are:  1. to clear the lesions or at least reduce their number and extent, and  2. to prevent new lesions from forming.  3. To reduce pain, drainage, and odor  Some of the types of medications commonly used are antibacterial skin washes and the topical antibiotics to prevent secondary infections and corticosteroid injections into the lesions to reduce inflammation.     Other medications that may be used include retinoids (similar to Accutane), drugs that effect how hormones and hair follicles interact, drugs that affect your immune system (such as methotrexate, adalimumab/Humira, and Remicaid/infliximab), steroids, and oral antibiotics.    Lasers that destroy hair follicles can also be helpful since they reduce the hair follicles that cause the problems.  Multiple treatments are typically required over time and there is some discomfort associated with treatment, but it is typically very fast and well-tolerated.    It is very important to realize that hidradenitis cannot usually be completely cured with any single medication or surgical procedure.  It is a disease that can be very stubborn and difficult to control, but with good treatment a lot of improvement and sometimes temporary remissions can be   obtained. Poorly controlled disease can cause more fistulas to form and make managing the disease much more difficult over time so it is important to seek care to reduce major flares.  Surgery can provide a long term cure in some areas, though the disease can start again or continue in nearby areas.  A dermatologist is often the best person to help coordinate disease treatment, and sometimes other surgeons, pain specialists, other specialists, and nutritionists may be part of the treatment team.    For severe disease, the first goal is often to reduce pain and symptoms with medicines so that the disease feels more stable. Once it's stable, we often start thinking about how to address areas that have completely gotten better with surgery if they are still causing problems.    What can you do to help your HS?  1. Stopping smoking is hard and may not fix everything, but it may be a step in the right direction.  We or your primary care physician can provide resources to help stop if you are interested.  2. Follow a healthy diet and try to achieve a healthy weight.  Some other self-help measures are:   Keep your skin cool and dry (becoming overheated and sweating can contribute to an HS flare)   To reduce the pain of cysts or nodules or to help them to drain, apply hot compresses or soak in hot water for 10 minutes at a time (use a clean washcloth or a teabag soaked in hot water)   For female patients, cotton underwear that does not have tight elastic in the groin can be helpful.  Boyshort, brief, or boxer style underwear may be a better option as friction on hair follicles in affected areas can be a major trigger in some patients.  These can be easily found on Amazon or with some retailers.  Fruit of the Loom and Underworks are two brands that are sometimes recommended.    Finally, know that you are not alone. Coping with the pain and other symptoms of HS can be very difficult, so it may be helpful to connect with others who live with HS. Patient groups and networks can be sources of important information and support. Some internet resources for information and connections are provided below.    Psychologytoday.com is a resource to find psychologists and therapists that can help support you in your are     AASECT.org can help connect with sexual health resources and counselors    Resources for Information    The Hidradenitis Suppurativa Foundation: A nonprofit organized by a group of physicians interested in treating and advancing research in hidradenitis suppurativa.  This group advocates for better care and research for hidradenitis and has educational materials put together specifically for patients that have been reviewed and produced by doctors and people with hidradenitis.    American Academy of Dermatology  http://www.aad.org/dermatology-a-to-z/diseases-and-treatments/e---h/hidradenitis-suppurativa/signs-and-symptoms    National Library of Medicine  http://www.nlm.nih.gov/medlineplus/hidradenitissuppurativa.html  NORD: National Organization for Rare Disorders, Inc  https://www.rarediseases.org/rare-disease-information/rare-diseases/byID/358/viewAbstract  Trials of new medications for HS  Https://www.clinicaltrials.gov

## 2022-03-22 NOTE — Unmapped (Signed)
Addended by: Danielle Dess on: 03/22/2022 01:49 PM     Modules accepted: Level of Service

## 2022-03-22 NOTE — Unmapped (Signed)
Immediately after or during the visit, I reviewed with the resident the medical history and the resident???s findings on physical examination.?? I discussed with the resident the patient???s diagnosis and concur with the treatment plan as documented in the resident note. Danielle Dess, MD

## 2022-03-24 ENCOUNTER — Ambulatory Visit: Admit: 2022-03-24 | Discharge: 2022-03-25 | Payer: PRIVATE HEALTH INSURANCE

## 2022-03-24 DIAGNOSIS — N3941 Urge incontinence: Principal | ICD-10-CM

## 2022-03-24 DIAGNOSIS — R109 Unspecified abdominal pain: Principal | ICD-10-CM

## 2022-03-24 DIAGNOSIS — N3001 Acute cystitis with hematuria: Principal | ICD-10-CM

## 2022-03-24 MED FILL — HUMIRA PEN CITRATE FREE 40 MG/0.4 ML: SUBCUTANEOUS | 28 days supply | Qty: 4 | Fill #2

## 2022-03-24 NOTE — Unmapped (Addendum)
Bladder irritants can vary from person to person.  Below are some common foods/drinks that can irritate the bladder and worsen bladder symptoms such as urgency, frequency, and even bladder leakage.  If you want to see if these are triggers for you, you can eliminate these foods/drinks from your diet and then slowly add them back as tolerated.    Most Bothersome Foods and Drinks    FOODS  Citrus: grapefruit, oranges, pineapples  Hot peppers  Pickles  Sauerkraut  Tomatoes and tomato products, including pasta sauces  Chocolate  Spicy foods DRINKS  Coffee (caffeinated and decaf)  Tea (caffeinated and some decaf)  All carbonated drinks - including sparkling water and sodas  Diet or energy drinks  Alcohol: Including beer and wine (red and white), champagne  Juice (cranberry, orange, grapefruit, pineapple and lemon)   CONDIMENTS  Chili  Horseradish  Salad dressings  Vinegar OTHER  MSG  Artificial sweeteners  Some vitamins in high doses: C and B  Tobacco and smoking  Nitrites (in lunch meats)       -Avoid bladder irritants as listed above - can try taking Tums with acidic irritants  -Void regularly during the day - every 2-3 hours during the day  -Hold fluids 2-3 hours prior to bedtime     -To treat urinary frequency, urgency, and leakage we start by following a bladder diet and timed voiding  -Recent urinary symptoms may be due to urinary tract infection  -Urine dip today completely clear -please finish the whole course of amoxicillin  -After treatment of a UTI, your bladder is more sensitive so you may need to baby it while it is healing by avoiding irritants  -You may take over-the-counter Azo as needed     -Renal imaging was reassuring  -If no improvement in 2 to 3 weeks, we could consider starting a medication to help the bladder be calmer (less frequency, urgency, leakage)  -Could also consider cystoscopy which is done in our office where our attending will take a scope and look inside your bladder  -Please contact the office right away if you develop blood you can see in your urine again    -Plan to follow-up for reassessment in ~3 to 4 weeks with virtual visit or in office -may need to be placed on cancellation list  -You may also send a message in MyChart with any updates

## 2022-03-24 NOTE — Unmapped (Signed)
[facility]  New Patient Evaluation & Consultation    Referring Provider: Jeannette Corpus, MD  PCP: Kathrin Ruddy, MD  Date of Service: 03/24/2022    SUBJECTIVE  Chief Complaint: Urinary Incontinence    History of Present Illness: Teresa Bowman is a 41 y.o. White female seen in consultation at the request of Dr. Shelva Majestic for evaluation of UI.    Patient works as a [employer] and a couple weeks ago developed back pain, urinary frequency, urgency and gross hematuria  She had a telemedicine visit and was prescribed Cipro however discontinued after 2 days due to not tolerating the medication  She went to the hospital and urine culture showed group B strep -she has started on amoxicillin  She no longer sees blood in her urine but still has urinary frequency urgency and some leakage although leakage is improving  She reports right flank pain - when at the hospital a renal ultrasound was found negative for stones and hydronephrosis  MRI done showing diffuse disc bulge L5-S1 with no spinal canal narrowing. Mild right neural foraminal narrowing at L5-S1. No significant spinal canal or neural foraminal narrowing.   She currently drinks 1 cup coffee, 1 glass juice, 2 sodas, and 1-2 bottles of water daily.    Medical records personally reviewed and found significant for:  On 01/17/22 patient sent message to PCP: Hi, can you send a referral to a urologist for me? I know you said that incontinence medication has alot of side effects but honestly I wet myself almost on a daily basis now, I can't hold my urine and it leaks out. Referral placed to urogynecology. A1c on 03/07/22 of 6.0%. Nl Scr.    Urinary Symptoms:  Stress urinary incontinence: no.   Urgency urinary incontinence: yes.   Leaks 2-4 time(s) per day(s).   Pad use:  2-3  pads per day.    She is bothered by her UI symptoms.    Nocturia: 0-1 times per night to void.  Splinting or shifting to void: no     UTIs: 0 UTI's in the last year.   She denies any history of kidney stones or hematuria.    Pelvic Organ Prolapse Symptoms:                   She denies a feeling of a bulge the vaginal area.   She denies seeing a bulge.     Bowel Symptom:  Bowel movements: Every day or every other day.    Stool consistency: soft.    She denies accidental bowel leakage / fecal incontinence.  Straining: No.  Splinting: No.  Incomplete evacuation: No.  Fecal urgency: No.    MEDICAL HISTORY:   Past OB/GYN History:  G2 P2  Vaginal deliveries: 2 Forceps delivery: 1 Cesarean section: 0  Hysterectomy: no  She is not menopausal.    She is sexually active.   She does not have dyspareunia.  Last pap smear was 03/11/20 - neg.  History of abnormal pap smears: yes.    Last colonoscopy was N/A.     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), Depression (10/20/2021), Disorder  of skin or subcutaneous tissue, Fibromyalgia, Hidradenitis suppurativa (03/14/2022), 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), Insomnia, Inverted nipple, Leukocytosis (03/24/2022), 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     Past Surgical History: She  has a past surgical history that includes Tonsillectomy; Cholecystectomy; Cryotherapy; pr excision turbinate,submucous (Bilateral, 09/16/2017); and Resection of hidaradentits.     Social History: Patient  reports that she has quit smoking. Her smoking use included cigarettes. She has been exposed to tobacco smoke. She has never used smokeless tobacco. She reports that she does not currently use alcohol after a past usage of about 2.0 standard drinks per week. She reports that she does not use drugs. She lives with her 2 children.  She is employed as a [employer] fast [employer]er.     Family History: family history includes Arthritis in her brother and father; Breast cancer in her maternal grandmother and paternal grandmother; Cancer in her maternal grandmother and paternal grandmother; Diabetes in her father; Hip fracture in her mother; Multiple sclerosis in her mother.    Medications: She has a current medication list which includes the following prescription(s): baclofen, clindamycin, clobetasol, doxycycline, duloxetine, empty container, erythromycin, humira(cf) pen, melatonin, meloxicam, montelukast, multivitamin, spironolactone, triamcinolone, and victoza 2-pak.    Allergies: Patient is allergic to naltrexone, augmentin [amoxicillin-pot clavulanate], ceclor [cefaclor], and penicillins.     ROS  Review of Systems: A10 system review of systems was negative with positives as noted in the HPI.    OBJECTIVE  Physical Exam:  BP 112/73  - Pulse 103  - Ht 167 cm (5' 5.75)  - Wt (!) 175.6 kg (387 lb 1.6 oz)  - BMI 62.96 kg/m??   Constitutional: Well-developed, well-nourished. No acute distress.  Psych: Normal mood and affect. Memory intact.    ENMT: External ears are normal. Hearing is grossly normal.   Respiratory: Normal respiratory effort, no acute respiratory distress. No audible wheeze. Cardiovascular:  No lower extremity edema bilaterally.   Skin: Warm and dry. No visible rashes.   Gastrointestinal: Obese. Soft, non-distended. No abdominal tenderness. No rebound/guarding. (+) CVA tenderness with percussion at right.  Musculoskeletal: Ambulatory, extremities warm and well perfused.    GU / Detailed Urogynecologic Evaluation:  Pelvic Exam: External genitalia normal without erythema or rash. Along its length, the urethra is palpably normal without tenderness, discharge, scarring, or diverticulum.   Urethral meatus normal; (-) CST, (-) VST, in supine position.    Speculum exam reveals vaginal tissues without  atrophic changes.   Cervix normal. No CMT or abnormalities of the cervical os. Uterus normal, single, non-tender. Adnexa no masses, fullness, or tenderness.     Pelvic floor tone normal, strength II/V. Pelvic floor muscles relaxed and without spasm or tenderness.   No prolapse with cough or Valsalva         View : No data to display.                Rectal Exam:   no sign of dyssynergia when asking the patient to bear down.    Post-Void Residual (PVR) by Bladder Scan:  In order to evaluate bladder emptying, we discussed obtaining a postvoid residual and she agreed to this procedure.  Procedure: The ultrasound unit was placed on the patient???s abdomen in the suprapubic region after the patient had voided. A PVR of 30 ml was obtained by bladder scan.    Laboratory Results:  Urine dipstick shows:   Results for orders placed or performed in visit on 03/24/22   POCT Urinalysis Dipstick   Result Value Ref Range    Spec Gravity/POC 1.020 1.003 - 1.030    PH/POC 7.0 5.0 - 9.0    Leuk Esterase/POC Negative Negative    Nitrite/POC Negative Negative    Protein/POC Negative Negative    UA Glucose/POC Negative Negative    Ketones, POC Negative Negative    Bilirubin/POC Negative Negative    Blood/POC Negative Negative    Urobilinogen/POC 0.2 0.2 - 1.0 mg/dL         I visualized the urine specimen, noting the specimen to be urine color: clear yellow     ASSESSMENT AND PLAN  Ms. Kinch is a 41 y.o. with:   1. Urge incontinence    2. Right flank pain    3. Acute hemorrhagic cystitis        Plan:  Acute onset urinary frequency, urgency, urge incontinence x2 weeks:  Hemorrhagic cystitis:  -Occurred with back pain but it also made 30-50 deliveries that day in and out of her truck  -Possible UTI and treated via telemedicine with Cipro x2 days -did not tolerate Cipro and stopped  -Developed gross hematuria and was seen at the ED in [place]  -Negative renal ultrasound  -Urine culture at ED grew group B strep -has taken 2 days of amoxicillin  -Urinary symptoms are improving and there is resolution of gross hematuria, still has right flank pain -no history of kidney stones    -She will complete full prescription of amoxicillin and avoid all bladder irritants as discussed today as her bladder is healing  -She may take over-the-counter Azo as needed  -Increase water intake  -Reassess in 3 to 4 weeks with virtual visit -may be placed on cancellation list -any provider  -If no improvement in urge incontinence, consider trial of OAB med, cystoscopy  -If again sees gross hematuria she will contact the office with plan for cystoscopy    -Patient will call the clinic or use MyChart should anything change or any new issues arise  -All questions answered    No follow-ups on file.    Mahala Menghini, MS, PA-C  [facility] [employer] present for exam: Eusebio Friendly, CMA     Disclaimer: Please note that the above dictation was partly created using Dragon dictation software. Inaccuracies may be present due to transcription errors. For any questions or clarification of the above please contact the provider directly.    Medical Decision Making - Amount and Complexity of Data  1 point: I reviewed and/or ordered a clinical laboratory test.   1 point: I decided to obtain the patient's outside medical records.  2 points: I independently visualized the image, tracing or specimen itself.  2 points: I have reviewed/summarized the patient's outside medical records OR I have obtained history during today's visit from someone other than the patient.

## 2022-04-04 DIAGNOSIS — Z6841 Body Mass Index (BMI) 40.0 and over, adult: Principal | ICD-10-CM

## 2022-04-04 MED ORDER — METFORMIN ER 500 MG TABLET,EXTENDED RELEASE 24 HR
ORAL_TABLET | 1 refills | 0 days
Start: 2022-04-04 — End: ?

## 2022-04-05 DIAGNOSIS — G894 Chronic pain syndrome: Principal | ICD-10-CM

## 2022-04-05 DIAGNOSIS — M1990 Unspecified osteoarthritis, unspecified site: Principal | ICD-10-CM

## 2022-04-05 MED ORDER — METFORMIN ER 500 MG TABLET,EXTENDED RELEASE 24 HR
ORAL_TABLET | 1 refills | 0 days | Status: CP
Start: 2022-04-05 — End: ?

## 2022-04-11 ENCOUNTER — Ambulatory Visit
Admit: 2022-04-11 | Discharge: 2022-04-11 | Payer: PRIVATE HEALTH INSURANCE | Attending: Student in an Organized Health Care Education/Training Program | Primary: Student in an Organized Health Care Education/Training Program

## 2022-04-11 DIAGNOSIS — Z79899 Other long term (current) drug therapy: Principal | ICD-10-CM

## 2022-04-11 DIAGNOSIS — L732 Hidradenitis suppurativa: Principal | ICD-10-CM

## 2022-04-11 MED ORDER — HUMIRA(CF) PEN 80 MG/0.8 ML SUBCUTANEOUS KIT
SUBCUTANEOUS | 11 refills | 28.00000 days | Status: CP
Start: 2022-04-11 — End: ?
  Filled 2022-04-19: qty 4, 28d supply, fill #0

## 2022-04-11 MED ORDER — CLINDAMYCIN PHOSPHATE 1 % TOPICAL SWAB
Freq: Two times a day (BID) | TOPICAL | 2 refills | 0.00000 days | Status: CP
Start: 2022-04-11 — End: ?

## 2022-04-11 MED ADMIN — triamcinolone acetonide (KENALOG-40) injection 40 mg: 40 mg | INTRALESIONAL | @ 17:00:00 | Stop: 2022-04-11

## 2022-04-11 NOTE — Unmapped (Signed)
Clinical Assessment Needed For: Dose Change  Medication: HUMIRA(CF) PEN 80 mg/0.8 mL Pnkt (adalimumab)  Last Fill Date/Day Supply: 01/04/2021 / 28  Copay $0  Was previous dose already scheduled to fill: No    Notes to Pharmacist: Last fill was loading dose.

## 2022-04-11 NOTE — Unmapped (Signed)
We will try to get Humira 80 mg weekly approved. Until then, continue 40 mg weekly.

## 2022-04-11 NOTE — Unmapped (Signed)
Dermatology Note     Assessment and Plan:      Hidradenitis Suppurativa, Hurley stage II - chronic, flaring today on vulva  - Discussed chronic, relapsing nature of disease and treatment options, including antibiotics, immune modulators, surgery, and laser.  - she has been well-controlled on Humira 40 mg weekly but has been flaring more intermittently recently, discussed options and jointly elected to increase humira to 80 mg weekly, sent to [facility]   - Patient prefers to avoid oral antibiotics for now due to history of yeast infections  - Discussed potential surgeries if not improved in the future  - Continue spironolactone 100 mg daily   - After discussion of r/b/a, jointly elected to treat with ILK (see procedure note below) to flaring lesions  - Continue triamcinolone 0.1% cream BID to affected area for up to 1 week at a time as needed for flares.   Appropriate use and side effects of topical steroids discussed.  - Start clindamycin 1% wipes BID to groin   - Restart metformin     Intralesional Kenalog Procedure Note: After the patient was informed of risks (including atrophy and dyspigmentation), benefits and side effects of intralesional steroid injection, the patient elected to undergo injection and verbal consent was obtained. Skin was cleaned with alcohol and injected intralesionally into the sites (below). The patient tolerated the procedure well without complications and was instructed on post-procedure care.  Location(s): pubic area  Number of sites treated: 3  Kenalog (triamcinolone) Concentration: 40 mg/ml   Volume: 0.5 ml total      High risk medication use (Humira)   - Negative quant gold 12/2021    The patient was advised to call for an appointment should any new, changing, or symptomatic lesions develop.      RTC: next scheduled follow up  _________________________________________________________________      Chief Complaint     Chief Complaint   Patient presents with    HS       HPI Teresa Bowman. [name] is a 41 y.o. female who presents as a returning patient (last seen 03/21/22) to [facility] for follow-up of hidradenitis suppurativa. At last visit continued on humira 40 mg weekly. Also had several lesions injected with intralesional kenalog.    Today notes flaring on groin. Flaring more recently, has been controlled on humira for about 2 years. Previously was on metformin but cannot tolerate due to nausea. Taking spironolactone 100 mg daily, has some breast tenderness.    Self-reported severity (0-5): 1  VAS pain today: 3  VAS average pain for the last month:  no answer  Requiring pain medication? Yes.  If so, what type/frequency? Cymbalta, Meloxicam, Baclofen  How often in pain?  continuously  Level of odor (0-5): 3  Level of itching (0-5): 3  Dressing changes needed for drainage:No drainage/less than once weekly  How much drainage: no drainage  Flare in the last month (Y/N)? No.  How long ago was the last flare? in the last year  Developing new lesions? less than monthly  Number of inflammatory lesions montly: 1-3  DLQI: 10  Current treatment: Humira     How helpful is the current treatment in managing the following aspects of your disease?  Not at all helpful Somewhat helpful Very helpful   Pain   x   Decreasing length of flares   x   Decreasing new lesions   x   Drainage   x   Decreasing frequency of flares  x   Decreasing severity of flares   x   Odor   x       The patient denies any other new or changing lesions or areas of concern.     Pertinent Past Medical History     No history of skin cancer   Autoimmune arthropathy    Family History:   Negative for melanoma    Past Medical History, Family History, Social History, Medication List, Allergies, and Problem List were reviewed in the rooming section of Epic.     ROS: Other than symptoms mentioned in the HPI, no fevers, chills, or other skin complaints    Physical Examination     Gen: Well-appearing patient, appropriate, interactive, in no acute distress  Skin: Examination of the groin and external genitalia performed today and pertinent for:     - Inflamed nodules on vulva and mons pubis  - Well healed scars with slight palpable firmness adjacent to scars but no surface change that was visible  - All sites not commented on are within normal limits

## 2022-04-12 ENCOUNTER — Ambulatory Visit: Admit: 2022-04-12 | Discharge: 2022-04-13 | Payer: PRIVATE HEALTH INSURANCE

## 2022-04-12 NOTE — Unmapped (Signed)
This encounter was created in error - please disregard.

## 2022-04-15 NOTE — Unmapped (Incomplete)
I reviewed the dose change with Teresa Bowman to 80 mg/week.     Previously she had done well on Humira and had felt like it was controlling her HS flares. More recently, she's had increased flaring. She had questions about adverse effects of biologics (PML, cancer) that we talked through.    [facility] Clinical Assessment & Refill Coordination Note    Teresa Bowman, [place]: [birth year]  Phone: [phone] (home)     All above HIPAA information was verified with patient.     Was a Nurse, learning disability used for this call? No    Specialty Medication(s):   Inflammatory Disorders: Humira     Current Outpatient Medications   Medication Sig Dispense Refill    adalimumab (HUMIRA,CF, PEN) 80 mg/0.8 mL PnKt Inject the contents of 1 pen (80 mg total) under the skin once a week. 4 each 11    baclofen (LIORESAL) 10 MG tablet Take 1 tablet (10 mg total) by mouth Three (3) times a day.      clindamycin (CLEOCIN T) 1 % external solution Apply to affected area topically every day. 60 mL 11    clindamycin (CLEOCIN T) 1 % Swab Apply 1 application. topically Two (2) times a day. To groin 60 each 2    clobetasoL (TEMOVATE) 0.05 % ointment Twice daily as needed for spot treatment for up to one week on each spot 60 g 5    doxycycline (VIBRAMYCIN) 100 MG capsule Twice daily for 2 weeks for flare 30 capsule 4    DULoxetine (CYMBALTA) 60 MG capsule Take 1 capsule (60 mg total) by mouth daily. 30 capsule 2    empty container Misc Use as directed to dispose of Humira Pens 1 each 3    erythromycin (ROMYCIN) 5 mg/gram (0.5 %) ophthalmic ointment Administer to the right eye Three (3) times a day. 3.5 g 0    HUMIRA PEN CITRATE FREE 40 MG/0.4 ML Inject the contents of 1 pen (40 mg total) under the skin every seven (7) days. 4 each 5    lisdexamfetamine (VYVANSE) 20 MG cap capsule Take 1 capsule (20 mg total) by mouth.      melatonin 3 mg Tab Take 1 tablet (3 mg total) by mouth every evening. 30 tablet 11    meloxicam (MOBIC) 15 MG tablet TAKE 1 TABLET BY MOUTH DAILY 90 tablet 3    metFORMIN (GLUCOPHAGE-XR) 500 MG 24 hr tablet TAKE 1 TABLET BY MOUTH TWICE (2) DAILY 180 tablet 1    montelukast (SINGULAIR) 10 mg tablet Take 1 tablet (10 mg total) by mouth in the morning. 30 tablet 11    multivitamin (THERAGRAN) per tablet Take 1 tablet by mouth daily.      spironolactone (ALDACTONE) 100 MG tablet Take 1 tablet (100 mg total) by mouth daily. 90 tablet 3    triamcinolone (KENALOG) 0.1 % cream Twice daily for up to 2 weeks for healing areas 80 g 2    VICTOZA 2-PAK 0.6 mg/0.1 mL (18 mg/3 mL) injection Inject 0.1 mL (0.6 mg total) under the skin daily.       No current facility-administered medications for this visit.        Changes to medications: Teresa Bowman reports no changes at this time.    Allergies   Allergen Reactions    Naltrexone Swelling    Augmentin [Amoxicillin-Pot Clavulanate] Rash    Ceclor [Cefaclor] Rash    Penicillins Rash       Changes to allergies:  No    SPECIALTY MEDICATION ADHERENCE     Humira - 1 left (40 mg)  Medication Adherence    Patient reported X missed doses in the last month: 0  Specialty Medication: Humira          Specialty medication(s) dose(s) confirmed: Regimen is correct and unchanged.     Are there any concerns with adherence? No    Adherence counseling provided? Not needed    CLINICAL MANAGEMENT AND INTERVENTION      Clinical Benefit Assessment:    Do you feel the medicine is effective or helping your condition?  Yes, but lessened benefit, hence the dose increase    Clinical Benefit counseling provided? Not needed    Adverse Effects Assessment:    Are you experiencing any side effects? No    Are you experiencing difficulty administering your medicine? No    Quality of Life Assessment:    Quality of Life    Rheumatology  Oncology  Dermatology  1. What impact has your specialty medication had on the symptoms of your skin condition (i.e. itchiness, soreness, stinging)?: Some  2. What impact has your specialty medication had on your comfort level with your skin?: Some  Cystic Fibrosis          Have you discussed this with your provider? Not needed    Acute Infection Status:    Acute infections noted within Epic:  No active infections  Patient reported infection: None    Therapy Appropriateness:    Is therapy appropriate and patient progressing towards therapeutic goals? Yes, therapy is appropriate and should be continued    DISEASE/MEDICATION-SPECIFIC INFORMATION      For patients on injectable medications: Patient currently has 1 doses left.  Next injection is scheduled for Tues, 5/30 (will plan to start increased dose on 6/6) .    PATIENT SPECIFIC NEEDS     Does the patient have any physical, cognitive, or cultural barriers? No    Is the patient high risk? No    Does the patient require a Care Management Plan? No   SHIPPING     Specialty Medication(s) to be Shipped:   Inflammatory Disorders: Humira    Other medication(s) to be shipped: No additional medications requested for fill at this time     Changes to insurance: No    Delivery Scheduled: Yes, Expected medication delivery date: Wed, 5/31.     Medication will be delivered via UPS to the confirmed prescription address in [facility].    The patient will receive a drug information handout for each medication shipped and additional FDA Medication Guides as required.  Verified that patient has previously received a Conservation officer, historic buildings and a Surveyor, mining.    The patient or caregiver noted above participated in the development of this care plan and knows that they can request review of or adjustments to the care plan at any time.      All of the patient's questions and concerns have been addressed.    Teresa Bowman A Teresa Bowman Shared [facility] Specialty Pharmacist health and/or quality of life? {YES/NO/PATIENTDECLINED:93004}    Social Determinants of Health     Financial Resource Strain: Low Risk     Difficulty of Paying Living Expenses: Not hard at all   Internet Connectivity: Not on file   Food Insecurity: No Food Insecurity    Worried About Programme researcher, broadcasting/film/video in the Last Year: Never true    Ran Out of  Food in the Last Year: Never true   Tobacco Use: Medium Risk    Smoking Tobacco Use: Former    Smokeless Tobacco Use: Never    Passive Exposure: Past   Housing/Utilities: Low Risk     Within the past 12 months, have you ever stayed: outside, in a car, in a tent, in an overnight shelter, or temporarily in someone else's home (i.e. couch-surfing)?: No    Are you worried about losing your housing?: No    Within the past 12 months, have you been unable to get utilities (heat, electricity) when it was really needed?: No   Alcohol Use: Not At Risk    How often do you have a drink containing alcohol?: Never    How many drinks containing alcohol do you have on a typical day when you are drinking?: 1 - 2    How often do you have 5 or more drinks on one occasion?: Never   Transportation Needs: No Transportation Needs    Lack of Transportation (Medical): No    Lack of Transportation (Non-Medical): No   Substance Use: Low Risk     Taken prescription drugs for non-medical reasons: Never    Taken illegal drugs: Never    Patient indicated they have taken drugs in the past year for non-medical reasons: Yes, [positive answer(s)]: Not on file   Health Literacy: Low Risk     : Never   Physical Activity: Not on file   Interpersonal Safety: Not on file   Stress: Not on file   Intimate Partner Violence: Not At Risk    Fear of Current or Ex-Partner: No    Emotionally Abused: No    Physically Abused: No    Sexually Abused: No   Depression: Not at risk    PHQ-2 Score: 0   Social Connections: Not on file       Would you be willing to receive help with any of the needs that you have identified today? {Yes/No/Not applicable:93005}       SHIPPING     Specialty Medication(s) to be Shipped:   {specpharm:59087}    Other medication(s) to be shipped: {Blank:19197::***,No additional medications requested for fill at this time}     Changes to insurance: {Blank:19197::Yes: ***,No}    Delivery Scheduled: {Blank:19197::Yes, Expected medication delivery date: ***.,Yes, Expected medication delivery date: ***.  However, Rx request for refills was sent to the provider as there are none remaining.,Patient declined refill at this time due to ***.,No, cannot schedule delivery at this time as there are outstanding items that need addressed.  This note has been handed off to the provider for follow up.,Due to patient insurance changes, unable to fill at [facility], please route Rx to *** specialty pharmacy}     Medication will be delivered via {Blank:19197::UPS,Next Day Courier,Same Day Courier,Clinic Courier - *** clinic,***} to the confirmed {Blank:19197::prescription,temporary} address in [facility].    The patient will receive a drug information handout for each medication shipped and additional FDA Medication Guides as required.  Verified that patient has previously received a Conservation officer, historic buildings and a Surveyor, mining.    The patient or caregiver noted above participated in the development of this care plan and knows that they can request review of or adjustments to the care plan at any time.      All of the patient's questions and concerns have been addressed.    Teresa Bowman   [facility] Specialty  Pharmacist

## 2022-04-19 ENCOUNTER — Ambulatory Visit: Admit: 2022-04-19 | Discharge: 2022-04-20 | Payer: PRIVATE HEALTH INSURANCE

## 2022-04-19 DIAGNOSIS — D72829 Elevated white blood cell count, unspecified: Principal | ICD-10-CM

## 2022-04-19 LAB — CBC W/ AUTO DIFF
BASOPHILS ABSOLUTE COUNT: 0.1 10*9/L (ref 0.0–0.1)
BASOPHILS RELATIVE PERCENT: 0.5 %
EOSINOPHILS ABSOLUTE COUNT: 0.3 10*9/L (ref 0.0–0.5)
EOSINOPHILS RELATIVE PERCENT: 1.9 %
HEMATOCRIT: 39.2 % (ref 34.0–44.0)
HEMOGLOBIN: 13 g/dL (ref 11.3–14.9)
LYMPHOCYTES ABSOLUTE COUNT: 3.8 10*9/L — ABNORMAL HIGH (ref 1.1–3.6)
LYMPHOCYTES RELATIVE PERCENT: 26.7 %
MEAN CORPUSCULAR HEMOGLOBIN CONC: 33.2 g/dL (ref 32.0–36.0)
MEAN CORPUSCULAR HEMOGLOBIN: 29.9 pg (ref 25.9–32.4)
MEAN CORPUSCULAR VOLUME: 90.2 fL (ref 77.6–95.7)
MEAN PLATELET VOLUME: 7.3 fL (ref 6.8–10.7)
MONOCYTES ABSOLUTE COUNT: 0.9 10*9/L — ABNORMAL HIGH (ref 0.3–0.8)
MONOCYTES RELATIVE PERCENT: 6.4 %
NEUTROPHILS ABSOLUTE COUNT: 9.2 10*9/L — ABNORMAL HIGH (ref 1.8–7.8)
NEUTROPHILS RELATIVE PERCENT: 64.5 %
PLATELET COUNT: 340 10*9/L (ref 150–450)
RED BLOOD CELL COUNT: 4.35 10*12/L (ref 3.95–5.13)
RED CELL DISTRIBUTION WIDTH: 12.6 % (ref 12.2–15.2)
WBC ADJUSTED: 14.3 10*9/L — ABNORMAL HIGH (ref 3.6–11.2)

## 2022-04-27 ENCOUNTER — Ambulatory Visit: Admit: 2022-04-27 | Discharge: 2022-04-28 | Payer: PRIVATE HEALTH INSURANCE

## 2022-05-06 NOTE — Unmapped (Signed)
Pt notified of message below. Pt V/U

## 2022-05-06 NOTE — Unmapped (Signed)
Please contact patient to inform us showed the ovarian cyst has decreased in size  From previous study. Great news. The IUD remains in the appropriate position as well.  Please close encounter when complete  Thanks

## 2022-05-09 NOTE — Unmapped (Signed)
[facility] Refill Coordination Note    Specialty Medication(s) to be Shipped:   Inflammatory Disorders: Humira CF 80 mg/0.8 ml    Other medication(s) to be shipped: No additional medications requested for fill at this time     Teresa Bowman, DOB: [birth year]  Phone: [phone] (home)       All above HIPAA information was verified with patient.     Was a Nurse, learning disability used for this call? No    Completed refill call assessment today to schedule patient's medication shipment from the [facility] [phone]).  All relevant notes have been reviewed.     Specialty medication(s) and dose(s) confirmed: Regimen is correct and unchanged.   Changes to medications: Karena reports no changes at this time.  Changes to insurance: No  New side effects reported not previously addressed with a pharmacist or physician: None reported  Questions for the pharmacist: No    Confirmed patient received a Conservation officer, historic buildings and a Surveyor, mining with first shipment. The patient will receive a drug information handout for each medication shipped and additional FDA Medication Guides as required.       DISEASE/MEDICATION-SPECIFIC INFORMATION        For patients on injectable medications: Patient currently has 2 doses left.  Next injection is scheduled for 05/10/2022.    SPECIALTY MEDICATION ADHERENCE     Medication Adherence    Patient reported X missed doses in the last month: 0  Specialty Medication: Humira  Patient is on additional specialty medications: No  Any gaps in refill history greater than 2 weeks in the last 3 months: no  Demonstrates understanding of importance of adherence: yes  Informant: patient  Reliability of informant: reliable  Confirmed plan for next specialty medication refill: delivery by pharmacy  Refills needed for supportive medications: not needed        Were doses missed due to medication being on hold? No    REFERRAL TO PHARMACIST     Referral to the pharmacist: Not needed      [facility]     Shipping address confirmed in Epic.     Delivery Scheduled: Yes, Expected medication delivery date: 05/12/2022.     Medication will be delivered via UPS to the prescription address in Epic W[facility].    Lailyn Appelbaum D Caeden Foots   [facility] Specialty Technician

## 2022-05-11 MED FILL — HUMIRA(CF) PEN 80 MG/0.8 ML SUBCUTANEOUS KIT: SUBCUTANEOUS | 28 days supply | Qty: 4 | Fill #1

## 2022-05-30 MED ORDER — DULOXETINE 60 MG CAPSULE,DELAYED RELEASE
ORAL_CAPSULE | 2 refills | 0 days
Start: 2022-05-30 — End: ?

## 2022-05-30 MED ORDER — MONTELUKAST 10 MG TABLET
ORAL_TABLET | 11 refills | 0 days
Start: 2022-05-30 — End: ?

## 2022-05-31 MED ORDER — DULOXETINE 60 MG CAPSULE,DELAYED RELEASE
ORAL_CAPSULE | 2 refills | 0 days | Status: CP
Start: 2022-05-31 — End: ?

## 2022-05-31 MED ORDER — MONTELUKAST 10 MG TABLET
ORAL_TABLET | Freq: Every morning | ORAL | 2 refills | 90.00000 days | Status: CP
Start: 2022-05-31 — End: 2022-05-31

## 2022-05-31 NOTE — Unmapped (Signed)
Pharmacy Request for Medication Clarification    Name of medication: montelukast (SINGULAIR) 10 mg tablet   What needs to be clarified?: need prescription with the name of Supervisor/Attending provider due to insurance   Call pharmacy or send new rx  Return call to:   Name:   Company: [employer]   Phone number:  [phone]   PCP: Doran Heater, MD  Last encounter in department: 03/14/2022

## 2022-06-08 NOTE — Unmapped (Signed)
[facility] Special[facility] Pharm[facility] Refill Coordination Note    Specialty Medication(s) to be Shipped:   Inflammatory Disorders: Humira    Other medication(s) to be shipped: No additional medications requested for fill at this time     Teresa Bowman, DOB: 1980/11/30  Phone: [phone] (home)       All above HIPAA information was verified with patient.     Was a Nurse, learning disability used for this call? No    Completed refill call assessment today to schedule patient's medication shipment from the [facility] [phone]).  All relevant notes have been reviewed.     Specialty medication(s) and dose(s) confirmed: Regimen is correct and unchanged.   Changes to medications: Teresa Bowman reports no changes at this time.  Changes to insurance: No  New side effects reported not previously addressed with a pharmacist or physician: None reported  Questions for the pharmacist: No    Confirmed patient received a Conservation officer, historic buildings and a Surveyor, mining with first shipment. The patient will receive a drug information handout for each medication shipped and additional FDA Medication Guides as required.       DISEASE/MEDICATION-SPECIFIC INFORMATION        For patients on injectable medications: Patient currently has 1 doses left.  Next injection is scheduled for 06/14/22.    SPECIALTY MEDICATION ADHERENCE     Medication Adherence    Patient reported X missed doses in the last month: 0  Specialty Medication: Humira 80mg  /0.25ml  Patient is on additional specialty medications: No              Were doses missed due to medication being on hold? No        REFERRAL TO PHARMACIST     Referral to the pharmacist: Not needed      [facility]     Shipping address confirmed in Epic.     Delivery Scheduled: Yes, Expected medication delivery date: 06/15/22.     Medication will be delivered via UPS to the prescription address in Epic W[facility].    Quintella Reichert   [facility] Specialty Technician

## 2022-06-14 MED FILL — HUMIRA(CF) PEN 80 MG/0.8 ML SUBCUTANEOUS KIT: SUBCUTANEOUS | 28 days supply | Qty: 4 | Fill #2

## 2022-07-08 NOTE — Unmapped (Signed)
[facility] Special[facility] Pharmacy Refill Coordination Note    Specialty Medication(s) to be Shipped:   Inflammatory Disorders: Humira    Other medication(s) to be shipped: No additional medications requested for fill at this time     Teresa Bowman, DOB: [birth year]  Phone: [phone] (home)       All above HIPAA information was verified with patient.     Was a Nurse, learning disability used for this call? No    Completed refill call assessment today to schedule patient's medication shipment from the [facility] [phone]).  All relevant notes have been reviewed.     Specialty medication(s) and dose(s) confirmed: Regimen is correct and unchanged.   Changes to medications: Reed reports no changes at this time.  Changes to insurance: No  New side effects reported not previously addressed with a pharmacist or physician: None reported  Questions for the pharmacist: No    Confirmed patient received a Conservation officer, historic buildings and a Surveyor, mining with first shipment. The patient will receive a drug information handout for each medication shipped and additional FDA Medication Guides as required.       DISEASE/MEDICATION-SPECIFIC INFORMATION        For patients on injectable medications: Patient currently has 1 doses left.  Next injection is scheduled for 07/12/2022.    SPECIALTY MEDICATION ADHERENCE     Medication Adherence    Patient reported X missed doses in the last month: 0  Specialty Medication: Humira  Patient is on additional specialty medications: No  Any gaps in refill history greater than 2 weeks in the last 3 months: no  Demonstrates understanding of importance of adherence: yes  Informant: patient  Reliability of informant: reliable              Confirmed plan for next specialty medication refill: delivery by pharmacy  Refills needed for supportive medications: not needed              Were doses missed due to medication being on hold? No     Humira CF 80 mg/0.8 ml: 7 days on hand        REFERRAL TO PHARMACIST     Referral to the pharmacist: Not needed      [facility]     Shipping address confirmed in Epic.     Delivery Scheduled: Yes, Expected medication delivery date: 07/13/2022.     Medication will be delivered via UPS to the prescription address in Epic [facility].    Valere Dross   [facility] Specialty Technician

## 2022-07-12 MED FILL — HUMIRA(CF) PEN 80 MG/0.8 ML SUBCUTANEOUS KIT: SUBCUTANEOUS | 28 days supply | Qty: 4 | Fill #3

## 2022-08-04 NOTE — Unmapped (Signed)
[facility] Pharm[facility] Refill Coordination Note    Specialty Medication(s) to be Shipped:   Inflammatory Disorders: Humira    Other medication(s) to be shipped: No additional medications requested for fill at this time     Teresa Bowman, DOB: March 06, 1981  Phone: [phone] (home)       All above HIPAA information was verified with patient.     Was a Nurse, learning disability used for this call? No    Completed refill call assessment today to schedule patient's medication shipment from the [facility] [phone]).  All relevant notes have been reviewed.     Specialty medication(s) and dose(s) confirmed: Regimen is correct and unchanged.   Changes to medications: Aldea reports no changes at this time.  Changes to insurance: No  New side effects reported not previously addressed with a pharmacist or physician: None reported  Questions for the pharmacist: No    Confirmed patient received a Conservation officer, historic buildings and a Surveyor, mining with first shipment. The patient will receive a drug information handout for each medication shipped and additional FDA Medication Guides as required.       DISEASE/MEDICATION-SPECIFIC INFORMATION        For patients on injectable medications: Patient currently has 1 doses left.  Next injection is scheduled for 08/09/2022.    SPECIALTY MEDICATION ADHERENCE     Medication Adherence    Patient reported X missed doses in the last month: 0  Specialty Medication: Humira  Patient is on additional specialty medications: No  Any gaps in refill history greater than 2 weeks in the last 3 months: no  Demonstrates understanding of importance of adherence: yes  Informant: patient  Reliability of informant: reliable              Confirmed plan for next specialty medication refill: delivery by pharmacy  Refills needed for supportive medications: not needed              Were doses missed due to medication being on hold? No     Humira CF 80 mg/0.8 ml: 7 days on hand        REFERRAL TO PHARMACIST     Referral to the pharmacist: Not needed      [facility]     Shipping address confirmed in Epic.     Delivery Scheduled: Yes, Expected medication delivery date: 08/11/2022.     Medication will be delivered via UPS to the prescription address in Epic [facility].    Valere Dross   [facility] Specialty Technician

## 2022-08-10 MED FILL — HUMIRA(CF) PEN 80 MG/0.8 ML SUBCUTANEOUS KIT: SUBCUTANEOUS | 28 days supply | Qty: 4 | Fill #4

## 2022-08-22 DIAGNOSIS — L732 Hidradenitis suppurativa: Principal | ICD-10-CM

## 2022-08-22 MED ORDER — SPIRONOLACTONE 100 MG TABLET
ORAL_TABLET | Freq: Every day | ORAL | 3 refills | 90.00000 days | Status: CP
Start: 2022-08-22 — End: ?

## 2022-09-06 NOTE — Unmapped (Signed)
[facility] Refill Coordination Note    Specialty Medication(s) to be Shipped:   Inflammatory Disorders: Humira    Other medication(s) to be shipped: No additional medications requested for fill at this time     Teresa Bowman, DOB: [birth year]  Phone: [phone] (home)       All above HIPAA information was verified with patient.     Was a Nurse, learning disability used for this call? No    Completed refill call assessment today to schedule patient's medication shipment from the [facility] [phone]).  All relevant notes have been reviewed.     Specialty medication(s) and dose(s) confirmed: Regimen is correct and unchanged.   Changes to medications: Lucinda reports no changes at this time.  Changes to insurance: No  New side effects reported not previously addressed with a pharmacist or physician: None reported  Questions for the pharmacist: No    Confirmed patient received a Conservation officer, historic buildings and a Surveyor, mining with first shipment. The patient will receive a drug information handout for each medication shipped and additional FDA Medication Guides as required.       DISEASE/MEDICATION-SPECIFIC INFORMATION        For patients on injectable medications: Patient currently has 1 doses left.  Next injection is scheduled for 10/17.    SPECIALTY MEDICATION ADHERENCE     Medication Adherence    Patient reported X missed doses in the last month: 0  Specialty Medication: Humira CF 80 mg/0.8 ml  Patient is on additional specialty medications: No  Any gaps in refill history greater than 2 weeks in the last 3 months: no  Demonstrates understanding of importance of adherence: yes  Informant: patient  Reliability of informant: reliable              Confirmed plan for next specialty medication refill: delivery by pharmacy  Refills needed for supportive medications: not needed              Were doses missed due to medication being on hold? No     Humira CF 80 mg/0.8 ml: 7 days on hand        REFERRAL TO PHARMACIST     Referral to the pharmacist: Not needed      [facility]     Shipping address confirmed in Epic.     Delivery Scheduled: Yes, Expected medication delivery date: 10/19.     Medication will be delivered via UPS to the prescription address in Epic WAM.    Valere Dross   [facility] Specialty Technician

## 2022-09-07 MED FILL — HUMIRA(CF) PEN 80 MG/0.8 ML SUBCUTANEOUS KIT: SUBCUTANEOUS | 28 days supply | Qty: 4 | Fill #5

## 2022-09-19 DIAGNOSIS — R928 Other abnormal and inconclusive findings on diagnostic imaging of breast: Principal | ICD-10-CM

## 2022-09-19 NOTE — Unmapped (Signed)
Received message from radiology requesting order for follow up Mammogram for abnormal mammogram last year. Pt seen and evaluated by surgical oncology and biopsy performed 12.2022 Orders placed

## 2022-09-30 NOTE — Unmapped (Signed)
[facility] Refill Coordination Note    Specialty Medication(s) to be Shipped:   Inflammatory Disorders: Humira    Other medication(s) to be shipped: No additional medications requested for fill at this time     Teresa Bowman, DOB: 15-Oct-1981  Phone: [phone] (home)       All above HIPAA information was verified with patient.     Was a Nurse, learning disability used for this call? No    Completed refill call assessment today to schedule patient's medication shipment from the [facility] [phone]).  All relevant notes have been reviewed.     Specialty medication(s) and dose(s) confirmed: Regimen is correct and unchanged.   Changes to medications: Nickie reports no changes at this time.  Changes to insurance: No  New side effects reported not previously addressed with a pharmacist or physician: None reported  Questions for the pharmacist: No    Confirmed patient received a Conservation officer, historic buildings and a Surveyor, mining with first shipment. The patient will receive a drug information handout for each medication shipped and additional FDA Medication Guides as required.       DISEASE/MEDICATION-SPECIFIC INFORMATION        For patients on injectable medications: Patient currently has 1 doses left.  Next injection is scheduled for 11/14.    SPECIALTY MEDICATION ADHERENCE     Medication Adherence    Patient reported X missed doses in the last month: 0  Specialty Medication: Humira  Patient is on additional specialty medications: No  Any gaps in refill history greater than 2 weeks in the last 3 months: no  Demonstrates understanding of importance of adherence: yes  Informant: patient  Reliability of informant: reliable              Confirmed plan for next specialty medication refill: delivery by pharmacy  Refills needed for supportive medications: not needed              Were doses missed due to medication being on hold? No     Humira CF 80 mg/0.8 ml: 7 days on hand        REFERRAL TO PHARMACIST     Referral to the pharmacist: Not needed      [facility]     Shipping address confirmed in Epic.     Delivery Scheduled: Yes, Expected medication delivery date: 11/14.     Medication will be delivered via UPS to the prescription address in Epic WAM.    Valere Dross   [facility] Specialty Technician

## 2022-10-03 MED FILL — HUMIRA(CF) PEN 80 MG/0.8 ML SUBCUTANEOUS KIT: SUBCUTANEOUS | 28 days supply | Qty: 4 | Fill #6

## 2022-10-28 NOTE — Unmapped (Signed)
[facility] Refill Coordination Note    Patient had a misfire on one of her pens.    Specialty Medication(s) to be Shipped:   Inflammatory Disorders: Humira    Other medication(s) to be shipped: No additional medications requested for fill at this time     Teresa Bowman, DOB: 10/10/1981  Phone: [phone] (home)       All above HIPAA information was verified with patient.     Was a Nurse, learning disability used for this call? No    Completed refill call assessment today to schedule patient's medication shipment from the [facility] [phone]).  All relevant notes have been reviewed.     Specialty medication(s) and dose(s) confirmed: Regimen is correct and unchanged.   Changes to medications: Teresa Bowman reports no changes at this time.  Changes to insurance: No  New side effects reported not previously addressed with a pharmacist or physician: None reported  Questions for the pharmacist: No    Confirmed patient received a Conservation officer, historic buildings and a Surveyor, mining with first shipment. The patient will receive a drug information handout for each medication shipped and additional FDA Medication Guides as required.       DISEASE/MEDICATION-SPECIFIC INFORMATION        For patients on injectable medications: Patient currently has 0 doses left.  Next injection is scheduled for 12/12.    SPECIALTY MEDICATION ADHERENCE     Medication Adherence    Patient reported X missed doses in the last month: 0  Specialty Medication: Humira  Patient is on additional specialty medications: No  Any gaps in refill history greater than 2 weeks in the last 3 months: no  Demonstrates understanding of importance of adherence: yes  Informant: patient  Reliability of informant: reliable              Confirmed plan for next specialty medication refill: delivery by [facility]  Refills needed for supportive medications: not needed              Were doses missed due to medication being on hold? No     Humira CF 80 mg/0.8 ml: 0 days on hand        REFERRAL TO PHARMACIST     Referral to the pharmacist: Not needed      [facility]     Shipping address confirmed in Epic.     Delivery Scheduled: Yes, Expected medication delivery date: 12/12.     Medication will be delivered via UPS to the prescription address in Epic WAM.    Teresa Bowman   [place] [facility] Specialty Technician

## 2022-10-31 MED FILL — HUMIRA(CF) PEN 80 MG/0.8 ML SUBCUTANEOUS KIT: SUBCUTANEOUS | 28 days supply | Qty: 4 | Fill #7

## 2022-11-11 DIAGNOSIS — L732 Hidradenitis suppurativa: Principal | ICD-10-CM

## 2022-11-11 MED ORDER — CLINDAMYCIN PHOSPHATE 1 % TOPICAL SOLUTION
11 refills | 0 days
Start: 2022-11-11 — End: ?

## 2022-11-15 MED ORDER — CLINDAMYCIN PHOSPHATE 1 % TOPICAL SOLUTION
11 refills | 0 days | Status: CP
Start: 2022-11-15 — End: ?

## 2022-11-15 NOTE — Unmapped (Signed)
Rx request Clindamycin 1% external sol  Last OV:04/11/2022  Rx pending for your review and  approval

## 2022-11-18 NOTE — Unmapped (Signed)
[facility] Refill Coordination Note    Patient had a misfire on one of her pens.    Specialty Medication(s) to be Shipped:   Inflammatory Disorders: Humira    Other medication(s) to be shipped: No additional medications requested for fill at this time     Teresa Bowman, DOB: 09-19-1981  Phone: [phone] (home)       All above HIPAA information was verified with patient.     Was a Nurse, learning disability used for this call? No    Completed refill call assessment today to schedule patient's medication shipment from the [facility] [phone]).  All relevant notes have been reviewed.     Specialty medication(s) and dose(s) confirmed: Regimen is correct and unchanged.   Changes to medications: Kristeena reports no changes at this time.  Changes to insurance: No  New side effects reported not previously addressed with a pharmacist or physician: None reported  Questions for the pharmacist: No    Confirmed patient received a Conservation officer, historic buildings and a Surveyor, mining with first shipment. The patient will receive a drug information handout for each medication shipped and additional FDA Medication Guides as required.       DISEASE/MEDICATION-SPECIFIC INFORMATION        For patients on injectable medications: Patient currently has 1 doses left.  Next injection is scheduled for 1/2.    SPECIALTY MEDICATION ADHERENCE     Medication Adherence    Patient reported X missed doses in the last month: 0  Specialty Medication: Humira  Patient is on additional specialty medications: No  Any gaps in refill history greater than 2 weeks in the last 3 months: no  Demonstrates understanding of importance of adherence: yes  Informant: patient  Reliability of informant: reliable              Confirmed plan for next specialty medication refill: delivery by pharmacy  Refills needed for supportive medications: not needed              Were doses missed due to medication being on hold? No     Humira CF 80 mg/0.8 ml: 7 days on hand        REFERRAL TO PHARMACIST     Referral to the pharmacist: Not needed      [facility]     Shipping address confirmed in Epic.     Delivery Scheduled: Yes, Expected medication delivery date: 1/4.     Medication will be delivered via UPS to the prescription address in Epic WAM.    Valere Dross   [facility] Specialty Technician

## 2022-11-23 MED FILL — HUMIRA(CF) PEN 80 MG/0.8 ML SUBCUTANEOUS KIT: SUBCUTANEOUS | 28 days supply | Qty: 4 | Fill #8

## 2022-11-25 DIAGNOSIS — L732 Hidradenitis suppurativa: Principal | ICD-10-CM

## 2022-12-12 ENCOUNTER — Ambulatory Visit
Admit: 2022-12-12 | Discharge: 2022-12-13 | Payer: PRIVATE HEALTH INSURANCE | Attending: Student in an Organized Health Care Education/Training Program | Primary: Student in an Organized Health Care Education/Training Program

## 2022-12-12 MED ORDER — TERBINAFINE HCL 1 % TOPICAL CREAM
Freq: Two times a day (BID) | TOPICAL | 0 refills | 0.00000 days | Status: CN
Start: 2022-12-12 — End: 2023-12-12

## 2022-12-12 MED ORDER — MICONAZOLE NITRATE 2 % TOPICAL CREAM
Freq: Two times a day (BID) | TOPICAL | 3 refills | 0.00000 days | Status: CP
Start: 2022-12-12 — End: 2023-12-12

## 2022-12-12 MED ORDER — ITRACONAZOLE 100 MG CAPSULE
ORAL_CAPSULE | Freq: Two times a day (BID) | ORAL | 0 refills | 14.00000 days | Status: CN
Start: 2022-12-12 — End: 2022-12-26

## 2022-12-12 NOTE — Unmapped (Signed)
Dermatology Note     Assessment and Plan:      Hidradenitis Suppurativa, [place] stage II, did not discuss today (has an appointment with Dr. Janyth Contes in April)  - Discussed chronic, relapsing nature of disease and treatment options, including antibiotics, immune modulators, surgery, and laser.  - she has been well-controlled on Humira 40 mg weekly but has been flaring more intermittently recently, discussed options and jointly elected to increase humira to 80 mg weekly, sent to [facility]   - Patient prefers to avoid oral antibiotics for now due to history of yeast infections  - Discussed potential surgeries if not improved in the future  - Continue spironolactone 100 mg daily   - After discussion of r/b/a, jointly elected to treat with ILK (see procedure note below) to flaring lesions  - Continue triamcinolone 0.1% cream BID to affected area for up to 1 week at a time as needed for flares.   Appropriate use and side effects of topical steroids discussed.  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   - Recommended keeping the area dry and clean  - Patient used Nystatin ointment daily for 4 days and stopped due to burning  - Start OTC barrier cream (Desitin or Zinc Oxide) and mix with Vaseline  - Start miconazole (MICOTIN) 2 % cream; Apply topically two (2) times a day.   - Advised that if this is not covered by insurance can continue Nystatin mixed with Vaseline BID  - Continue fluconazole   - PCP prescribed fluconazole 200mg  daily for 7 days, has taken 3 doses  - Recommend pulse dosing with 200mg  weekly for 4 weeks  - Fungal ([place]ould) Pathogen Culture ordered  - HSV PCR ordered  - Varicella (VZV) PCR, Other ordered    The patient was advised to call for an appointment should any new, changing, or symptomatic lesions develop.     RTC: Return in about 3 weeks (around 01/02/2023) for intertrigo. or sooner as needed   _________________________________________________________________      Chief Complaint     Chief Complaint   Patient presents with    Rash     Rash under belly and in groin area x 2 weeks. She has used some cream (anti fugal x 3 days, Steroid cream. No improvement. Rash is worsening. Swollen, blisters, red, itchy, oozing, and painful.        HPI     Teresa Bowman is a 42 y.o. female who presents as a returning patient (last seen 04/11/2022) to Dermatology for a rash.     Today patient reports:  - rash arose 2 weeks ago  - started with a couple of red spots  - tried OTC antifungal, triamcinolone and clobetasol - temporarily helpful  - endorses pruritus  - has never had a rash like this previously  - denies fevers/chills  - has not been on antibiotics recently  - patient has Type II diabetes  - used topical Nystatin around 1 week ago for 3-4 days, stopped because it caused burning  - PCP prescribed fluconazole 200mg  daily for 7 days, has taken 3, which were ineffective     Has an appointment with Dr. Janyth Contes in April for HS.    The patient denies any other new or  changing lesions or areas of concern.     Pertinent Past Medical History     Past Medical History, Family History, Social History, Medication List, Allergies, and Problem List were reviewed in the rooming section of Epic.     ROS: Other than symptoms mentioned in the HPI, no fevers, chills, or other skin complaints    Physical Examination     GENERAL: Well-appearing female in no acute distress, resting comfortably.  NEURO: Alert and oriented, answers questions appropriately  SKIN: Examination of the abdomen, bilateral lower extremities, and groin was performed    - erythematous plaques with maceration and satellite inflammatory papules and pustules on the infrapannus and inguinal creases  - punched out erosions on infrapannus All areas not commented on are within normal limits or unremarkable    (Approved Template 08/03/2020)

## 2022-12-13 NOTE — Unmapped (Addendum)
Start over the counter Desitin barrier cream (can mix with Vaseline to help with burning)  Continue Nystatin twice a day (can mix with Vaseline to help with burning)  Take fluconazole 200mg  weekly for 3 more weeks

## 2022-12-14 ENCOUNTER — Other Ambulatory Visit: Payer: Self-pay

## 2022-12-14 ENCOUNTER — Emergency Department
Admission: EM | Admit: 2022-12-14 | Discharge: 2022-12-14 | Disposition: A | Discharge status: Home / Self Care | Payer: Medicaid Other | Attending: Emergency Medicine | Admitting: Emergency Medicine

## 2022-12-14 DIAGNOSIS — T783XXA Angioneurotic edema, initial encounter: Secondary | ICD-10-CM | POA: Diagnosis not present

## 2022-12-14 DIAGNOSIS — R220 Localized swelling, mass and lump, head: Secondary | ICD-10-CM | POA: Diagnosis present

## 2022-12-14 MED ORDER — PREDNISONE 20 MG PO TABS: 40.0000 mg | ORAL_TABLET | Freq: Every day | ORAL | 0 refills | Status: AC

## 2022-12-14 MED ORDER — EPINEPHRINE 0.3 MG/0.3ML IJ SOAJ
0.3000 mg | Freq: Once | INTRAMUSCULAR | Status: AC
  Administered 2022-12-14: 0.3 mg via INTRAMUSCULAR
  Filled 2022-12-14: qty 0.3

## 2022-12-14 MED ORDER — FAMOTIDINE IN NACL 20-0.9 MG/50ML-% IV SOLN
20.0000 mg | Freq: Once | INTRAVENOUS | Status: AC
  Administered 2022-12-14: 20 mg via INTRAVENOUS
  Filled 2022-12-14: qty 50

## 2022-12-14 MED ORDER — DIPHENHYDRAMINE HCL 50 MG/ML IJ SOLN
25.0000 mg | Freq: Once | INTRAMUSCULAR | Status: AC
  Administered 2022-12-14: 25 mg via INTRAVENOUS
  Filled 2022-12-14: qty 1

## 2022-12-14 MED ORDER — EPINEPHRINE 0.3 MG/0.3ML IJ SOAJ: 0.3000 mg | INTRAMUSCULAR | 1 refills | Status: AC | PRN

## 2022-12-14 MED ORDER — METHYLPREDNISOLONE SODIUM SUCC 125 MG IJ SOLR
125.0000 mg | Freq: Once | INTRAMUSCULAR | Status: AC
  Administered 2022-12-14: 125 mg via INTRAVENOUS
  Filled 2022-12-14: qty 2

## 2022-12-14 NOTE — ED Provider Notes (Signed)
[facility] Provider Note    Event Date/Time   First MD Initiated Contact with Patient 12/14/22 0515     (approximate)   History   Allergic Reaction   HPI  Jenny Diaz is a 42 y.o. female with a history of arthritis and insomnia who presents with lip swelling and throat discomfort, acute onset a few hours ago.  The patient states that she woke up with her lip swollen around 4 AM.  She feels a scratchy sensation in her throat but does not feel like it is tight or closing.  She has no shortness of breath, rash, hives or itching.  She reports taking fluconazole for a rash on her torso as well as a probiotic.  She denies any other allergic exposures.  She is not on any blood pressure medication.  She has no prior history of a similar reaction.  I reviewed the past medical records.  The patient's most recent outpatient encounter was on 1/22 with dermatology for intertrigo, with a rash noted under her abdomen and groin area for few weeks.  She was started on a barrier cream, miconazole and instructed to continue fluconazole.   Physical Exam   Triage Vital Signs: ED Triage Vitals [12/14/22 0456]  Enc Vitals Group     BP (!) 111/90     Pulse Rate (!) 102     Resp 20     Temp 98.7 F (37.1 C)     Temp Source Oral     SpO2 99 %     Weight      Height      Head Circumference      Peak Flow      Pain Score      Pain Loc      Pain Edu?      Excl. in [place]?     Most recent vital signs: Vitals:   12/14/22 0456 12/14/22 0559  BP: (!) 111/90 107/62  Pulse: (!) 102 87  Resp: 20 13  Temp: 98.7 F (37.1 C)   SpO2: 99% 97%     General: Awake, no distress.  CV:  Good peripheral perfusion.  Resp:  Normal effort.  Lungs CTAB. Abd:  No distention.  Other:  Mild lower lip swelling.  Oropharynx clear with no swelling, pooled secretions, or stridor.  No urticaria or other rash.   ED Results / Procedures / Treatments   Labs (all labs ordered are listed, but  only abnormal results are displayed) Labs Reviewed - No data to display   EKG     RADIOLOGY    PROCEDURES:  Critical Care performed: No  Procedures   MEDICATIONS ORDERED IN ED: Medications  diphenhydrAMINE (BENADRYL) injection 25 mg (25 mg Intravenous Given 12/14/22 0552)  famotidine (PEPCID) IVPB 20 mg premix (0 mg Intravenous Stopped 12/14/22 0625)  methylPREDNISolone sodium succinate (SOLU-MEDROL) 125 mg/2 mL injection 125 mg (125 mg Intravenous Given 12/14/22 0552)  EPINEPHrine (EPI-PEN) injection 0.3 mg (0.3 mg Intramuscular Given 12/14/22 0551)     IMPRESSION / MDM / [place] / ED COURSE  I reviewed the triage vital signs and the nursing notes.  42 year old female with PMH as noted above presents with lip swelling and discomfort in her throat.  Differential diagnosis includes, but is not limited to, angioedema, possibly allergic versus hereditary.  The patient is not on an ACE inhibitor or ARB.  We will give epinephrine, Benadryl, Solu-Medrol, and Pepcid and reassess.  Patient's presentation is most consistent  with acute presentation with potential threat to life or bodily function.  ----------------------------------------- 7:30 AM on 12/14/2022 -----------------------------------------  The patient reports significant improvement.  Her throat feels back to normal and the lip swelling seems slightly better.  She has been in the ED for about 3 hours.  Will observe for for least an additional 1 to 2 hours.  I did consider whether the patient may require admission, however given that the angioedema is relatively mild and is improving I think she likely will be able to go home.  I have signed her out to the oncoming ED physician Dr. Archie Balboa.   FINAL CLINICAL IMPRESSION(S) / ED DIAGNOSES   Final diagnoses:  Angioedema, initial encounter     Rx / DC Orders   ED Discharge Orders     None        Note:  This document was prepared using Dragon voice  recognition software and may include unintentional dictation errors.    Arta Silence, MD 12/14/22 [phone]

## 2022-12-14 NOTE — ED Triage Notes (Addendum)
Pt comes from home via POV c/o allergic reaction. Pt states she woke up with lip swelling. Pt reports not eating/ taking anything prior. Pt reports throat feeling scratchy but denies feeling like throat is closing. Denies any SOB, airway intact, speaking in full sentences. NAD at this time.

## 2022-12-14 NOTE — Discharge Instructions (Addendum)
Please seek medical attention for any high fevers, chest pain, shortness of breath, change in behavior, persistent vomiting, bloody stool or any other new or concerning symptoms.  

## 2022-12-14 NOTE — ED Notes (Signed)
Pt ambulatory with steady gait to bathroom

## 2022-12-14 NOTE — ED Provider Notes (Signed)
Patient's swelling continued to improve. Discussed return precautions with the patient.    Nance Pear, MD 12/14/22 [phone]

## 2022-12-19 NOTE — Unmapped (Signed)
[facility] Refill Coordination Note    Specialty Medication(s) to be Shipped:   Inflammatory Disorders: Humira    Other medication(s) to be shipped: No additional medications requested for fill at this time     Teresa Bowman, DOB: January 24, 1981  Phone: [phone] (home)       All above HIPAA information was verified with patient.     Was a Nurse, learning disability used for this call? No    Completed refill call assessment today to schedule patient's medication shipment from the [facility] [phone]).  All relevant notes have been reviewed.     Specialty medication(s) and dose(s) confirmed: Regimen is correct and unchanged.   Changes to medications: Teresa Bowman reports no changes at this time.  Changes to insurance: No  New side effects reported not previously addressed with a pharmacist or physician: None reported  Questions for the pharmacist: No    Confirmed patient received a Conservation officer, historic buildings and a Surveyor, mining with first shipment. The patient will receive a drug information handout for each medication shipped and additional FDA Medication Guides as required.       DISEASE/MEDICATION-SPECIFIC INFORMATION        For patients on injectable medications: Patient currently has 1 doses left.  Next injection is scheduled for 12/20/2022.    SPECIALTY MEDICATION ADHERENCE     Medication Adherence    Patient reported X missed doses in the last month: 0  Specialty Medication: HUMIRA(CF) PEN 80 mg/0.8 mL  Patient is on additional specialty medications: No  Informant: patient                                Were doses missed due to medication being on hold? No    Humira 80/0.8 mg/ml: 1 days of medicine on hand       REFERRAL TO PHARMACIST     Referral to the pharmacist: Not needed      [facility]     Shipping address confirmed in Epic.     Delivery Scheduled: Yes, Expected medication delivery date: 12/23/2022.     Medication will be delivered via UPS to the prescription address in Epic WAM.    [name]   [facility] Specialty Technician

## 2022-12-22 MED FILL — HUMIRA(CF) PEN 80 MG/0.8 ML SUBCUTANEOUS KIT: SUBCUTANEOUS | 28 days supply | Qty: 4 | Fill #9

## 2023-01-02 DIAGNOSIS — L732 Hidradenitis suppurativa: Principal | ICD-10-CM

## 2023-01-02 MED ORDER — DOXYCYCLINE HYCLATE 100 MG CAPSULE
ORAL_CAPSULE | 4 refills | 0.00000 days
Start: 2023-01-02 — End: ?

## 2023-01-03 MED ORDER — DOXYCYCLINE HYCLATE 100 MG CAPSULE
ORAL_CAPSULE | 4 refills | 0.00000 days
Start: 2023-01-03 — End: ?

## 2023-01-03 NOTE — Unmapped (Signed)
Refill for doxycycline, please advise

## 2023-01-03 NOTE — Unmapped (Signed)
We are going to hold off on refilling this for now. We are worried that the antibiotic could exacerbate a likely yeast infection. She has an appointment with Dr. Bennie Hind on 01/12/23 and we will re-evaluate at this time.

## 2023-01-12 NOTE — Unmapped (Signed)
[facility] Refill Coordination Note    Specialty Medication(s) to be Shipped:   Inflammatory Disorders: Humira    Other medication(s) to be shipped: No additional medications requested for fill at this time     Teresa Bowman, DOB: 10-06-1981  Phone: [phone] (home)       All above HIPAA information was verified with patient.     Was a Nurse, learning disability used for this call? No    Completed refill call assessment today to schedule patient's medication shipment from the [facility] [phone]).  All relevant notes have been reviewed.     Specialty medication(s) and dose(s) confirmed: Regimen is correct and unchanged.   Changes to medications: Teresa Bowman reports no changes at this time.  Changes to insurance: No  New side effects reported not previously addressed with a pharmacist or physician: None reported  Questions for the pharmacist: No    Confirmed patient received a Conservation officer, historic buildings and a Surveyor, mining with first shipment. The patient will receive a drug information handout for each medication shipped and additional FDA Medication Guides as required.       DISEASE/MEDICATION-SPECIFIC INFORMATION        For patients on injectable medications: Patient currently has 1 doses left.  Next injection is scheduled for 2/27.    SPECIALTY MEDICATION ADHERENCE     Medication Adherence    Patient reported X missed doses in the last month: 0  Specialty Medication: Humira  Patient is on additional specialty medications: No  Informant: patient              Were doses missed due to medication being on hold? No    Humira  80/0.8 mg/ml: 5 days of medicine on hand       REFERRAL TO PHARMACIST     Referral to the pharmacist: Not needed      [facility]     Shipping address confirmed in Epic.     Patient was notified of new phone menu : Yes    Delivery Scheduled: Yes, Expected medication delivery date: 3/1.     Medication will be delivered via UPS to the prescription address in Epic WAM.    Teresa Bowman   [facility] Specialty Technician

## 2023-01-20 MED FILL — HUMIRA(CF) PEN 80 MG/0.8 ML SUBCUTANEOUS KIT: SUBCUTANEOUS | 28 days supply | Qty: 4 | Fill #10

## 2023-01-26 DIAGNOSIS — L732 Hidradenitis suppurativa: Principal | ICD-10-CM

## 2023-01-26 MED ORDER — DOXYCYCLINE HYCLATE 100 MG CAPSULE
ORAL_CAPSULE | 4 refills | 0.00000 days
Start: 2023-01-26 — End: ?

## 2023-01-26 NOTE — Unmapped (Signed)
Pt requested medication refill for Doxycycline  LOV:12/12/22  Pt has an Appointment with Dr Janyth Contes  On 01/31/2023

## 2023-01-27 MED ORDER — DOXYCYCLINE HYCLATE 100 MG CAPSULE
ORAL_CAPSULE | 4 refills | 0.00000 days | Status: CP
Start: 2023-01-27 — End: ?

## 2023-01-30 DIAGNOSIS — L732 Hidradenitis suppurativa: Principal | ICD-10-CM

## 2023-01-31 ENCOUNTER — Ambulatory Visit: Admit: 2023-01-31 | Discharge: 2023-02-01 | Payer: PRIVATE HEALTH INSURANCE

## 2023-01-31 DIAGNOSIS — L732 Hidradenitis suppurativa: Principal | ICD-10-CM

## 2023-01-31 DIAGNOSIS — Z79899 Other long term (current) drug therapy: Principal | ICD-10-CM

## 2023-01-31 MED ORDER — SPIRONOLACTONE 100 MG TABLET
ORAL_TABLET | Freq: Every day | ORAL | 3 refills | 90.00000 days | Status: CP
Start: 2023-01-31 — End: ?

## 2023-01-31 MED ORDER — CLINDAMYCIN 1 % LOTION
Freq: Two times a day (BID) | TOPICAL | 11 refills | 0.00000 days | Status: CP
Start: 2023-01-31 — End: 2024-01-31

## 2023-01-31 MED ORDER — COSENTYX UNOREADY PEN 300 MG/2 ML (150 MG/ML) SUBCUTANEOUS
SUBCUTANEOUS | 10 refills | 0.00000 days | Status: CP
Start: 2023-01-31 — End: ?
  Filled 2023-02-15: qty 8, 28d supply, fill #0

## 2023-01-31 NOTE — Unmapped (Signed)
Dermatology Note     Assessment and Plan:      Hidradenitis Suppurativa, Hurley stage II - chronic, flaring today on vulva  Failed adalimumab  - Discussed chronic, relapsing nature of disease and treatment options, including antibiotics, immune modulators, surgery, and laser.  - Patient prefers to avoid oral antibiotics for now due to history of yeast infections  - discussed option of continuing adalimumab vs infliximab (infection, immunosuppression, avoid giving with active malignancy) vs secukinumab (infection, immunosuppression, safer than TNFi, better long-term efficacy). Cannot predict whether a different class of medication will be more or less effective. Jointly decided to switch adalimumab to secukinumab. Will continue on adalimumab 80 mg weekly until secukinumab is received  - START secukinumab (COSENTYX UNOREADY PEN) 300 mg/2 mL (150 mg/mL) PnIj; Inject under the skin: 300 mg at weeks 0, 1, 2, 3, and 4  - START secukinumab (COSENTYX UNOREADY PEN) 300 mg/2 mL (150 mg/mL) PnIj; Inject 300 mg under the skin every twenty-eight (28) days.  - Continue spironolactone 100 mg daily, R/A/B reviewed  - Continue triamcinolone 0.1% cream BID to affected area for up to 1 week at a time as needed for flares.   Appropriate use and side effects of topical steroids discussed.  - switch from wipes/solution to lotion: start clindamycin (CLEOCIN T) 1 % lotion; Apply topically two (2) times a day. Apply to HS as needed until it clears  - patient stopped metformin   - continue doxycycline for flares  - ILK on flaring lesion as below  - Discussed potential surgeries if not improved in the future    Intralesional Kenalog Procedure Note: After the patient was informed of risks (including atrophy and dyspigmentation), benefits and side effects of intralesional steroid injection, the patient elected to undergo injection and verbal consent was obtained. Skin was cleaned with alcohol and injected intralesionally into the sites (below). The patient tolerated the procedure well without complications and was instructed on post-procedure care.  Location(s): left mons  Number of sites treated: 1  Kenalog (triamcinolone) Concentration: 40 mg/ml   Volume: 0.1 ml total    High risk medication use (adalimumab switching to secukinumab)  - Quantiferon TB Gold Plus    The patient was advised to call for an appointment should any new, changing, or symptomatic lesions develop.     RTC: Return in about 4 months (around 06/02/2023). or sooner as needed   _________________________________________________________________      Chief Complaint     Chief Complaint   Patient presents with    HS     Pt here for follow up HS        HPI     Teresa Bowman. Tackman is a 42 y.o. female who presents as a returning patient (last seen 12/12/2022) to Dermatology for follow up of HS. HS has improved on higher dose of adalimumab but is still active. Patient is concerned about developing resistance. Has a flare in left groin x 1-2 weeks that has not responded to topicals. Stopped metformin and switched to mounjaro. Taking spironolactone with no side effects. Using clindamycin, prefers lotion. Using triamcinolone with some benefit.     The patient denies any other new or changing lesions or areas of concern.     Pertinent Past Medical History     Past Medical History, Family History, Social History, Medication List, Allergies, and Problem List were reviewed in the rooming section of Epic.     ROS: Other than symptoms mentioned in the HPI, no fevers, chills,  or other skin complaints    Physical Examination     GENERAL: Well-appearing female in no acute distress, resting comfortably.  NEURO: Alert and oriented, answers questions appropriately  SKIN (Focal Skin Exam): Per patient request, examination of inguinal creases and mons pubis was performed    Linear scar on left mons  Inflamed nodule on left mons  Postinflammatory changes on infrapannus    All areas not commented on are within normal limits or unremarkable      (Approved Template 08/03/2020)

## 2023-01-31 NOTE — Unmapped (Signed)
Self-reported severity (0-5): 1  VAS pain today: 7  VAS average pain for the last month: 4  Requiring pain medication?  yes   If so, what type/frequency? -  How often in pain?  continuously  Level of odor (0-5): 2  Level of itching (0-5): 2  Dressing changes needed for drainage:Once a day  How much drainage: a little drainage  Flare in the last month (Y/N)? No.  How long ago was the last flare? in the last year  Developing new lesions? less than monthly  Number of inflammatory lesions montly: 1-3  DLQI: 12  Current treatment: humira     How helpful is the current treatment in managing the following aspects of your disease?  Not at all helpful Somewhat helpful Very helpful   Pain   x   Decreasing length of flares   x   Decreasing new lesions   x   Drainage   x   Decreasing frequency of flares   x   Decreasing severity of flares   x   Odor   x

## 2023-02-01 MED ADMIN — triamcinolone acetonide (KENALOG-40) injection 40 mg: 40 mg | INTRALESIONAL | @ 03:00:00 | Stop: 2023-01-31

## 2023-02-01 NOTE — Unmapped (Signed)
PA INITIATED FOR Clindamycin Phosphate 1% lotion Key: [id number]  PA Case ID #: [id number]  Rx #: [id number]  SENT TO PLAN TODAY

## 2023-02-01 NOTE — Unmapped (Signed)
I saw and evaluated the patient, participating in the key elements of the service.  I discussed the findings, assessment and plan with the resident and agree with resident’s findings and plan as documented in the resident's note.  I was immediately available for the entirety of the procedure(s) and present for the key and critical portions. Nevae Pinnix J Ersa Delaney, MD

## 2023-02-06 DIAGNOSIS — Z79899 Other long term (current) drug therapy: Principal | ICD-10-CM

## 2023-02-06 DIAGNOSIS — L732 Hidradenitis suppurativa: Principal | ICD-10-CM

## 2023-02-07 NOTE — Unmapped (Signed)
[facility] Special[facility] Medication Onboarding    Specialty Medication: COSENTYX UNOREADY PEN 300 mg/2 mL (150 mg/mL) Pnij (secukinumab)  Prior Authorization: Approved   Financial Assistance: No - copay  <$25  Final Copay/Day Supply: $4 / 35 days (LD)          $4 / 28 days (MD)    Insurance Restrictions: None     Notes to Pharmacist:     The triage team has completed the benefits investigation and has determined that the patient is able to fill this medication at [facility]. Please contact the patient to complete the onboarding or follow up with the prescribing physician as needed.

## 2023-02-07 NOTE — Unmapped (Signed)
[facility] Refill Coordination Note    Specialty Medication(s) to be Shipped:   Inflammatory Disorders: Humira    Other medication(s) to be shipped: No additional medications requested for fill at this time     Teresa Bowman, DOB: 01-19-1981  Phone: [phone] (home)       All above HIPAA information was verified with patient.     Was a Nurse, learning disability used for this call? No    Completed refill call assessment today to schedule patient's medication shipment from the [facility] [phone]).  All relevant notes have been reviewed.     Specialty medication(s) and dose(s) confirmed: Regimen is correct and unchanged.   Changes to medications: [name] reports no changes at this time.  Changes to insurance: No  New side effects reported not previously addressed with a pharmacist or physician: None reported  Questions for the pharmacist: No    Confirmed patient received a Conservation officer, historic buildings and a Surveyor, mining with first shipment. The patient will receive a drug information handout for each medication shipped and additional FDA Medication Guides as required.       DISEASE/MEDICATION-SPECIFIC INFORMATION        For patients on injectable medications: Patient currently has 1 dose left.  Next injection is scheduled for 02/07/23.    SPECIALTY MEDICATION ADHERENCE     Medication Adherence    Patient reported X missed doses in the last month: 0  Specialty Medication: Humira CF Pen 80mg /0.7ml  Patient is on additional specialty medications: No  Any gaps in refill history greater than 2 weeks in the last 3 months: no  Demonstrates understanding of importance of adherence: yes  Informant: patient  Provider-estimated medication adherence level: good  Patient is at risk for Non-Adherence: No        Were doses missed due to medication being on hold?  N/A    Humira 0.8  mg/0.69ml : 7 days of medicine on hand (1 injection on hand: weekly injections)      REFERRAL TO PHARMACIST Referral to the pharmacist: Not needed      [facility]     Shipping address confirmed in Epic.     Patient was notified of new phone menu : Yes    Delivery Scheduled: Yes, Expected medication delivery date: 02/09/23.     Medication will be delivered via UPS to the prescription address in Epic WAM.    Roderic Palau, PharmD   [facility] Specialty Pharmacist

## 2023-02-08 NOTE — Unmapped (Signed)
Teresa Bowman 's HUMIRA(CF) PEN 80 mg/0.8 mL Pnkt (adalimumab) shipment will be delayed as a result of the medication is too soon to refill until 02/17/23.     I have reached out to the patient  at [phone] and communicated the delivery change. We will reschedule the medication for the delivery date that the patient agreed upon.  We have confirmed the delivery date as 02/21/23

## 2023-02-08 NOTE — Unmapped (Signed)
I cancelled previous Humira delivery - Cosentyx to replace.     [facility]   Patient Onboarding/Medication Counseling    Teresa Bowman is a 42 y.o. female with HS who I am counseling today on initiation of therapy.  I am speaking to the patient.    Was a Nurse, learning disability used for this call? No    Verified patient's date of birth / HIPAA.    Specialty medication(s) to be sent: Inflammatory Disorders: Cosentyx      Non-specialty medications/supplies to be sent: na      Medications not needed at this time: na         Cosentyx (secukinumab)    Medication & Administration     Dosage: Hidradenitis Suppurativa: Inject 300mg  under the skin at weeks 0, 1, 2, 3, and 4 followed by 300 mg every 4 weeks; consider an increase to 300 mg every 2 weeks in patients who have an inadequate response      Lab tests required prior to treatment initiation:  Tuberculosis: Tuberculosis screening resulted in a non-reactive Quantiferon TB Gold assay.      Administration:       Prefilled Unoready?? auto-injector pen  Gather all supplies needed for injection on a clean, flat working surface: medication pen removed from packaging, alcohol swab, sharps container, etc.  Look at the medication label - look for correct medication, correct dose, and check the expiration date  Look at the medication - the liquid visible in the window on the side of the pen device should appear clear and colorless to slightly yellow  Lay the auto-injector pen on a flat surface and allow it to warm up to room temperature for at least  30-45 minutes  Select injection site - you can use the front of your thigh or your belly (but not the area 2 inches around your belly button); if someone else is giving you the injection you can also use your upper arm in the skin covering your triceps muscle  Prepare injection site - wash your hands and clean the skin at the injection site with an alcohol swab and let it air dry, do not touch the injection site again before the injection  Pull off the purple safety cap in the direction of the arrow, do not remove until immediately prior to injection and do not touch the red needle cover  Put the red needle cover against your skin at the injection site at a 90 degree angle, hold the pen such that you can see the clear medication window  Press down and hold the pen firmly against your skin, there will be a click when the injection starts  Continue to hold the pen firmly against your skin for about 10-15 seconds - the window will start to turn solid green  There will be a second click sound when the injection is almost complete, verify the window is solid green to indicate the injection is complete and then pull the pen away from your skin  Dispose of the used auto-injector pen immediately in your sharps disposal container the needle will be covered automatically  If you see any blood at the injection site, press a cotton ball or gauze on the site and maintain pressure until the bleeding stops, do not rub the injection site      Adherence/Missed dose instructions:  If your injection is given more than 4 days after your scheduled injection date - consult your pharmacist for additional instructions on how to  adjust your dosing schedule.        Goals of Therapy       Hidradenitis Suppurativa  Reduce the frequency and severity of new lesions  Minimize pain and suppuration  Prevent disease progression and limit scarring  Maintenance of effective psychosocial functioning        Side Effects & Monitoring Parameters     Injection site reaction (redness, irritation, inflammation localized to the site of administration)  Signs of a common cold - minor sore throat, runny or stuffy nose, etc.  Diarrhea    The following side effects should be reported to the provider:  Signs of a hypersensitivity reaction - rash; hives; itching; red, swollen, blistered, or peeling skin; wheezing; tightness in the chest or throat; difficulty breathing, swallowing, or talking; swelling of the mouth, face, lips, tongue, or throat; etc.  Reduced immune function - report signs of infection such as fever; chills; body aches; very bad sore throat; ear or sinus pain; cough; more sputum or change in color of sputum; pain with passing urine; wound that will not heal, etc.  Also at a slightly higher risk of some malignancies (mainly skin and blood cancers) due to this reduced immune function.  In the case of signs of infection - the patient should hold the next dose of Cosentyx?? and call your primary care provider to ensure adequate medical care.  Treatment may be resumed when infection is treated and patient is asymptomatic.  Muscle pain or weakness  Shortness of breath      Warnings, Precautions, & Contraindications     Have your bloodwork checked as you have been told by your prescriber  Talk with your doctor if you are pregnant, planning to become pregnant, or breastfeeding  Discuss the possible need for holding your dose(s) of Cosentyx?? when a planned procedure is scheduled with the prescriber as it may delay healing/recovery timeline       Drug/Food Interactions     Medication list reviewed in Epic. The patient was instructed to inform the care team before taking any new medications or supplements. No drug interactions identified.   If you have a latex allergy use caution when handling, the needle cap of the Cosentyx?? prefilled syringe and the safety cap for the Cosentyx Sensoready?? pen contains a derivative of natural rubber latex. Unoready?? pen does NOT contain latex.   Talk with you prescriber or pharmacist before receiving any live vaccinations while taking this medication and after you stop taking it      Storage, Handling Precautions, & Disposal     Store this medication in the refrigerator.  Do not freeze  May store intact Sensoready pens and 150 mg/mL prefilled syringes at ?30??C (?86??F) for up to 4 days; may return to the refrigerator if unused  Store in original packaging, protected from light  Do not shake  Dispose of used syringes/pens in a sharps disposal container           Current Medications (including OTC/herbals), Comorbidities and Allergies     Current Outpatient Medications   Medication Sig Dispense Refill    baclofen (LIORESAL) 10 MG tablet Take 1 tablet (10 mg total) by mouth Three (3) times a day.      clindamycin (CLEOCIN T) 1 % lotion Apply topically two (2) times a day. Apply to HS as needed until it clears 60 mL 11    clobetasoL (TEMOVATE) 0.05 % ointment Twice daily as needed for spot treatment for up to one week on  each spot 60 g 5    doxycycline (VIBRAMYCIN) 100 MG capsule Twice daily for 2 weeks for flare 30 capsule 4    DULoxetine (CYMBALTA) 60 MG capsule TAKE 1 CAPSULE BY MOUTH ONCE DAILY 30 capsule 2    empty container Misc Use as directed to dispose of Humira Pens 1 each 3    erythromycin (ROMYCIN) 5 mg/gram (0.5 %) ophthalmic ointment Administer to the right eye Three (3) times a day. 3.5 g 0    melatonin 3 mg Tab Take 1 tablet (3 mg total) by mouth every evening. 30 tablet 11    meloxicam (MOBIC) 15 MG tablet TAKE 1 TABLET BY MOUTH DAILY 90 tablet 3    miconazole (MICOTIN) 2 % cream Apply topically two (2) times a day. 28.35 g 3    montelukast (SINGULAIR) 10 mg tablet Take 1 tablet (10 mg total) by mouth every morning. 90 tablet 2    multivitamin (THERAGRAN) per tablet Take 1 tablet by mouth daily.      secukinumab (COSENTYX UNOREADY PEN) 300 mg/2 mL (150 mg/mL) PnIj Inject the contents of 1 pen (300mg ) under the skin weekly for 5 weeks as loading doses (on weeks 0,1,2,3,4) 10 mL 0    secukinumab (COSENTYX UNOREADY PEN) 300 mg/2 mL (150 mg/mL) PnIj Inject the contents of 1 pen (300mg ) under the skin every 28 days. 2 mL 10    spironolactone (ALDACTONE) 100 MG tablet Take 1 tablet (100 mg total) by mouth daily. 90 tablet 3    triamcinolone (KENALOG) 0.1 % cream Twice daily for up to 2 weeks for healing areas 80 g 2    VICTOZA 2-PAK 0.6 mg/0.1 mL (18 mg/3 mL) injection Inject 0.1 mL (0.6 mg total) under the skin daily. (Patient not taking: Reported on 01/30/2023)       No current facility-administered medications for this visit.       Allergies   Allergen Reactions    Naltrexone Swelling    Augmentin [Amoxicillin-Pot Clavulanate] Rash    Ceclor [Cefaclor] Rash    Penicillins Rash       Patient Active Problem List   Diagnosis    Bilateral low back pain with left-sided sciatica    Pain in thoracic spine at multiple sites    Allergic rhinitis    Morbid obesity (CMS-HCC)    Anxiety    Chronic pain syndrome    Mild intermittent asthma without complication    Vitamin D deficiency    Mixed incontinence    Anxiety, generalized    Depression    Insomnia    Long-term use of hydroxychloroquine    Suspected UTI    Hidradenitis suppurativa    Asthma    Atopic dermatitis    Blood coagulation disorder (CMS-HCC)    Chronic sinusitis    Delayed sleep phase syndrome    Inflammatory polyarthropathy (CMS-HCC)    Leukocytosis    Lumbar radiculopathy    Mixed stress and urge urinary incontinence    Trochanteric bursitis of left hip    Fibromyalgia    Low back pain       Reviewed and up to date in Epic.    Appropriateness of Therapy     Acute infections noted within Epic:  No active infections  Patient reported infection: None    Is medication and dose appropriate based on diagnosis and infection status? Yes    Prescription has been clinically reviewed: Yes      Baseline Quality of Life Assessment  How many days over the past month did your HS  keep you from your normal activities? For example, brushing your teeth or getting up in the morning. Frequently - has been having more severe flares.     Financial Information     Medication Assistance provided: Prior Authorization    Anticipated copay of $4/56 days reviewed with patient. Verified delivery address.    Delivery Information     Scheduled delivery date: 3/28    Expected start date: 3/28    Patient was notified of new phone menu: No    Medication will be delivered via UPS to the prescription address in [facility].  This shipment will not require a signature.      Explained the services we provide at [facility] and that each month we would call to set up refills.  Stressed importance of returning phone calls so that we could ensure they receive their medications in time each month.  Informed patient that we should be setting up refills 7-10 days prior to when they will run out of medication.  A pharmacist will reach out to perform a clinical assessment periodically.  Informed patient that a welcome packet, containing information about our pharmacy and other support services, a Notice of Privacy Practices, and a drug information handout will be sent.      The patient or caregiver noted above participated in the development of this care plan and knows that they can request review of or adjustments to the care plan at any time.      Patient or caregiver verbalized understanding of the above information as well as how to contact the pharmacy at [phone] option 4 with any questions/concerns.  The pharmacy is open Monday through Friday 8:30am-4:30pm.  A pharmacist is available 24/7 via pager to answer any clinical questions they may have.    Patient Specific Needs     Does the patient have any physical, cognitive, or cultural barriers? No    Does the patient have adequate living arrangements? (i.e. the ability to store and take their medication appropriately) Yes    Did you identify any home environmental safety or security hazards? No    Patient prefers to have medications discussed with  Patient     Is the patient or caregiver able to read and understand education materials at a high school level or above? Yes    Patient's primary language is  English     Is the patient high risk? No    SOCIAL DETERMINANTS OF HEALTH     At the [facility], we have learned that life circumstances - like trouble affording food, housing, utilities, or transportation can affect the health of many of our patients.   That is why we wanted to ask: are you currently experiencing any life circumstances that are negatively impacting your health and/or quality of life? Patient declined to answer    Social Determinants of Health     Financial Resource Strain: Low Risk  (05/26/2021)    Overall Financial Resource Strain (CARDIA)     Difficulty of Paying Living Expenses: Not hard at all   Internet Connectivity: Not on file   Food Insecurity: Medium Risk (12/09/2022)    Received from [facility]    Food vital sign     Within the past 12 months, you worried that your food would run out before you got money to buy more: Sometimes true     Within the past 12  months, the food you bought just didn't last and you didn't have money to get more: Not on file   Tobacco Use: Medium Risk (01/31/2023)    Patient History     Smoking Tobacco Use: Former     Smokeless Tobacco Use: Never     Passive Exposure: Past   Housing/Utilities: Unknown (05/28/2022)    Housing/Utilities     Within the past 12 months, have you ever stayed: outside, in a car, in a tent, in an overnight shelter, or temporarily in someone else's home (i.e. couch-surfing)?: No     Are you worried about losing your housing?: Not on file     Within the past 12 months, have you been unable to get utilities (heat, electricity) when it was really needed?: Not on file   Alcohol Use: Not At Risk (08/12/2021)    Alcohol Use     How often do you have a drink containing alcohol?: Never     How many drinks containing alcohol do you have on a typical day when you are drinking?: 1 - 2     How often do you have 5 or more drinks on one occasion?: Never   Transportation Needs: No Transportation Needs (12/09/2022)    Received from [employer]     In the past 12 months, has lack of reliable transportation kept you from medical appointments, meetings, work or from getting things needed for daily living? : No   Substance Use: Low Risk  (08/12/2021)    Substance Use     Taken prescription drugs for non-medical reasons: Never     Taken illegal drugs: Never     Patient indicated they have taken drugs in the past year for non-medical reasons: Yes, [positive answer(s)]: Not on file   Health Literacy: Low Risk  (05/10/2021)    Health Literacy     : Never   Physical Activity: Not on file   Interpersonal Safety: Not on file   Stress: Not on file   Intimate Partner Violence: Unknown (02/25/2022)    Received from [facility]    HITS     Physically Hurt: Not on file     Insult or Talk Down To: Not on file     Threaten Physical Harm: Not on file     Scream or Curse: Not on file   Depression: Not at risk (12/09/2022)    Received from [facility]    PHQ-2     Patient Health Questionnaire-2 Score: 0   Social Connections: Unknown (03/21/2022)    Received from [employer]    Social Network     Social Network: Not on file       Would you be willing to receive help with any of the needs that you have identified today? Not applicable       Meghan A Desiree Lucy [facility] Specialty Pharmacist

## 2023-02-15 NOTE — Unmapped (Signed)
Patient messaged requesting appt for kenalog injection to new flare.    TC to patient. Reports new flare in groin area that is not draining. Experienced chills last night but not sure if she had a fever. Denies chills/fever today. Has made appointment tomorrow morning with Dr. Archie Balboa.

## 2023-02-16 ENCOUNTER — Ambulatory Visit: Admit: 2023-02-16 | Discharge: 2023-02-17 | Payer: PRIVATE HEALTH INSURANCE

## 2023-02-16 DIAGNOSIS — L732 Hidradenitis suppurativa: Principal | ICD-10-CM

## 2023-02-16 NOTE — Unmapped (Signed)
[facility] releases most results to you as soon as they are available. Therefore, you may see some results before we do. Please give us 3 business days to review the tests and contact you by phone or through MyChart. If you are concerned that some results may be upsetting or confusing, you may wish to wait until we contact you before looking at the report in MyChart.   If you have an urgent question, you can send us a message or call our clinic. Otherwise, we prefer that you wait 3 business days for us to contact you.    [facility] Clinical Team

## 2023-02-16 NOTE — Unmapped (Signed)
Dermatology Note    Assessment and Plan:      Hidradenitis Suppurativa, Hurley stage II - chronic, flaring today on vulva  Failed adalimumab  - Discussed chronic, relapsing nature of disease and treatment options, including antibiotics, immune modulators, surgery, and laser.  - Patient prefers to avoid oral antibiotics for now due to history of yeast infections  - discussed option of continuing adalimumab vs infliximab (infection, immunosuppression, avoid giving with active malignancy) vs secukinumab (infection, immunosuppression, safer than TNFi, better long-term efficacy). Cannot predict whether a different class of medication will be more or less effective. Jointly decided to switch adalimumab to secukinumab. Will continue on adalimumab 80 mg weekly until secukinumab is received  - Continue secukinumab (COSENTYX UNOREADY PEN) 300 mg/2 mL (150 mg/mL) PnIj; Inject under the skin: 300 mg at weeks 0, 1, 2, 3, and 4  - Continue secukinumab (COSENTYX UNOREADY PEN) 300 mg/2 mL (150 mg/mL) PnIj; Inject 300 mg under the skin every twenty-eight (28) days.  - Continue spironolactone 100 mg daily, R/A/B reviewed  - Continue triamcinolone 0.1% cream BID to affected area for up to 1 week at a time as needed for flares.   Appropriate use and side effects of topical steroids discussed.  - switch from wipes/solution to lotion: start clindamycin (CLEOCIN T) 1 % lotion; Apply topically two (2) times a day. Apply to HS as needed until it clears  - patient stopped metformin   - continue doxycycline for flares  - ILK on flaring lesion as below  - Discussed potential surgeries if not improved in the future     Intralesional Kenalog Procedure Note: After the patient was informed of risks (including atrophy and dyspigmentation), benefits and side effects of intralesional steroid injection, the patient elected to undergo injection and verbal consent was obtained. Skin was cleaned with alcohol and injected intralesionally into the sites (below). The patient tolerated the procedure well without complications and was instructed on post-procedure care.  Location(s): R labia, left mons  Number of sites treated: 2  Kenalog (triamcinolone) Concentration: 40 mg/ml   Volume: 0.4 ml total     High risk medication use (adalimumab switching to secukinumab)  - Quantiferon TB Gold Plus    The patient was advised to call for an appointment should any new, changing, or symptomatic lesions develop.     RTC: 02/27/2023 with Dr. Janyth Contes or sooner as needed   _________________________________________________________________      Chief Complaint     No chief complaint on file.      HPI     Teresa Bowman is a 42 y.o. female who presents as a returning patient (last seen 01/31/2023) for follow up of HS. At [facility], was started on cosentyx. She was continued on spironolactone and triamcinolone  for flares.     Today, she returns in setting of flare on R labia and requests injection with intra-lesional kenalog.    The patient denies any other new or changing lesions or areas of concern.     Pertinent Past Medical History     Past Medical History, Family History, Social History, Medication List, Allergies, and Problem List were reviewed in the rooming section of Epic.     ROS: Other than symptoms mentioned in the HPI, no fevers, chills, or other skin complaints    Physical Examination     GENERAL: Well-appearing female in no acute distress, resting comfortably.  NEURO: Alert and oriented, answers questions appropriately  PSYCH: Normal mood and affect  EYES: lids  clear, conjunctiva pink, no scleral icterus or other color change  RESP: No increased work of breathing  SKIN (Focal Skin Exam): Per patient request, examination of external genitalia was performed  - inflammatory, erythematous nodule on R labia  - atrophic scar on mons with underlying firm nodule    All areas not commented on are within normal limits or unremarkable    (Approved Template 08/03/2020)

## 2023-02-24 NOTE — Unmapped (Signed)
Dermatology Note    Assessment and Plan:      Hidradenitis Suppurativa, Hurley stage II - chronic, flaring today in groin, s/p first loading dose Cosentyx  Failed adalimumab 80mg  weekly.   - Discussed chronic, relapsing nature of disease and treatment options, including antibiotics, immune modulators, surgery, and laser.  - Continue secukinumab (COSENTYX UNOREADY PEN) 300 mg/2 mL (150 mg/mL) PnIj; Inject under the skin: 300 mg at weeks 1, 2, 3, and 4  - Continue secukinumab (COSENTYX UNOREADY PEN) 300 mg/2 mL (150 mg/mL) PnIj; Inject 300 mg under the skin every twenty-eight (28) days.  - Continue spironolactone 100 mg daily, R/A/B reviewed  - Continue triamcinolone 0.1% cream BID to affected area for up to 1 week at a time as needed for flares. Appropriate use and side effects of topical steroids discussed.  - Continue clindamycin (CLEOCIN T) 1 % lotion; Apply topically two (2) times a day. Apply to HS as needed until it clears  - Continue doxycycline 100 mg BID for flares  - Discussed potential surgeries if not improved in the future    Intralesional Kenalog Procedure Note: After the patient was informed of risks (including atrophy and dyspigmentation), benefits and side effects of intralesional steroid injection, the patient elected to undergo injection and verbal consent was obtained. Skin was cleaned with alcohol and injected intralesionally into the sites (below). The patient tolerated the procedure well without complications and was instructed on post-procedure care.  Location(s): R inguinal  Number of sites treated: 1  Kenalog (triamcinolone) Concentration: 40 mg/ml   Volume: 0.2 ml total     High risk medication use (secukinumab)  - Quantiferon TB Gold negative 01/2023    The patient was advised to call for an appointment should any new, changing, or symptomatic lesions develop.     RTC: Return in about 4 months (around 06/29/2023). or sooner as needed _________________________________________________________________      Chief Complaint     Chief Complaint   Patient presents with    hs     Groin area with drainage       HPI     Teresa Bowman. Teresa Bowman is a 42 y.o. female who presents as a returning patient (last seen 02/16/2023) for follow up of hidradenitis suppurativa.     She reports that flare in groin has started to subside after ILK last visit. She also applied iodine-alcohol mixture which educed pain and helped it drain but area is still lingering. She took her first loading of Cosentyx about 6 days ago.    The patient denies any other new or changing lesions or areas of concern.     Pertinent Past Medical History     Past Medical History, Family History, Social History, Medication List, Allergies, and Problem List were reviewed in the rooming section of Epic.     ROS: Other than symptoms mentioned in the HPI, no fevers, chills, or other skin complaints    Physical Examination     GENERAL: Well-appearing female in no acute distress, resting comfortably.  NEURO: Alert and oriented, answers questions appropriately  SKIN (Focal Skin Exam): Per patient request, examination of groin was performed    location Abscess Inflamed nodule Non-inflamed nodule Draining sinus Non-draining Sinus Hurley % scar   R axilla          L axilla          R inframammary          L inframammary  Intermammary          Pubic          R inguinal  1        R thigh          L inguinal          L thigh          Scrotum/Vulva          Perianal          R buttock          L buttock          Other (list)                      AN count (total sum of abscess and inflammatory nodule): 1    All areas not commented on are within normal limits or unremarkable    Scribe's Attestation: Elsie Stain, MD obtained and performed the history, physical exam and medical decision making elements that were entered into the chart.  Signed by Minette Brine, Scribe, on February 27, 2023 at 2:09 PM.    ----------------------------------------------------------------------------------------------------------------------  February 27, 2023 3:48 PM. Documentation assistance provided by the Scribe. I was present during the time the encounter was recorded. The information recorded by the Scribe was done at my direction and has been reviewed and validated by me.  ----------------------------------------------------------------------------------------------------------------------

## 2023-02-27 ENCOUNTER — Ambulatory Visit: Admit: 2023-02-27 | Discharge: 2023-02-28 | Payer: PRIVATE HEALTH INSURANCE

## 2023-02-27 DIAGNOSIS — L732 Hidradenitis suppurativa: Principal | ICD-10-CM

## 2023-02-27 NOTE — Unmapped (Deleted)
Self-reported severity (0-5): 2  VAS pain today: 2  VAS average pain for the last month: 3  Requiring pain medication?  -   If so, what type/frequency? -  How often in pain?  few times a day  Level of odor (0-5): 3  Level of itching (0-5): 2  Dressing changes needed for drainage:Twice a day  How much drainage: a little drainage  Flare in the last month (Y/N)? Yes.  How long ago was the last flare? -  Developing new lesions? less than monthly  Number of inflammatory lesions montly: 1-3  DLQI: 16  Current treatment: ***     How helpful is the current treatment in managing the following aspects of your disease?  Not at all helpful Somewhat helpful Very helpful   Pain  x    Decreasing length of flares   x   Decreasing new lesions   x   Drainage   x   Decreasing frequency of flares   x   Decreasing severity of flares   x   Odor   x

## 2023-03-06 LAB — QUANTIFERON TB GOLD PLUS
QUANTIFERON MITOGEN VALUE: >10 [IU]/mL
QUANTIFERON NIL VALUE: 0.06 [IU]/mL
QUANTIFERON TB GOLD: NEGATIVE
QUANTIFERON TB1 AG VALUE: 0.06 [IU]/mL
QUANTIFERON TB2 AG VALUE: 0.06 [IU]/mL

## 2023-03-13 NOTE — Unmapped (Signed)
Teresa Bowman reports her Cosentyx therapy is going well so far. She's tolerated the injections and hasn't had any new HS flares.     Her 5th and final loading dose will be due 4/30 then first maintenance will be due on 5/28.    [facility] Clinical Assessment & Refill Coordination Note    Courteney Interrante, [place]: 10/06/1981  Phone: [phone] (home)     All above HIPAA information was verified with patient.     Was a Nurse, learning disability used for this call? No    Specialty Medication(s):   Inflammatory Disorders: Cosentyx     Current Outpatient Medications   Medication Sig Dispense Refill    baclofen (LIORESAL) 10 MG tablet Take 1 tablet (10 mg total) by mouth Three (3) times a day.      clindamycin (CLEOCIN T) 1 % lotion Apply topically two (2) times a day. Apply to HS as needed until it clears 60 mL 11    clobetasoL (TEMOVATE) 0.05 % ointment Twice daily as needed for spot treatment for up to one week on each spot 60 g 5    doxycycline (VIBRAMYCIN) 100 MG capsule Twice daily for 2 weeks for flare 30 capsule 4    DULoxetine (CYMBALTA) 60 MG capsule TAKE 1 CAPSULE BY MOUTH ONCE DAILY 30 capsule 2    empty container Misc Use as directed to dispose of Humira Pens 1 each 3    erythromycin (ROMYCIN) 5 mg/gram (0.5 %) ophthalmic ointment Administer to the right eye Three (3) times a day. 3.5 g 0    melatonin 3 mg Tab Take 1 tablet (3 mg total) by mouth every evening. 30 tablet 11    meloxicam (MOBIC) 15 MG tablet TAKE 1 TABLET BY MOUTH DAILY 90 tablet 3    miconazole (MICOTIN) 2 % cream Apply topically two (2) times a day. 28.35 g 3    multivitamin (THERAGRAN) per tablet Take 1 tablet by mouth daily.      secukinumab (COSENTYX UNOREADY PEN) 300 mg/2 mL (150 mg/mL) PnIj Inject the contents of 1 pen (300mg ) under the skin weekly for 5 weeks as loading doses (on weeks 0,1,2,3,4) 10 mL 0    secukinumab (COSENTYX UNOREADY PEN) 300 mg/2 mL (150 mg/mL) PnIj Inject the contents of 1 pen (300mg ) under the skin every 28 days. 2 mL 10    spironolactone (ALDACTONE) 100 MG tablet Take 1 tablet (100 mg total) by mouth daily. 90 tablet 3    triamcinolone (KENALOG) 0.1 % cream Twice daily for up to 2 weeks for healing areas 80 g 2     Current Facility-Administered Medications   Medication Dose Route Frequency Provider Last Rate Last Admin    triamcinolone acetonide (KENALOG-40) injection 40 mg  40 mg Intralesional Once Elsie Stain, MD            Changes to medications: Tishie reports no changes at this time.    Allergies   Allergen Reactions    Naltrexone Swelling    Augmentin [Amoxicillin-Pot Clavulanate] Rash    Ceclor [Cefaclor] Rash    Penicillins Rash       Changes to allergies: No    SPECIALTY MEDICATION ADHERENCE     Cosentyx - 1 left    Medication Adherence    Patient reported X missed doses in the last month: 0  Specialty Medication: Cosentyx          Specialty medication(s) dose(s) confirmed: Regimen is correct and unchanged.  Are there any concerns with adherence? No    Adherence counseling provided? Not needed    CLINICAL MANAGEMENT AND INTERVENTION      Clinical Benefit Assessment:    Do you feel the medicine is effective or helping your condition? Yes    Clinical Benefit counseling provided? Not needed    Adverse Effects Assessment:    Are you experiencing any side effects? No    Are you experiencing difficulty administering your medicine? No    Quality of Life Assessment:    Quality of Life    Rheumatology  Oncology  Dermatology  1. What impact has your specialty medication had on the symptoms of your skin condition (i.e. itchiness, soreness, stinging)?: Some  2. What impact has your specialty medication had on your comfort level with your skin?: Some  Cystic Fibrosis          How many days over the past month did your HS  keep you from your normal activities? For example, brushing your teeth or getting up in the morning. 0    Have you discussed this with your provider? Not needed    Acute Infection Status:    Acute infections noted within Epic:  No active infections  Patient reported infection: None    Therapy Appropriateness:    Is therapy appropriate and patient progressing towards therapeutic goals? Yes, therapy is appropriate and should be continued    DISEASE/MEDICATION-SPECIFIC INFORMATION      For patients on injectable medications: Patient currently has 1 doses left.  Next injection is scheduled for 4/30.    Chronic Inflammatory Diseases: Have you experienced any flares in the last month? No  Has this been reported to your provider? No    PATIENT SPECIFIC NEEDS     Does the patient have any physical, cognitive, or cultural barriers? No    Is the patient high risk? No    Did the patient require a clinical intervention? No    Does the patient require physician intervention or other additional services (i.e., nutrition, smoking cessation, social work)? No    SOCIAL DETERMINANTS OF HEALTH     At the [facility], we have learned that life circumstances - like trouble affording food, housing, utilities, or transportation can affect the health of many of our patients.   That is why we wanted to ask: are you currently experiencing any life circumstances that are negatively impacting your health and/or quality of life? Patient declined to answer    Social Determinants of Health     Financial Resource Strain: Low Risk  (05/26/2021)    Overall Financial Resource Strain (CARDIA)     Difficulty of Paying Living Expenses: Not hard at all   Internet Connectivity: Not on file   Food Insecurity: Medium Risk (12/09/2022)    Received from [facility]    Food vital sign     Within the past 12 months, you worried that your food would run out before you got money to buy more: Sometimes true     Within the past 12 months, the food you bought just didn't last and you didn't have money to get more: Not on file   Tobacco Use: Medium Risk (02/16/2023)    Patient History     Smoking Tobacco Use: Former     Smokeless Tobacco Use: Never     Passive Exposure: Past   Housing/Utilities: Unknown (05/28/2022)    Housing/Utilities     Within the past 12 months, have you ever  stayed: outside, in a car, in a tent, in an overnight shelter, or temporarily in someone else's home (i.e. couch-surfing)?: No     Are you worried about losing your housing?: Not on file     Within the past 12 months, have you been unable to get utilities (heat, electricity) when it was really needed?: Not on file   Alcohol Use: Not At Risk (08/12/2021)    Alcohol Use     How often do you have a drink containing alcohol?: Never     How many drinks containing alcohol do you have on a typical day when you are drinking?: 1 - 2     How often do you have 5 or more drinks on one occasion?: Never   Transportation Needs: No Transportation Needs (12/09/2022)    Received from [employer]     In the past 12 months, has lack of reliable transportation kept you from medical appointments, meetings, work or from getting things needed for daily living? : No   Substance Use: Low Risk  (08/12/2021)    Substance Use     Taken prescription drugs for non-medical reasons: Never     Taken illegal drugs: Never     Patient indicated they have taken drugs in the past year for non-medical reasons: Yes, [positive answer(s)]: Not on file   Health Literacy: Low Risk  (05/10/2021)    Health Literacy     : Never   Physical Activity: Not on file   Interpersonal Safety: Not on file   Stress: Not on file   Intimate Partner Violence: Unknown (02/25/2022)    Received from [facility]    HITS     Physically Hurt: Not on file     Insult or Talk Down To: Not on file     Threaten Physical Harm: Not on file     Scream or Curse: Not on file   Depression: Not at risk (12/09/2022)    Received from [facility]    PHQ-2     Patient Health Questionnaire-2 Score: 0   Social Connections: Unknown (03/21/2022)    Received from [facility]    Social Network     Social Network: Not on file       Would you be willing to receive help with any of the needs that you have identified today? Not applicable       SHIPPING     Specialty Medication(s) to be Shipped:   Inflammatory Disorders: Cosentyx    Other medication(s) to be shipped: No additional medications requested for fill at this time     Changes to insurance: No    Delivery Scheduled: Yes, Expected medication delivery date: 4/24.     Medication will be delivered via UPS to the confirmed prescription address in [facility].    The patient will receive a drug information handout for each medication shipped and additional FDA Medication Guides as required.  Verified that patient has previously received a Conservation officer, historic buildings and a Surveyor, mining.    The patient or caregiver noted above participated in the development of this care plan and knows that they can request review of or adjustments to the care plan at any time.      All of the patient's questions and concerns have been addressed.    Lanney Gins   [facility] Specialty Pharmacist

## 2023-03-14 MED ORDER — DULOXETINE 60 MG CAPSULE,DELAYED RELEASE
ORAL_CAPSULE | Freq: Every day | ORAL | 0 refills | 30 days | Status: CP
Start: 2023-03-14 — End: ?

## 2023-03-14 MED FILL — COSENTYX UNOREADY PEN 300 MG/2 ML (150 MG/ML) SUBCUTANEOUS: 28 days supply | Qty: 2 | Fill #1

## 2023-03-14 NOTE — Unmapped (Signed)
Refill request received for patient.       Medication Requested: Duloxetine 60 mg  Last Office Visit: 08/19/2021   Next Office Visit: Visit date not found       Please refill if appropriate

## 2023-03-24 NOTE — Unmapped (Addendum)
Called and spoke w/ pt offered 03-29-2023 @ 830 w/ Sayed.  Pt declined.  No PA appts. Open.  Will call back if there is a cancellation.  ----- Message from Elsie Stain, MD sent at 03/23/2023  4:55 PM EDT -----  The soonest I could do would be 8:30 next Wednesday, or we can try to offer with another provider including a PA with something that opens sooner.  Thanks -CS  ----- Message -----  From: Ricky Stabs  Sent: 03/20/2023   4:20 PM EDT  To: Elsie Stain, MD    Pt called stated she is having a flare up.  Can she be booked tomorrow PM?

## 2023-03-27 ENCOUNTER — Ambulatory Visit
Admit: 2023-03-27 | Discharge: 2023-03-28 | Payer: PRIVATE HEALTH INSURANCE | Attending: Student in an Organized Health Care Education/Training Program | Primary: Student in an Organized Health Care Education/Training Program

## 2023-03-27 DIAGNOSIS — L732 Hidradenitis suppurativa: Principal | ICD-10-CM

## 2023-03-27 NOTE — Unmapped (Signed)
Dermatology Note     Assessment and Plan     Hidradenitis Suppurativa, Hurley stage II - chronic, flaring today in groin, recently initiated on Cosentyx  Failed adalimumab 80mg  weekly.   - Discussed chronic, relapsing nature of disease and treatment options, including antibiotics, immune modulators, surgery, and laser.  - Continue secukinumab (COSENTYX UNOREADY PEN) 300 mg/2 mL (150 mg/mL) PnIj; Inject under the skin: 300 mg at weeks 1, 2, 3, and 4  - Continue secukinumab (COSENTYX UNOREADY PEN) 300 mg/2 mL (150 mg/mL) PnIj; Inject 300 mg under the skin every twenty-eight (28) days.  - Continue spironolactone 100 mg daily, R/A/B reviewed  - Continue triamcinolone 0.1% cream BID to affected area for up to 1 week at a time as needed for flares. Appropriate use and side effects of topical steroids discussed.  - Continue clindamycin (CLEOCIN T) 1 % lotion; Apply topically two (2) times a day. Apply to HS as needed until it clears  - Continue doxycycline 100 mg BID for flares  - Discussed potential surgeries if not improved in the future  - Discussed ILK vs punch excision for small inflammatory lesion on the left mons pubis. Patient prefers to have the area removed, and it is currently calm and only about 3 mm, making a simple punch excision feasible. See procedure note below.    Punch Excision Procedure Note: After discussion of risks, benefits, alternatives, patient agreed to procedure and verbal consent was obtained. The area was marked, photographed, prepped with alcohol, and anesthetized with lidocaine 2% with epinephrine. Removal was performed using a punch technique with a 4 mm punch. Hemostasis and closure with achieved in the usual fashion with pressure and 4-0 nylon suture. Bandage was applied and instructions were provided. Patient will be informed of results via their preferred communication method.  Lesion A: Location: left mons pubis. Size: 3 mm.   DDX:  HS inflammatory nodule vs folliculitis vs other  Suture removal plan:  10 days in clinic     High risk medication use (secukinumab)  - Quantiferon TB Gold negative 01/2023    The patient was advised to call for an appointment should any new, changing, or symptomatic lesions develop.     RTC: Return in about 3 months (around 06/27/2023) for Recheck with Dr. Janyth Contes. or sooner as needed     Chief Complaint     Chief Complaint   Patient presents with    HS     Flaring in groin area       HPI     Teresa Bowman is a 42 y.o. female who presents as a returning patient (last seen 02/27/2023) [facility] for follow up of HS.     Reports a recurrently inflamed lesion on the left mons pubis at the edge of a surgical scar.. It has been treated with ILK a couple times with some benefit and is currently calm. Patient asks whether it can be removed with a punch excision, which has been helpful for similar sites in the past.    She continues on Cosentyx, which she started a little over a month ago. She has not noticed much benefit from it yet but is tolerated it without issue.    The patient denies any other new or changing lesions or areas of concern.     Pertinent Past Medical History       Past Medical History, Family History, Social History, Medication List, Allergies, and Problem List were reviewed in the rooming section of  Epic.     ROS: Other than symptoms mentioned in the HPI, no fevers, chills, or other skin complaints.    Physical Examination     GENERAL: Well-appearing female in no acute distress, resting comfortably.  NEURO: Alert and oriented, answers questions appropriately  PSYCH: Normal mood and affect  RESP: No increased work of breathing  SKIN (Focal Skin Exam): Per patient request, examination of the mons pubis was performed    - 3mm inflamed folliculocentric papule on the left mons pubis within well-healed surgical scar    All areas not commented on are within normal limits or unremarkable

## 2023-03-28 NOTE — Unmapped (Signed)
Punch biopsy  Punch biopsy involves numbing a small area of your skin, then obtaining a sample to help us make a proper diagnosis of your skin condition. The biopsy site is typically closed with one to 3 small stitches to help the site heal. Biopsy results will usually return in 7-14 days.    To care for the area: Leave the bandage in place until the morning after your procedure is performed. On a daily basis, carefully remove the bandage, then shower or wash as usual. Allow water to run over the site. Please do not scrub. Carefully dry the area, then apply ointment (some people develop an allergy to Neosporin, so we recommend Vaseline or Aquaphor). Cover the site with a fresh bandage. Should any bleeding occur, apply firm pressure for 15 minutes. The treated site will heal best if  a scab never forms (the wound heals by new skin cells traveling from the outside toward the middle-their journey is easier if no scab stands in their way).    Long-term care: the site will be more sensitive than your surrounding skin. Keep it covered, and remember to apply sunscreen every day to all your exposed skin. A scar may remain which is lighter or pinker than your normal skin. Your body will continue to improve your scar for up to one year.    Infection following this procedure is rare. However, if you are worried about the appearance of your site, contact your doctor. Complete healing of the site may take up to one month. We have a physician on call at all times. If you have any concerns about the site, please call our clinic at [phone]    Your stitches should be removed in 1 to 2 weeks. Typically it is about 1 week for the face and 2 weeks for other areas. Please follow the time frame given to you at your appointment.

## 2023-04-06 ENCOUNTER — Ambulatory Visit: Admit: 2023-04-06 | Discharge: 2023-04-07 | Payer: PRIVATE HEALTH INSURANCE

## 2023-04-06 DIAGNOSIS — L720 Epidermal cyst: Principal | ICD-10-CM

## 2023-04-06 NOTE — Unmapped (Signed)
Patient presents for suture removal.  Sutures were placed in the [facility] [place] on  03/27/23 .  Patient was instructed to return in  14  days for removal.   The incision line is well approximated, well healed and without redness or drainage.

## 2023-04-10 NOTE — Unmapped (Signed)
[facility] Refill Coordination Note    Specialty Medication(s) to be Shipped:   Inflammatory Disorders: Cosentyx    Other medication(s) to be shipped: No additional medications requested for fill at this time     Teresa Bowman, DOB: 1981-05-30  Phone: [phone] (home)       All above HIPAA information was verified with patient.     Was a Nurse, learning disability used for this call? No    Completed refill call assessment today to schedule patient's medication shipment from the [facility] [phone]).  All relevant notes have been reviewed.     Specialty medication(s) and dose(s) confirmed: Regimen is correct and unchanged.   Changes to medications: [name] reports no changes at this time.  Changes to insurance: No  New side effects reported not previously addressed with a pharmacist or physician: None reported  Questions for the pharmacist: No    Confirmed patient received a Conservation officer, historic buildings and a Surveyor, mining with first shipment. The patient will receive a drug information handout for each medication shipped and additional FDA Medication Guides as required.       DISEASE/MEDICATION-SPECIFIC INFORMATION        For patients on injectable medications: Patient currently has 0 doses left.  Next injection is scheduled for 5/28.    SPECIALTY MEDICATION ADHERENCE     Medication Adherence    Patient reported X missed doses in the last month: 0  Specialty Medication: secukinumab (COSENTYX UNOREADY PEN) 300 mg/2 mL (150 mg/mL) PnIj  Patient is on additional specialty medications: No  Informant: patient              Were doses missed due to medication being on hold? No    Cosentyx 300/2 mg/ml: 0 days of medicine on hand       REFERRAL TO PHARMACIST     Referral to the pharmacist: Not needed      [facility]     Shipping address confirmed in Epic.       Delivery Scheduled: Yes, Expected medication delivery date: 5/24.     Medication will be delivered via UPS to the prescription address in Epic WAM.    [name]   [facility] Specialty Technician

## 2023-04-13 MED FILL — COSENTYX UNOREADY PEN 300 MG/2 ML (150 MG/ML) SUBCUTANEOUS: SUBCUTANEOUS | 28 days supply | Qty: 2 | Fill #0

## 2023-05-09 NOTE — Unmapped (Signed)
[facility] Refill Coordination Note    Specialty Medication(s) to be Shipped:   Inflammatory Disorders: Cosentyx    Other medication(s) to be shipped: No additional medications requested for fill at this time     Teresa Bowman, DOB: Mar 22, 1981  Phone: There are no phone numbers on file.      All above HIPAA information was verified with patient.     Was a Nurse, learning disability used for this call? No    Completed refill call assessment today to schedule patient's medication shipment from the [facility] [phone]).  All relevant notes have been reviewed.     Specialty medication(s) and dose(s) confirmed: Regimen is correct and unchanged.   Changes to medications: Genet reports no changes at this time.  Changes to insurance: No  New side effects reported not previously addressed with a pharmacist or physician: None reported  Questions for the pharmacist: No    Confirmed patient received a Conservation officer, historic buildings and a Surveyor, mining with first shipment. The patient will receive a drug information handout for each medication shipped and additional FDA Medication Guides as required.       DISEASE/MEDICATION-SPECIFIC INFORMATION        For patients on injectable medications: Patient currently has 0 doses left.  Next injection is scheduled for 06/25.    SPECIALTY MEDICATION ADHERENCE     Medication Adherence    Patient reported X missed doses in the last month: 0  Specialty Medication: secukinumab (COSENTYX UNOREADY PEN) 300 mg/2 mL (150 mg/mL) PnIj  Patient is on additional specialty medications: No  Patient is on more than two specialty medications: No  Any gaps in refill history greater than 2 weeks in the last 3 months: no  Demonstrates understanding of importance of adherence: no  Informant: patient  Reliability of informant: reliable  Provider-estimated medication adherence level: good  Patient is at risk for Non-Adherence: No  Reasons for non-adherence: no problems identified  Confirmed plan for next specialty medication refill: delivery by pharmacy  Refills needed for supportive medications: not needed          Refill Coordination    Has the Patients' Contact Information Changed: No  Is the Shipping Address Different: No         Were doses missed due to medication being on hold? No    cosentyx 300/2 mg/ml: 0 days of medicine on hand       REFERRAL TO PHARMACIST     Referral to the pharmacist: Not needed      [facility]     Shipping address confirmed in Epic.       Delivery Scheduled: Yes, Expected medication delivery date: 06/21.     Medication will be delivered via UPS to the prescription address in Epic WAM.    Antonietta Barcelona   [facility] Specialty Technician

## 2023-05-11 MED FILL — COSENTYX UNOREADY PEN 300 MG/2 ML (150 MG/ML) SUBCUTANEOUS: SUBCUTANEOUS | 28 days supply | Qty: 2 | Fill #1

## 2023-06-07 NOTE — Unmapped (Signed)
[facility] Refill Coordination Note    Specialty Medication(s) to be Shipped:   Inflammatory Disorders: Cosentyx    Other medication(s) to be shipped: No additional medications requested for fill at this time     Marche Granier, DOB: 1981/04/21  Phone: There are no phone numbers on file.      All above HIPAA information was verified with patient.     Was a Nurse, learning disability used for this call? No    Completed refill call assessment today to schedule patient's medication shipment from the [facility] [phone]).  All relevant notes have been reviewed.     Specialty medication(s) and dose(s) confirmed: Regimen is correct and unchanged.   Changes to medications: Keari reports no changes at this time.  Changes to insurance: No  New side effects reported not previously addressed with a pharmacist or physician: None reported  Questions for the pharmacist: No    Confirmed patient received a Conservation officer, historic buildings and a Surveyor, mining with first shipment. The patient will receive a drug information handout for each medication shipped and additional FDA Medication Guides as required.       DISEASE/MEDICATION-SPECIFIC INFORMATION        For patients on injectable medications: Patient currently has 0 doses left.  Next injection is scheduled for 06/13/23.    SPECIALTY MEDICATION ADHERENCE     Medication Adherence    Patient reported X missed doses in the last month: 0  Specialty Medication: secukinumab (COSENTYX UNOREADY PEN) 300 mg/2 mL (150 mg/mL) PnIj  Informant: patient        Were doses missed due to medication being on hold? No    Cosentyx 300/22 mg/ml: 6 days of medicine on hand       REFERRAL TO PHARMACIST     Referral to the pharmacist: Not needed      [facility]     Shipping address confirmed in Epic.       Delivery Scheduled: Yes, Expected medication delivery date: 06/13/23.     Medication will be delivered via UPS to the prescription address in Epic WAM.    Hargun Spurling Vangie Bicker, PharmD   [facility] Specialty Pharmacist

## 2023-06-12 MED FILL — COSENTYX UNOREADY PEN 300 MG/2 ML (150 MG/ML) SUBCUTANEOUS: SUBCUTANEOUS | 28 days supply | Qty: 2 | Fill #2

## 2023-07-03 NOTE — Unmapped (Unsigned)
Dermatology Note    Assessment and Plan:      Hidradenitis Suppurativa, Hurley stage II - chronic, flaring today in groin, recently initiated on Cosentyx  Failed adalimumab 80mg  weekly.   - Discussed chronic, relapsing nature of disease and treatment options, including antibiotics, immune modulators, surgery, and laser.  - Continue secukinumab (COSENTYX UNOREADY PEN) 300 mg/2 mL (150 mg/mL) PnIj; Inject under the skin: 300 mg at weeks 1, 2, 3, and 4  - Continue secukinumab (COSENTYX UNOREADY PEN) 300 mg/2 mL (150 mg/mL) PnIj; Inject 300 mg under the skin every twenty-eight (28) days.  - Continue spironolactone 100 mg daily, R/A/B reviewed  - Continue triamcinolone 0.1% cream BID to affected area for up to 1 week at a time as needed for flares. Appropriate use and side effects of topical steroids discussed.  - Continue clindamycin (CLEOCIN T) 1 % lotion; Apply topically two (2) times a day. Apply to HS as needed until it clears  - Continue doxycycline 100 mg BID for flares  - Discussed potential surgeries if not improved in the future  - Discussed ILK vs punch excision for small inflammatory lesion on the left mons pubis. Patient prefers to have the area removed, and it is currently calm and only about 3 mm, making a simple punch excision feasible. See procedure note below.     Punch Excision Procedure Note: After discussion of risks, benefits, alternatives, patient agreed to procedure and verbal consent was obtained. The area was marked, photographed, prepped with alcohol, and anesthetized with lidocaine 2% with epinephrine. Removal was performed using a punch technique with a 4 mm punch. Hemostasis and closure with achieved in the usual fashion with pressure and 4-0 nylon suture. Bandage was applied and instructions were provided. Patient will be informed of results via their preferred communication method.  Lesion A: Location: left mons pubis. Size: 3 mm.   DDX:  HS inflammatory nodule vs folliculitis vs other  Suture removal plan:  10 days in clinic     High risk medication use (secukinumab)  - Quantiferon TB Gold negative 01/2023    RTC: No follow-ups on file. or sooner as needed   _________________________________________________________________      Chief Complaint     No chief complaint on file.      HPI     Teresa Bowman. Teresa Bowman is a 42 y.o. female who presents as a returning patient (last seen No previous visit found) for follow up of hidradenitis suppurativa. At last visit, patient was to continue cosentyx 300 mg, spironolactone 100 mg, triamcinolone 0,1% cream, clindamycin 1% lotion,a and doxycycline 100 mg for HS.    Self-reported severity (0-5): {0-5:36085}  VAS pain today: {numbers 0-10:5044}  VAS average pain for the last month: {numbers 0-10:5044}  Requiring pain medication? {YES/NO:21013}  If so, what type/frequency? ***  How often in pain?  {hspain:53295}  Level of odor (0-5): {0-5 selection:53296}  Level of itching (0-5): {0-5 selection:53296}  Dressing changes needed for drainage:{hs dressings:52675}  How much drainage: {hsdrainage:52676}  Flare in the last month (Y/N)? {YES/NO:21013}  How long ago was the last flare? {last flare:53297}  Developing new lesions? {hsnewlesion:52677}  Number of inflammatory lesions montly: {number of lesions monthly:53298}  DLQI: ***  Current treatment: ***     How helpful is the current treatment in managing the following aspects of your disease?  Not at all helpful Somewhat helpful Very helpful   Pain      Decreasing length of flares  Decreasing new lesions      Drainage      Decreasing frequency of flares      Decreasing severity of flares      Odor        Pertinent Past Medical History     ** Carry forward disease course, prior tx, and other relevant PMH from prior notes here **    Past Medical History, Family History, Social History, Medication List, Allergies, and Problem List were reviewed in the rooming section of Epic.     ROS: Other than symptoms mentioned in the HPI, no fevers, chills, or other skin complaints    Physical Examination     Gen: Well-appearing patient, appropriate, interactive, in no acute distress  Neuro: Alert and oriented, answers questions appropriately  {PE extent:75514}    location Abscess Inflamed nodule Non-inflamed nodule Draining sinus Non-draining Sinus Hurley % scar   R axilla          L axilla          R inframammary          L inframammary          Intermammary          Pubic          R inguinal          R thigh          L inguinal          L thigh          Scrotum/Vulva          Perianal          R buttock          L buttock          Other (list)                      AN count (total sum of abscess and inflammatory nodule): ***  Pilonidal sinus? Yes, no, or previously excised ***    - All sites not commented on demonstrate normal findings.    Scribe's Attestation: Elsie Stain, MD obtained and performed the history, physical exam and medical decision making elements that were entered into the chart.  Signed by Cleda Mccreedy, Scribe, on ***.    {*** NOTE TO PROVIDER: PLEASE ADD ATTESTATION NOTING YOU AGREE WITH SCRIBE DOCUMENTATION}

## 2023-07-05 NOTE — Unmapped (Signed)
[facility] Refill Coordination Note    Specialty Medication(s) to be Shipped:   Inflammatory Disorders: Cosentyx    Other medication(s) to be shipped: No additional medications requested for fill at this time     Teresa Bowman, DOB: 1981/09/24  Phone: There are no phone numbers on file.      All above HIPAA information was verified with patient.     Was a Nurse, learning disability used for this call? No    Completed refill call assessment today to schedule patient's medication shipment from the [facility] [phone]).  All relevant notes have been reviewed.     Specialty medication(s) and dose(s) confirmed: Regimen is correct and unchanged.   Changes to medications: Teresa Bowman reports no changes at this time.  Changes to insurance: No  New side effects reported not previously addressed with a pharmacist or physician: None reported  Questions for the pharmacist: No    Confirmed patient received a Conservation officer, historic buildings and a Surveyor, mining with first shipment. The patient will receive a drug information handout for each medication shipped and additional FDA Medication Guides as required.       DISEASE/MEDICATION-SPECIFIC INFORMATION        For patients on injectable medications: Patient currently has 0 doses left.  Next injection is scheduled for 07/11/23.    SPECIALTY MEDICATION ADHERENCE     Medication Adherence    Patient reported X missed doses in the last month: 0  Specialty Medication: secukinumab (COSENTYX UNOREADY PEN) 300 mg/2 mL (150 mg/mL) PnIj  Patient is on additional specialty medications: No              Were doses missed due to medication being on hold? No     COSENTYX UNOREADY PEN 300 mg/2 mL (150 mg/mL) Pnij (secukinumab): 0 days of medicine on hand     REFERRAL TO PHARMACIST     Referral to the pharmacist: Not needed      [facility]     Shipping address confirmed in Epic.       Delivery Scheduled: Yes, Expected medication delivery date: 07/11/23.     Medication will be delivered via UPS to the prescription address in Epic WAM.    [name]   [facility] Specialty Technician

## 2023-07-10 MED FILL — COSENTYX UNOREADY PEN 300 MG/2 ML (150 MG/ML) SUBCUTANEOUS: SUBCUTANEOUS | 28 days supply | Qty: 2 | Fill #3

## 2023-07-16 NOTE — Unmapped (Signed)
Dermatology Note    Assessment and Plan:      Hidradenitis Suppurativa, Hurley stage II - chronic, flaring today in groin  Failed adalimumab 80mg  weekly.   - We discussed the typical natural history, pathogenesis, treatment options, and expected course as well as the relapsing and sometimes recalcitrant nature of the disease.    - Discussed chronic, relapsing nature of disease and treatment options, including antibiotics, immune modulators, surgery, and laser.  - Continue secukinumab (COSENTYX UNOREADY PEN) 300 mg/2 mL (150 mg/mL) PnIj; Inject 300 mg under the skin every twenty-eight (28) days.  - Continue spironolactone 100 mg daily, R/A/B reviewed  - Continue triamcinolone 0.1% cream BID to affected area for up to 1 week at a time as needed for flares. Appropriate use and side effects of topical steroids discussed.  - Continue clindamycin (CLEOCIN T) 1 % lotion; Apply topically two (2) times a day. Apply to HS as needed until it clears  - Continue doxycycline 100 mg BID for flares  - Discussed potential surgeries if not improved in the future    After the patient was informed of risks, benefits and side effects of intralesional steroid injection, the patient elected to undergo injection. Informed verbal consent was obtained. Risk of atrophy  and dyspigmentation with injection was explained. Kenalog 40 mg/ml was injected locally into the sites located L lower buttocks crease in a clean fashion following alcohol prep.   Total volume in ml=0.2.  Number of sites treated: 1   Wound care was explained to the patient     Atopic dermatitis  - Diagnosis, treatment options, prognosis, risk/ benefit, and side effects of treatment were discussed with the patient.   - Start triamcinolone 0.1% cream twice daily to flares on back.    The patient was advised to call for an appointment should any new, changing, or symptomatic lesions develop.     RTC: Return in about 4 months (around 11/16/2023) for follow up of HS and eczema. or sooner as needed   _________________________________________________________________      Chief Complaint     Chief Complaint   Patient presents with    [facility]     Groin area no flares but has a area of concern that is sore wants injection     HPI     Teresa Bowman. Wean is a 42 y.o. female who presents as a returning patient (last seen by Dr. Lavella Lemons and Dr. Orma Flaming on 03/27/2023) to Dermatology for follow up of hidradenitis suppurativa. At last visit, patient was to continue Cosentyx as prescribed, spironolactone 100 mg daily, triamcinolone 0.1% cream, clindamycin 1% lotion, and doxycycline 100 mg twice daily for HS. She also underwent a punch excision procedure.  This site has done well.      Today, she reports improvement in her HS. She notes that lesions may be spreading down her legs. She endorses tenderness when sitting on her left side. Currently using Cosentyx with improvement.     She additionally notes several small lesions on her back. Endorses pruritus has worsened recently.    The patient denies any other new or changing lesions or areas of concern.     Pertinent Past Medical History     Past Medical History, Family History, Social History, Medication List, Allergies, and Problem List were reviewed in the rooming section of Epic.     ROS: Other than symptoms mentioned in the HPI, no fevers, chills, or other skin complaints    Physical Examination  Gen: Well-appearing patient, appropriate, interactive, in no acute distress  SKIN (Focal Skin Exam): Per patient request, examination of bilateral axillae, chest, abdomen, bilateral thighs, groin, buttocks, and external genitalia was performed  - Slight tenderness at L lower buttock crease underlying superficial atrophy  - eczematous plaques on upper back     -sites not commented on demonstrate normal findings.    Scribe's Attestation: Elsie Stain, MD obtained and performed the history, physical exam and medical decision making elements that were entered into the chart.  Signed by Sharol Harness, Scribe, on July 17, 2023 at 1:23 PM.    ----------------------------------------------------------------------------------------------------------------------  July 17, 2023 5:33 PM. Documentation assistance provided by the Scribe. I was present during the time the encounter was recorded. The information recorded by the Scribe was done at my direction and has been reviewed and validated by me.  ----------------------------------------------------------------------------------------------------------------------       (Approved Template 08/03/2020)

## 2023-07-16 NOTE — Unmapped (Signed)
- Continue Cosentyx 300 mg/2 mL. Inject 300 mg under the skin every 28 days.  - Take 1 tablet of spironolactone 100 mg daily  - Apply triamcinolone 0.1% cream twice daily to affected area for up to 1 week at a time as needed for flares.   - Apply clindamycin lotion topically two times a day to HS as needed until it clears  - Take doxycycline 100 mg twice daily for flares    You are welcome to join the [facility] for HS of the [employer] group online at [email or web address] or in-person at our regular meetings.  You can textHS to [phone] for meeting reminders or join the group online for regular updates.  This can be a great opportunity to interact and learn from other patients and help work with the HS community.  We hope to see your there!    Hidradentis Suppurativa (pronounced ???high-drad-en-eye-tis/sup-your-uh-tee-vah???) is a chronic disease of hair follicles.  The lesions occur most commonly on areas of skin-to-skin contact: under the arms (axillary area), in the groin, around the buttocks, in the region around the anus and genitals, and on the skin between and under the breasts. In women, the underarms, groin, and breast areas are most commonly affected. Men most often have HS lesions on the buttocks and under the arms and may also have HS at the back of the neck and behind and around the ears.    What does HS look and feel like?   The first thing that someone with HS notices is a tender, raised, red bump that looks like an under-the-skin pimple or boil. Sometimes HS lesions have two or more ???heads.???  In mild disease only an occasional boil or abscess may occur, but in more active disease there can be many new lesions every month.  Some abscesses can become larger and may open and drain pus.  Bleeding and increased odor can also occur. In severe disease, deeper abscesses develop and may connect with each other under the skin to form tunnel-like tracts (sinuses, fistulas).  These may drain constantly, or may temporarily improve and then usually begin draining again over time.  In people who have had sinus tracts for some time, scars form that feel like ropes under the skin. In the very worst cases, networks of sinus tracts can form deeper in the body, including the muscle and other tissues. Many people with severe HS have scars that can limit their ability to freely move their arms or legs, though this is very unlikely for most patients.     Clinicians usually classify or ???grade??? HS using the [facility] staging system according to the severity of the disease for each body location:   [place] stage I: one or more abscesses are present, but no sinus tracts have formed and no scars have developed   Doreene Adas stage II: one or more abscesses are present that resolve and recur; on sinus tract can be present and scarring is seen   Doreene Adas stage III: many abscesses and more than one sinus tract is present with extensive scars.    What causes HS?  The cause of HS is not completely understood.  It seems to be a disorder of hair follicles and often many family members are affected so genetics probably play a strong role.  Bacteria are often present and may make the disease worse, but infection does not seem to be the main cause. Hormones are also likely play a role since the condition typically starts around  puberty when hair follicles under the arms and in the groin start to change.  It can sometimes flare with menstrual cycles in women as well.  In most cases it lasts for decades and starts to improve to some extent in the late 30s and 40s as long as many fistulas have not already formed.  Women are three times more likely than men to develop HS.    Other factors are known to contribute to HS flaring or becoming worse, though they are likely not the main causes. The factors most commonly associated with HS include:   Cigarette smoking - Stopping smoking will likely not cure the disease, but likely is helpful in reducing how much and how often it flares and may prevent it from getting as bad over time.   Higher weight - HS may occur even in people that are not overweight, but it is much more common in patients that are.  There is some evidence that losing weight and eating a diet low in sugars and fats may be helpful in improving hidradenitis, though this is not helpful for everyone.  Working with a nutritionist may be an important way to help with this and is something your physician can help coordinate    Hidradenitis is not contagious.  It is not caused by a problem with personal hygiene or any other activity or behavior of those with the disease.    How can your doctor help you treat your hidradenitis?  Clinicians use both medication and surgery to treat HS. The choice of treatment--or combination of treatments--is made according to an individual patient???s needs. Clinicians consider several factors in determining the most appropriate plan for therapy:   Severity of disease - medications and some laser treatments are usually able to control disease best when fistulas are not present.  Fistulas typically require surgery.   Extent and location of disease   Chronicity (how often the lesions recur)    A number of different surgical methods have been developed that are useful for certain patients under particular circumstances. These can be done with local numbing and healing at home for some areas when disease is not too extensive with relatively brief recovery times.  In more extensive disease there may be a need for larger excisions under general anesthesia with healing time in the hospital and prolonged recovery periods for better disease control.      In addition, many medical treatments have been tried--some with more success than others. No medication is effective for all patients, and you and your doctor may have to try several different treatments or combinations of treatments before you find the treatment plan that works best for you.  The goals of therapy with medications that are either topical (used on the skin) or systemic (taken by mouth) are:  1. to clear the lesions or at least reduce their number and extent, and  2. to prevent new lesions from forming.  3. To reduce pain, drainage, and odor  Some of the types of medications commonly used are antibacterial skin washes and the topical antibiotics to prevent secondary infections and corticosteroid injections into the lesions to reduce inflammation.     Other medications that may be used include retinoids (similar to Accutane), drugs that effect how hormones and hair follicles interact, drugs that affect your immune system (such as methotrexate, adalimumab/Humira, and Remicaid/infliximab), steroids, and oral antibiotics.    Lasers that destroy hair follicles can also be helpful since they reduce the hair follicles  that cause the problems.  Multiple treatments are typically required over time and there is some discomfort associated with treatment, but it is typically very fast and well-tolerated.    It is very important to realize that hidradenitis cannot usually be completely cured with any single medication or surgical procedure.  It is a disease that can be very stubborn and difficult to control, but with good treatment a lot of improvement and sometimes temporary remissions can be obtained. Poorly controlled disease can cause more fistulas to form and make managing the disease much more difficult over time so it is important to seek care to reduce major flares.  Surgery can provide a long term cure in some areas, though the disease can start again or continue in nearby areas.  A dermatologist is often the best person to help coordinate disease treatment, and sometimes other surgeons, pain specialists, other specialists, and nutritionists may be part of the treatment team.    For severe disease, the first goal is often to reduce pain and symptoms with medicines so that the disease feels more stable. Once it's stable, we often start thinking about how to address areas that have completely gotten better with surgery if they are still causing problems.    What can you do to help your HS?  1. Stopping smoking is hard and may not fix everything, but it may be a step in the right direction.  We or your primary care physician can provide resources to help stop if you are interested.  2. Follow a healthy diet and try to achieve a healthy weight.  Some other self-help measures are:   Keep your skin cool and dry (becoming overheated and sweating can contribute to an HS flare)   To reduce the pain of cysts or nodules or to help them to drain, apply hot compresses or soak in hot water for 10 minutes at a time (use a clean washcloth or a teabag soaked in hot water)   For female patients, cotton underwear that does not have tight elastic in the groin can be helpful.  Boyshort, brief, or boxer style underwear may be a better option as friction on hair follicles in affected areas can be a major trigger in some patients.  These can be easily found on [place] or with some retailers.  Fruit of the Loom and Underworks are two brands that are sometimes recommended.    Finally, know that you are not alone. Coping with the pain and other symptoms of HS can be very difficult, so it may be helpful to connect with others who live with HS. Patient groups and networks can be sources of important information and support. Some internet resources for information and connections are provided below.    Psychologytoday.com is a resource to find psychologists and therapists that can help support you in your are     [email or web address] can help connect with sexual health resources and counselors    Resources for Information    The Hidradenitis Suppurativa Foundation: A nonprofit organized by a group of physicians interested in treating and advancing research in hidradenitis suppurativa.  This group advocates for better care and research for hidradenitis and has educational materials put together specifically for patients that have been reviewed and produced by doctors and people with hidradenitis.    American Academy of Dermatology  ARanked.fi    [employer]  ElevatorPitchers.de.html  [employer]: [employer] for [employer]  https://www.rarediseases.org/rare-disease-information/rare-diseases/byID/358/viewAbstract  Trials of new medications for  HS  Https://www.clinicaltrials.gov

## 2023-07-17 ENCOUNTER — Ambulatory Visit: Admit: 2023-07-17 | Discharge: 2023-07-18 | Payer: PRIVATE HEALTH INSURANCE

## 2023-07-17 DIAGNOSIS — L732 Hidradenitis suppurativa: Principal | ICD-10-CM

## 2023-07-17 DIAGNOSIS — L209 Atopic dermatitis, unspecified: Principal | ICD-10-CM

## 2023-07-17 MED ORDER — TRIAMCINOLONE ACETONIDE 0.1 % TOPICAL CREAM
Freq: Two times a day (BID) | TOPICAL | 2 refills | 0.00000 days | Status: CP
Start: 2023-07-17 — End: 2024-07-16

## 2023-07-17 MED ADMIN — triamcinolone acetonide (KENALOG-40) injection 40 mg: 40 mg | INTRALESIONAL | @ 22:00:00 | Stop: 2023-07-17

## 2023-08-04 NOTE — Unmapped (Signed)
[facility] Refill Coordination Note    Teresa Bowman, [place]: [birth year]  Phone: There are no phone numbers on file.      All above HIPAA information was verified with patient.         08/03/2023     2:18 PM   Specialty Rx Medication Refill Questionnaire   Which Medications would you like refilled and shipped? COSENTYX   Please list all current allergies: Ceclor   Have you missed any doses in the last 30 days? No   Have you had any changes to your medication(s) since your last refill? No   How many days remaining of each medication do you have at home? 0   If receiving an injectable medication, next injection date is 08/08/2023   Have you experienced any side effects in the last 30 days? No   Please enter the full address (street address, city, state, zip code) where you would like your medication(s) to be delivered to. [address] [name] [address]   Please specify on which day you would like your medication(s) to arrive. Note: if you need your medication(s) within 3 days, please call the pharmacy to schedule your order at [phone]  08/08/2023   Has your insurance changed since your last refill? No   Would you like a pharmacist to call you to discuss your medication(s)? No   Do you require a signature for your package? (Note: if we are billing Medicare Part B or your order contains a controlled substance, we will require a signature) No         Completed refill call assessment today to schedule patient's medication shipment from the [facility] [phone]).  All relevant notes have been reviewed.       Confirmed patient received a Conservation officer, historic buildings and a Surveyor, mining with first shipment. The patient will receive a drug information handout for each medication shipped and additional FDA Medication Guides as required.         REFERRAL TO PHARMACIST     Referral to the pharmacist: Not needed      [facility]     Shipping address confirmed in Epic.     Delivery Scheduled: Yes, Expected medication delivery date: 08/08/23 .     Medication will be delivered via UPS to the prescription address in Epic WAM.    Ricci Barker   [facility] Specialty Technician

## 2023-08-07 MED FILL — COSENTYX UNOREADY PEN 300 MG/2 ML (150 MG/ML) SUBCUTANEOUS: SUBCUTANEOUS | 28 days supply | Qty: 2 | Fill #4

## 2023-08-31 NOTE — Unmapped (Signed)
[facility] Refill Coordination Note    Teresa Bowman, [place]: 06/12/1981  Phone: There are no phone numbers on file.      All above HIPAA information was verified with patient.         08/30/2023     3:21 PM   Specialty Rx Medication Refill Questionnaire   Which Medications would you like refilled and shipped? Cosentyx   Please list all current allergies: Ceclor antibiotic   Have you missed any doses in the last 30 days? No   Have you had any changes to your medication(s) since your last refill? No   How many days remaining of each medication do you have at home? 0   If receiving an injectable medication, next injection date is 09/05/2023   Have you experienced any side effects in the last 30 days? No   Please enter the full address (street address, city, state, zip code) where you would like your medication(s) to be delivered to. [address] [name] [address]   Please specify on which day you would like your medication(s) to arrive. Note: if you need your medication(s) within 3 days, please call the pharmacy to schedule your order at [phone]  09/04/2023   Has your insurance changed since your last refill? No   Would you like a pharmacist to call you to discuss your medication(s)? No   Do you require a signature for your package? (Note: if we are billing Medicare Part B or your order contains a controlled substance, we will require a signature) No         Completed refill call assessment today to schedule patient's medication shipment from the [facility] [phone]).  All relevant notes have been reviewed.       Confirmed patient received a Conservation officer, historic buildings and a Surveyor, mining with first shipment. The patient will receive a drug information handout for each medication shipped and additional FDA Medication Guides as required.         REFERRAL TO PHARMACIST     Referral to the pharmacist: Not needed      [facility]     Shipping address confirmed in Epic.     Delivery Scheduled: Yes, Expected medication delivery date: 10/15.     Medication will be delivered via UPS to the prescription address in Epic WAM.    [name]

## 2023-09-04 MED FILL — COSENTYX UNOREADY PEN 300 MG/2 ML (150 MG/ML) SUBCUTANEOUS: SUBCUTANEOUS | 28 days supply | Qty: 2 | Fill #5

## 2023-09-12 ENCOUNTER — Ambulatory Visit: Admit: 2023-09-12 | Discharge: 2023-09-13 | Payer: PRIVATE HEALTH INSURANCE

## 2023-09-12 DIAGNOSIS — Z79899 Other long term (current) drug therapy: Principal | ICD-10-CM

## 2023-09-12 DIAGNOSIS — L732 Hidradenitis suppurativa: Principal | ICD-10-CM

## 2023-09-12 DIAGNOSIS — L209 Atopic dermatitis, unspecified: Principal | ICD-10-CM

## 2023-09-12 MED ORDER — COSENTYX UNOREADY PEN 300 MG/2 ML (150 MG/ML) SUBCUTANEOUS
SUBCUTANEOUS | 10 refills | 0.00000 days | Status: CP
Start: 2023-09-12 — End: ?
  Filled 2023-09-18: qty 4, 28d supply, fill #0

## 2023-09-12 MED ADMIN — triamcinolone acetonide (KENALOG-40) injection 40 mg: 40 mg | INTRALESIONAL | @ 21:00:00 | Stop: 2023-09-12

## 2023-09-12 NOTE — Unmapped (Signed)
Self-reported severity (0-5): 3  In the past 7 days, how much has HS affected your quality of life (choose one)? Extremely  VAS pain today: 7  VAS average pain for the last month: 2  Requiring pain medication? Yes.  If so, what type/frequency? Duloxetine [place]en  How often in pain?  few times a day  Level of odor (0-5): 3  Level of itching (0-5): 0  Dressing changes needed for drainage:Several times a day  How much drainage: some drainage  Flare in the last month (Y/N)? Yes.  How long ago was the last flare? in last 6 months  Developing new lesions? less than monthly  Number of inflammatory lesions montly: 1-3  DLQI: 27  Current treatment: Cosentyx/spirinolactone     How helpful is the current treatment in managing the following aspects of your disease?  Not at all helpful Somewhat helpful Very helpful   Pain   x   Decreasing length of flares   x   Decreasing new lesions   x   Drainage      Decreasing frequency of flares   x   Decreasing severity of flares   x   Odor        Vaping History    Do you currently OR do you have a history of using vaping devices or E-cigarettes that contain nicotine? Former Advertising copywriter long have you OR had you vaped/used a nicotine e-cigarette for? 1 years  How often do you currently or did you in the past use an e-cigarette or vaping device? Daily or almost daily  How many cartridges do you/did you previously consume in an average month? Not answered  What size are/were the cartridges (refillable/single use) you use/used? Not answered  Did you start vaping as an alternative to cigarette smoking? NO, using BOTH vapes and cigarettes  How long before or after you started vaping did your HS symptoms arise for the first time? More than 2 years before first vaping  How has vaping affected your HS? Unclear    Cannabis Use History     SELECT YES HERE IF PATIENT DECLINED TO ANSWER QUESTIONS ABOUT CANNABIS USE No.    Do you currently OR do you have a history of using cannabis/Marijuana/THC? Former user  How long have you OR had you used cannabis/Marijuana/THC for? 12 years  How often do you currently or did you in the past use cannabis/Marijuana/THC? Daily or almost daily  Which of the following methods do you use to consume cannabis (select all that apply)? Joints/Blunts and Hand Pipe  How many grams of marijuana do you (or did you) use in a typical week? Unsure  If using edibles, how many milligrams of THC do you normally ingest per session? Not answered milligrams? Or unsure?  Not answered  How long before or after you started using cannabis did your HS symptoms arise for the first time? More than 2 years before first cannabis use  How has consuming cannabis affected your HS? Unclear  On a scale of 0-10, how has consuming cannabis/marijuana/THC impacted your pain from HS? (with 0 being no improvement at all and 10 being completely alleviates the pain) not answered

## 2023-09-12 NOTE — Unmapped (Signed)
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   - We discussed the typical natural history, pathogenesis, treatment options, and expected course as well as the relapsing and sometimes recalcitrant nature of the disease.    - Discussed chronic, relapsing nature of disease and treatment options, including antibiotics, immune modulators, surgery, and laser.  - Increase Cosentyx from 300mg  every 28 days to 300mg  every 2 weeks   - secukinumab (COSENTYX UNOREADY PEN) 300 mg/2 mL (150 mg/mL) PnIj; Inject 300 mg under the skin every fourteen (14) days.  - Continue spironolactone 100 mg daily, R/A/B reviewed  - Continue triamcinolone 0.1% cream BID to affected area for up to 1 week at a time as needed for flares. Appropriate use and side effects of topical steroids discussed.  - Continue clindamycin (CLEOCIN T) 1 % lotion; Apply topically two (2) times a day. Apply to HS as needed until it clears  - Continue doxycycline 100 mg BID for flares  - Proceed with intra lesional kenalog as below:    Intralesional Kenalog Procedure Note: After the patient was informed of risks (including atrophy and dyspigmentation), benefits and side effects of intralesional steroid injection, the patient elected to undergo injection and verbal consent was obtained. Skin was cleaned with alcohol and injected intralesionally into the sites (below). The patient tolerated the procedure well without complications and was instructed on post-procedure care.  Location(s): R perianal area  Number of sites treated: 1  Kenalog (triamcinolone) Concentration: 40 mg/ml   Volume: 0.2 ml total    High risk medication use, Cosentyx  - quant gold negative 03/01/23      The patient was advised to call for an appointment should any new, changing, or symptomatic lesions develop.     RTC: Return for Next scheduled follow up. or sooner as needed _________________________________________________________________      Chief Complaint     HS      HPI     Teresa Bowman is a 42 y.o. female who presents as a returning patient (last seen 07/17/2023) to Dermatology for follow up of HS.     Today patient reports:  - she is currently flaring near her R rectum, started 3 days ago  - taking secukinumab 300 mg under the skin every twenty-eight (28) days.  - taking spironolactone 100 mg daily  - taking triamcinolone 0.1% cream BID to affected area for up to 1 week at a time as needed for flares.  - taking clindamycin (CLEOCIN T) 1 % lotion    The patient denies any other new or changing lesions or areas of concern.     Pertinent Past Medical History     Past Medical History, Family History, Social History, Medication List, Allergies, and Problem List were reviewed in the rooming section of Epic.     ROS: Other than symptoms mentioned in the HPI, no fevers, chills, or other skin complaints    Physical Examination     GENERAL: Well-appearing female in no acute distress, resting comfortably.  NEURO: Alert and oriented, answers questions appropriately  SKIN: Examination of the buttocks, genitalia, and groin was performed    -1 IN R labia, 1 IN R perianal area    All areas not commented on are within normal limits or unremarkable    (Approved Template 08/03/2020)

## 2023-09-13 NOTE — Unmapped (Signed)
Clinical Assessment Needed For: Dose Change  Medication: COSENTYX UNOREADY PEN 300 mg/2 mL (150 mg/mL) Pnij (secukinumab)  Last Fill Date/Day Supply: 09/03/23 / 28 days  Copay $4  Was previous dose already scheduled to fill: No    Notes to Pharmacist:

## 2023-09-13 NOTE — Unmapped (Signed)
I saw and evaluated the patient, participating in the key elements of the service.  I discussed the findings, assessment and plan with the resident and agree with resident???s findings and plan as documented in the resident's note.  I was immediately available for the entirety of the procedure(s) and present for the key and critical portions. Leonette Nutting, MD

## 2023-09-14 NOTE — Unmapped (Signed)
[facility] Pharmacist has reviewed a new prescription for Cosentyx that indicates a dose increase.  Patient was counseled on this dosage change by DD- see epic note from 10/22.  Next refill call date adjusted if necessary.      *I left VM and sent mychart to inform patient the higher dosing was approved. Old rx has been discontinued.    Tyller Bowlby A. Katrinka Blazing, PharmD, BCPS - Clinical Pharmacist   [facility]    [address], [place], [place] [address]  t [phone], opt 4 then 2 - f [phone]

## 2023-10-17 NOTE — Unmapped (Signed)
[facility] Refill Coordination Note    Specialty Medication(s) to be Shipped:   Inflammatory Disorders: Cosentyx    Other medication(s) to be shipped: No additional medications requested for fill at this time     Teresa Bowman, DOB: Sep 27, 1981  Phone: There are no phone numbers on file.      All above HIPAA information was verified with patient.     Was a Nurse, learning disability used for this call? No    Completed refill call assessment today to schedule patient's medication shipment from the [facility] [phone]).  All relevant notes have been reviewed.     Specialty medication(s) and dose(s) confirmed: Regimen is correct and unchanged.   Changes to medications: Taya reports no changes at this time.  Changes to insurance: No  New side effects reported not previously addressed with a pharmacist or physician: None reported  Questions for the pharmacist: No    Confirmed patient received a Conservation officer, historic buildings and a Surveyor, mining with first shipment. The patient will receive a drug information handout for each medication shipped and additional FDA Medication Guides as required.       DISEASE/MEDICATION-SPECIFIC INFORMATION        For patients on injectable medications: Patient currently has 0 doses left.  Next injection is scheduled for 10/24/23.    SPECIALTY MEDICATION ADHERENCE     Medication Adherence    Patient reported X missed doses in the last month: 0  Specialty Medication: COSENTYX UNOREADY PEN 300 mg/2 mL  Patient is on additional specialty medications: No  Patient is on more than two specialty medications: No  Any gaps in refill history greater than 2 weeks in the last 3 months: no  Demonstrates understanding of importance of adherence: yes              Were doses missed due to medication being on hold? No     COSENTYX UNOREADY PEN 300 mg/2 mL (150 mg/mL) Pnij (secukinumab): 0 days of medicine on hand     REFERRAL TO PHARMACIST     Referral to the pharmacist: Not needed      [facility]     Shipping address confirmed in Epic.       Delivery Scheduled: Yes, Expected medication delivery date: 10/24/23.     Medication will be delivered via UPS to the prescription address in Epic WAM.    Ricci Barker   [facility] Specialty Technician

## 2023-10-23 MED FILL — COSENTYX UNOREADY PEN 300 MG/2 ML (150 MG/ML) SUBCUTANEOUS: SUBCUTANEOUS | 28 days supply | Qty: 4 | Fill #1

## 2023-10-24 DIAGNOSIS — L732 Hidradenitis suppurativa: Principal | ICD-10-CM

## 2023-10-24 MED ORDER — DOXYCYCLINE HYCLATE 100 MG CAPSULE
ORAL_CAPSULE | 1 refills | 0 days | Status: CP
Start: 2023-10-24 — End: ?

## 2023-10-27 DIAGNOSIS — L732 Hidradenitis suppurativa: Principal | ICD-10-CM

## 2023-10-27 MED ORDER — DOXYCYCLINE HYCLATE 100 MG CAPSULE
ORAL_CAPSULE | 1 refills | 0 days | Status: CP
Start: 2023-10-27 — End: ?

## 2023-10-27 NOTE — Unmapped (Signed)
Fax received requesting refill for medication Doxycycline.    LOV: 09/12/2023    Medication pended for review.

## 2023-11-04 ENCOUNTER — Ambulatory Visit
Admit: 2023-11-04 | Discharge: 2023-11-04 | Disposition: A | Discharge status: Home / Self Care | Payer: PRIVATE HEALTH INSURANCE | Attending: Family

## 2023-11-04 DIAGNOSIS — T7840XA Allergy, unspecified, initial encounter: Principal | ICD-10-CM

## 2023-11-04 MED ORDER — EPINEPHRINE 0.3 MG/0.3 ML INJECTION, AUTO-INJECTOR
Freq: Once | INTRAMUSCULAR | 0 refills | 1.00 days | Status: CP
Start: 2023-11-04 — End: 2023-11-04

## 2023-11-04 MED ADMIN — famotidine (PF) (PEPCID) injection 20 mg: 20 mg | INTRAVENOUS | @ 18:00:00 | Stop: 2023-11-04

## 2023-11-04 MED ADMIN — methylPREDNISolone sodium succinate (PF) (SOLU-Medrol) injection 125 mg: 125 mg | INTRAVENOUS | @ 18:00:00 | Stop: 2023-11-04

## 2023-11-04 MED ADMIN — EPINEPHrine (EPIPEN) injection 0.3 mg: .3 mg | INTRAMUSCULAR | @ 18:00:00 | Stop: 2023-11-04

## 2023-11-04 MED ADMIN — diphenhydrAMINE (BENADRYL) injection 25 mg: 25 mg | INTRAVENOUS | @ 18:00:00 | Stop: 2023-11-04

## 2023-11-04 NOTE — Unmapped (Signed)
[facility]  Emergency Department Provider Note     ED Clinical Impression     Final diagnoses:   Allergic reaction, initial encounter (Primary)      Impression, Medical Decision Making, ED Course     HPI/MDM: 42 y.o. 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), Depression (10/20/2021), Diabetes mellitus (CMS-HCC) (2024), Disorder of skin or subcutaneous tissue, Dysplastic nevi (2012), Fibromyalgia, Hidradenitis suppurativa (03/14/2022), 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), Insomnia, Inverted nipple, Leukocytosis (03/24/2022), 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 who presents with acute allergic reaction.  Patient reports that she was seen at urgent care, diagnosed with strep and given azithromycin.  After first dose of azithromycin patient reported tongue swelling and throat closing as well as lower lip swelling.  Drove herself to the emergency department.  She was given initial epinephrine injection in triage.  On initial exam patient is in no acute respiratory distress at this time.  There is no notable angioedema, 1+ bilateral tonsillar hypertrophy, no tongue swelling, no noticeable voice changes, no stridor, no tripoding, lungs clear bilateral to auscultation, handling secretions appropriately.  No further epinephrine administered, methylprednisolone IV given with Pepcid plus diphenhydramine.    Patient has remained hemodynamically stable throughout course of the emergency department visit, no rebound or refractory symptoms.  Hemodynamically stable, oxygenating well, no evidence of progression of angioedema or acute allergic reaction symptoms.  Blood pressure is normotensive, heart rate is 95, respiratory rate stable at 16, saturations are 98% on room air.  Afebrile.    Patient observed for over 4 hours in the emergency department and at this time is hemodynamically stable without signs of progressive symptoms and appropriate for discharge.  Detailed red flag return precaution discussed with patient.  Plan for prescription for new epinephrine pen.  She will follow-up with PCP for further evaluation.  Return to the emergency department for further signs or symptoms of acute allergic reaction.           Strict return precautions were reviewed with the patient. The patient was advised to return to the emergency department with any troubling symptoms. I advised the patient to follow up with her primary care doctor within one week for re-evaluation and to assure improvement in symptoms. The patient is comfortable with this plan and will be discharged.    History     Chief Complaint  Chief Complaint   Patient presents with    Allergic Reaction         Past Medical History:   Diagnosis Date    Abnormal Pap smear of cervix     ?2002    Acne 1999    Acute venous embolism and thrombosis of other specified veins(453.89)     ovarian vein thrombosis    Allergic 2012    Allergic rhinitis     Anxiety  Anxiety     Asthma mild    Atopic dermatitis 03/24/2022    Bilateral low back pain with left-sided sciatica 10/06/2014    Blood coagulation disorder (CMS-HCC) 03/24/2022    Chronic pain syndrome     due to lumbar disk disease    Chronic sinusitis 03/24/2022    Clotting disorder (CMS-HCC)     Decreased libido     Delayed sleep phase syndrome 03/24/2022    Depression 10/20/2021    Diabetes mellitus (CMS-HCC) 2024    Disorder of skin or subcutaneous tissue     Dysplastic nevi 2012    On back, right shoulder blade    Fibromyalgia     Hidradenitis suppurativa 03/14/2022    Inflammatory polyarthropathy (CMS-HCC) 03/24/2022    Insomnia     Inverted nipple     left nipple inverts x several years    Leukocytosis 03/24/2022    Long-term use of hydroxychloroquine 10/20/2021    Low back pain 03/24/2022    Lumbar radiculopathy 03/24/2022    Mild intermittent asthma without complication 12/21/2018    Mixed incontinence 05/12/2014    Last Assessment & Plan:   Formatting of this note might be different from the original.  Obese.  Failed Vesicare due to side effects profile.     June 2015: Weight loss.  Trial of Myrbetriq 25 mg daily.     July 2015: Significant clinical improvement.  4 weeks samples with new prescription.    Mixed stress and urge urinary incontinence 03/24/2022    Morbid obesity (CMS-HCC)     Numbness     left side    Obesity     Pain in thoracic spine at multiple sites 10/06/2014    Spontaneous vaginal delivery     Suspected UTI 03/03/2022    Trochanteric bursitis of left hip 03/24/2022    Urinary frequency     Urinary incontinence     Venous insufficiency     Vitamin D deficiency 01/22/2018    Weakness     left side       Past Surgical History:   Procedure Laterality Date    CHOLECYSTECTOMY      CRYOTHERAPY      cervix    PR EXCISION TURBINATE,SUBMUCOUS Bilateral 09/16/2017    Procedure: BILATERAL MAXILLARY ANTROSTOMIES;  Surgeon: Meryle Ready, MD;  Location: OR ;  Service: ENT    Resection of hidaradentits TONSILLECTOMY           Current Facility-Administered Medications:     triamcinolone acetonide (KENALOG-40) injection 40 mg, 40 mg, Intralesional, Once, Elsie Stain, MD    Current Outpatient Medications:     baclofen (LIORESAL) 10 MG tablet, Take 1 tablet (10 mg total) by mouth Three (3) times a day., Disp: , Rfl:     clindamycin (CLEOCIN T) 1 % lotion, Apply topically two (2) times a day. Apply to HS as needed until it clears, Disp: 60 mL, Rfl: 11    clobetasoL (TEMOVATE) 0.05 % ointment, Twice daily as needed for spot treatment for up to one week on each spot, Disp: 60 g, Rfl: 5    doxycycline (VIBRAMYCIN) 100 MG capsule, Twice daily for 2 weeks for flare, Disp: 90 capsule, Rfl: 1    DULoxetine (CYMBALTA) 60 MG capsule, TAKE ONE CAPSULE BY MOUTH ONE TIME DAILY, Disp: 30 capsule, Rfl: 0    empty container Misc, Use as directed to dispose of Humira Pens, Disp: 1 each, Rfl: 3    EPINEPHrine (EPIPEN) 0.3  mg/0.3 mL injection, Inject 0.3 mL (0.3 mg total) into the muscle once for 1 dose., Disp: 1 each, Rfl: 0    erythromycin (ROMYCIN) 5 mg/gram (0.5 %) ophthalmic ointment, Administer to the right eye Three (3) times a day., Disp: 3.5 g, Rfl: 0    melatonin 3 mg Tab, Take 1 tablet (3 mg total) by mouth every evening., Disp: 30 tablet, Rfl: 11    meloxicam (MOBIC) 15 MG tablet, TAKE 1 TABLET BY MOUTH DAILY, Disp: 90 tablet, Rfl: 3    miconazole (MICOTIN) 2 % cream, Apply topically two (2) times a day., Disp: 28.35 g, Rfl: 3    multivitamin (THERAGRAN) per tablet, Take 1 tablet by mouth daily., Disp: , Rfl:     secukinumab (COSENTYX UNOREADY PEN) 300 mg/2 mL (150 mg/mL) PnIj, Inject the contents of 1 pen (300mg ) under the skin weekly for 5 weeks as loading doses (on weeks 0,1,2,3,4), Disp: 10 mL, Rfl: 0    secukinumab (COSENTYX UNOREADY PEN) 300 mg/2 mL (150 mg/mL) PnIj, Inject the contents of 1 pen (300 mg) under the skin every fourteen (14) days., Disp: 4 mL, Rfl: 10    spironolactone (ALDACTONE) 100 MG tablet, Take 1 tablet (100 mg total) by mouth daily., Disp: 90 tablet, Rfl: 3    triamcinolone (KENALOG) 0.1 % cream, Twice daily for up to 2 weeks for healing areas, Disp: 80 g, Rfl: 2    triamcinolone (KENALOG) 0.1 % cream, Apply topically two (2) times a day., Disp: 453.6 g, Rfl: 2    Allergies  Naltrexone, Augmentin [amoxicillin-pot clavulanate], Ceclor [cefaclor], and Penicillins    Family History  Family History   Problem Relation Age of Onset    Diabetes Father     Arthritis Father     Breast cancer Maternal Grandmother     Cancer Maternal Grandmother         Breast cancer    Breast cancer Paternal Grandmother     Cancer Paternal Grandmother         Breast cancer    Multiple sclerosis Mother     Hip fracture Mother     Arthritis Brother         Reiter's    Allergy (severe) Brother     Squamous cell carcinoma Neg Hx     Basal cell carcinoma Neg Hx     Melanoma Neg Hx        Social History  Social History     Tobacco Use    Smoking status: Former     Current packs/day: 0.25     Average packs/day: 0.3 packs/day for 10.0 years (2.5 ttl pk-yrs)     Types: Cigarettes, e-Cigarettes     Passive exposure: Past    Smokeless tobacco: Never   Vaping Use    Vaping status: Former   Substance Use Topics    Alcohol use: Not Currently     Alcohol/week: 2.0 standard drinks of alcohol    Drug use: No        Physical Exam     VITAL SIGNS:      Vitals:    11/04/23 1258 11/04/23 1403 11/04/23 1705   BP: 118/79 120/80 109/70   Pulse: 86 101 95   Resp: 16 27 16    Temp: 36.7 ??C (98 ??F)  36.6 ??C (97.8 ??F)   TempSrc: Oral  Oral   SpO2: 100%  98%       Constitutional: Alert and oriented. No acute distress.  Eyes: Conjunctivae are  normal.  HEENT: Normocephalic and atraumatic. Conjunctivae clear. No congestion. Moist mucous membranes.   Cardiovascular: Rate as above, regular rhythm. Normal and symmetric distal pulses. Brisk capillary refill. Normal skin turgor.  Respiratory: Normal respiratory effort. Breath sounds are normal. There are no wheezing or crackles heard.  Gastrointestinal: Soft, non-distended, non-tender.  Genitourinary: Deferred.  Musculoskeletal: Non-tender with normal range of motion in all extremities.  Neurologic: Normal speech and language. No gross focal neurologic deficits are appreciated. Patient is moving all extremities equally, face is symmetric at rest and with speech.  Skin: Skin is warm, dry and intact. No rash noted.  Psychiatric: Mood and affect are normal. Speech and behavior are normal.     Radiology     No orders to display       Pertinent labs & imaging results that were available during my care of the patient were independently interpreted by me and considered in my medical decision making (see chart for details).    Portions of this record have been created using Scientist, clinical (histocompatibility and immunogenetics). Dictation errors have been sought, but may not have been identified and corrected.         Loman Brooklyn, FNP  11/05/23 1053

## 2023-11-04 NOTE — Unmapped (Incomplete)
States she is taking erythromycin from this am for sore throat and she woke up with her throat feels like closing

## 2023-11-13 NOTE — Unmapped (Signed)
[facility] Refill Coordination Note    Specialty Medication(s) to be Shipped:   Inflammatory Disorders: Cosentyx    Other medication(s) to be shipped: No additional medications requested for fill at this time     Teresa Bowman, DOB: 11/24/1980  Phone: There are no phone numbers on file.      All above HIPAA information was verified with patient.     Was a Nurse, learning disability used for this call? No    Completed refill call assessment today to schedule patient's medication shipment from the [facility]  [phone]).  All relevant notes have been reviewed.     Specialty medication(s) and dose(s) confirmed: Regimen is correct and unchanged.   Changes to medications: Pretty reports no changes at this time.  Changes to insurance: No  New side effects reported not previously addressed with a pharmacist or physician: None reported  Questions for the pharmacist: No    Confirmed patient received a Conservation officer, historic buildings and a Surveyor, mining with first shipment. The patient will receive a drug information handout for each medication shipped and additional FDA Medication Guides as required.       DISEASE/MEDICATION-SPECIFIC INFORMATION        For patients on injectable medications: Patient currently has 0 doses left.  Next injection is scheduled for 12/31.    SPECIALTY MEDICATION ADHERENCE     Medication Adherence    Patient reported X missed doses in the last month: 0  Specialty Medication: Cosentyx 300 mg/83mL  Patient is on additional specialty medications: No  Patient is on more than two specialty medications: No  Any gaps in refill history greater than 2 weeks in the last 3 months: no  Demonstrates understanding of importance of adherence: yes  Informant: patient          Were doses missed due to medication being on hold? No    Cosentyx 300  mg/20mL : 0 doses of medicine on hand     REFERRAL TO PHARMACIST     Referral to the pharmacist: Not needed      [facility]     Shipping address confirmed in Epic.       Delivery Scheduled: Yes, Expected medication delivery date: 11/17/23.     Medication will be delivered via UPS to the prescription address in Epic WAM.    Oliva Bustard, PharmD   [facility]  Specialty Pharmacist

## 2023-11-16 MED FILL — COSENTYX UNOREADY PEN 300 MG/2 ML (150 MG/ML) SUBCUTANEOUS: SUBCUTANEOUS | 28 days supply | Qty: 4 | Fill #2

## 2023-11-27 NOTE — Unmapped (Incomplete)
 You are welcome to join the [facility] for HS of the [employer] group online at [email or web address] or in-person at our regular meetings.  You can textHS to [phone] for meeting reminders or join the group online for regular updates.  This can be a great opportunity to interact and learn from other patients and help work with the HS community.  We hope to see your there!    Hidradentis Suppurativa (pronounced ???high-drad-en-eye-tis/sup-your-uh-tee-vah???) is a chronic disease of hair follicles.  The lesions occur most commonly on areas of skin-to-skin contact: under the arms (axillary area), in the groin, around the buttocks, in the region around the anus and genitals, and on the skin between and under the breasts. In women, the underarms, groin, and breast areas are most commonly affected. Men most often have HS lesions on the buttocks and under the arms and may also have HS at the back of the neck and behind and around the ears.    What does HS look and feel like?   The first thing that someone with HS notices is a tender, raised, red bump that looks like an under-the-skin pimple or boil. Sometimes HS lesions have two or more ???heads.???  In mild disease only an occasional boil or abscess may occur, but in more active disease there can be many new lesions every month.  Some abscesses can become larger and may open and drain pus.  Bleeding and increased odor can also occur. In severe disease, deeper abscesses develop and may connect with each other under the skin to form tunnel-like tracts (sinuses, fistulas).  These may drain constantly, or may temporarily improve and then usually begin draining again over time.  In people who have had sinus tracts for some time, scars form that feel like ropes under the skin. In the very worst cases, networks of sinus tracts can form deeper in the body, including the muscle and other tissues. Many people with severe HS have scars that can limit their ability to freely move their arms or legs, though this is very unlikely for most patients.     Clinicians usually classify or ???grade??? HS using the [facility] staging system according to the severity of the disease for each body location:   [place] stage I: one or more abscesses are present, but no sinus tracts have formed and no scars have developed   Doreene Adas stage II: one or more abscesses are present that resolve and recur; on sinus tract can be present and scarring is seen   Doreene Adas stage III: many abscesses and more than one sinus tract is present with extensive scars.    What causes HS?  The cause of HS is not completely understood.  It seems to be a disorder of hair follicles and often many family members are affected so genetics probably play a strong role.  Bacteria are often present and may make the disease worse, but infection does not seem to be the main cause. Hormones are also likely play a role since the condition typically starts around puberty when hair follicles under the arms and in the groin start to change.  It can sometimes flare with menstrual cycles in women as well.  In most cases it lasts for decades and starts to improve to some extent in the late 30s and 40s as long as many fistulas have not already formed.  Women are three times more likely than men to develop HS.    Other factors are known to contribute to HS  flaring or becoming worse, though they are likely not the main causes. The factors most commonly associated with HS include:   Cigarette smoking - Stopping smoking will likely not cure the disease, but likely is helpful in reducing how much and how often it flares and may prevent it from getting as bad over time.   Higher weight - HS may occur even in people that are not overweight, but it is much more common in patients that are.  There is some evidence that losing weight and eating a diet low in sugars and fats may be helpful in improving hidradenitis, though this is not helpful for everyone.  Working with a nutritionist may be an important way to help with this and is something your physician can help coordinate    Hidradenitis is not contagious.  It is not caused by a problem with personal hygiene or any other activity or behavior of those with the disease.    How can your doctor help you treat your hidradenitis?  Clinicians use both medication and surgery to treat HS. The choice of treatment--or combination of treatments--is made according to an individual patient???s needs. Clinicians consider several factors in determining the most appropriate plan for therapy:   Severity of disease - medications and some laser treatments are usually able to control disease best when fistulas are not present.  Fistulas typically require surgery.   Extent and location of disease   Chronicity (how often the lesions recur)    A number of different surgical methods have been developed that are useful for certain patients under particular circumstances. These can be done with local numbing and healing at home for some areas when disease is not too extensive with relatively brief recovery times.  In more extensive disease there may be a need for larger excisions under general anesthesia with healing time in the hospital and prolonged recovery periods for better disease control.      In addition, many medical treatments have been tried--some with more success than others. No medication is effective for all patients, and you and your doctor may have to try several different treatments or combinations of treatments before you find the treatment plan that works best for you.  The goals of therapy with medications that are either topical (used on the skin) or systemic (taken by mouth) are:  1. to clear the lesions or at least reduce their number and extent, and  2. to prevent new lesions from forming.  3. To reduce pain, drainage, and odor  Some of the types of medications commonly used are antibacterial skin washes and the topical antibiotics to prevent secondary infections and corticosteroid injections into the lesions to reduce inflammation.     Other medications that may be used include retinoids (similar to Accutane), drugs that effect how hormones and hair follicles interact, drugs that affect your immune system (such as methotrexate, adalimumab/Humira, and Remicaid/infliximab), steroids, and oral antibiotics.    Lasers that destroy hair follicles can also be helpful since they reduce the hair follicles that cause the problems.  Multiple treatments are typically required over time and there is some discomfort associated with treatment, but it is typically very fast and well-tolerated.    It is very important to realize that hidradenitis cannot usually be completely cured with any single medication or surgical procedure.  It is a disease that can be very stubborn and difficult to control, but with good treatment a lot of improvement and sometimes temporary remissions can be  obtained. Poorly controlled disease can cause more fistulas to form and make managing the disease much more difficult over time so it is important to seek care to reduce major flares.  Surgery can provide a long term cure in some areas, though the disease can start again or continue in nearby areas.  A dermatologist is often the best person to help coordinate disease treatment, and sometimes other surgeons, pain specialists, other specialists, and nutritionists may be part of the treatment team.    For severe disease, the first goal is often to reduce pain and symptoms with medicines so that the disease feels more stable. Once it's stable, we often start thinking about how to address areas that have completely gotten better with surgery if they are still causing problems.    What can you do to help your HS?  1. Stopping smoking is hard and may not fix everything, but it may be a step in the right direction.  We or your primary care physician can provide resources to help stop if you are interested.  2. Follow a healthy diet and try to achieve a healthy weight.  Some other self-help measures are:   Keep your skin cool and dry (becoming overheated and sweating can contribute to an HS flare)   To reduce the pain of cysts or nodules or to help them to drain, apply hot compresses or soak in hot water for 10 minutes at a time (use a clean washcloth or a teabag soaked in hot water)   For female patients, cotton underwear that does not have tight elastic in the groin can be helpful.  Boyshort, brief, or boxer style underwear may be a better option as friction on hair follicles in affected areas can be a major trigger in some patients.  These can be easily found on [place] or with some retailers.  Fruit of the Loom and Underworks are two brands that are sometimes recommended.    Finally, know that you are not alone. Coping with the pain and other symptoms of HS can be very difficult, so it may be helpful to connect with others who live with HS. Patient groups and networks can be sources of important information and support. Some internet resources for information and connections are provided below.    Psychologytoday.com is a resource to find psychologists and therapists that can help support you in your are     [email or web address] can help connect with sexual health resources and counselors    Resources for Information    The Hidradenitis Suppurativa Foundation: A nonprofit organized by a group of physicians interested in treating and advancing research in hidradenitis suppurativa.  This group advocates for better care and research for hidradenitis and has educational materials put together specifically for patients that have been reviewed and produced by doctors and people with hidradenitis.    American Academy of Dermatology  ARanked.fi    [employer]  ElevatorPitchers.de.html  NORD: IT trainer for [employer]  https://www.rarediseases.org/rare-disease-information/rare-diseases/byID/358/viewAbstract  Trials of new medications for HS  Https://www.clinicaltrials.gov

## 2023-11-27 NOTE — Unmapped (Deleted)
This encounter was created in error - please disregard.

## 2023-12-01 NOTE — Unmapped (Incomplete)
For your HS:    Inject the contents of 1 cosentyx pen (300 mg) under the skin every 2 weeks.    Take 1 tablet (100 mg) of spironolactone every day    Take 1 capsule (100 mg) of doxycycline twice a day with food and a full glass of water. Stay upright for 30 minutes after taking. Sun protect while taking this medication.      Apply triamcinolone 0.1% cream twice a day to active areas of HS until skin is smooth, then stop. Repeat as needed. Do not use for more than 2 weeks at a time.      Apply clindamycin 1% lotion to HS lesions twice a day until no longer inflamed.    You are welcome to join the [facility] for HS of the [employer] group online at [email or web address] or in-person at our regular meetings.  You can textHS to [phone] for meeting reminders or join the group online for regular updates.  This can be a great opportunity to interact and learn from other patients and help work with the HS community.  We hope to see your there!    Hidradentis Suppurativa (pronounced ???high-drad-en-eye-tis/sup-your-uh-tee-vah???) is a chronic disease of hair follicles.  The lesions occur most commonly on areas of skin-to-skin contact: under the arms (axillary area), in the groin, around the buttocks, in the region around the anus and genitals, and on the skin between and under the breasts. In women, the underarms, groin, and breast areas are most commonly affected. Men most often have HS lesions on the buttocks and under the arms and may also have HS at the back of the neck and behind and around the ears.    What does HS look and feel like?   The first thing that someone with HS notices is a tender, raised, red bump that looks like an under-the-skin pimple or boil. Sometimes HS lesions have two or more ???heads.???  In mild disease only an occasional boil or abscess may occur, but in more active disease there can be many new lesions every month.  Some abscesses can become larger and may open and drain pus.  Bleeding and increased odor can also occur. In severe disease, deeper abscesses develop and may connect with each other under the skin to form tunnel-like tracts (sinuses, fistulas).  These may drain constantly, or may temporarily improve and then usually begin draining again over time.  In people who have had sinus tracts for some time, scars form that feel like ropes under the skin. In the very worst cases, networks of sinus tracts can form deeper in the body, including the muscle and other tissues. Many people with severe HS have scars that can limit their ability to freely move their arms or legs, though this is very unlikely for most patients.     Clinicians usually classify or ???grade??? HS using the [facility] staging system according to the severity of the disease for each body location:   [place] stage I: one or more abscesses are present, but no sinus tracts have formed and no scars have developed   Doreene Adas stage II: one or more abscesses are present that resolve and recur; on sinus tract can be present and scarring is seen   Doreene Adas stage III: many abscesses and more than one sinus tract is present with extensive scars.    What causes HS?  The cause of HS is not completely understood.  It seems to be a disorder of hair follicles and  often many family members are affected so genetics probably play a strong role.  Bacteria are often present and may make the disease worse, but infection does not seem to be the main cause. Hormones are also likely play a role since the condition typically starts around puberty when hair follicles under the arms and in the groin start to change.  It can sometimes flare with menstrual cycles in women as well.  In most cases it lasts for decades and starts to improve to some extent in the late 30s and 40s as long as many fistulas have not already formed.  Women are three times more likely than men to develop HS.    Other factors are known to contribute to HS flaring or becoming worse, though they are likely not the main causes. The factors most commonly associated with HS include:   Cigarette smoking - Stopping smoking will likely not cure the disease, but likely is helpful in reducing how much and how often it flares and may prevent it from getting as bad over time.   Higher weight - HS may occur even in people that are not overweight, but it is much more common in patients that are.  There is some evidence that losing weight and eating a diet low in sugars and fats may be helpful in improving hidradenitis, though this is not helpful for everyone.  Working with a nutritionist may be an important way to help with this and is something your physician can help coordinate    Hidradenitis is not contagious.  It is not caused by a problem with personal hygiene or any other activity or behavior of those with the disease.    How can your doctor help you treat your hidradenitis?  Clinicians use both medication and surgery to treat HS. The choice of treatment--or combination of treatments--is made according to an individual patient???s needs. Clinicians consider several factors in determining the most appropriate plan for therapy:   Severity of disease - medications and some laser treatments are usually able to control disease best when fistulas are not present.  Fistulas typically require surgery.   Extent and location of disease   Chronicity (how often the lesions recur)    A number of different surgical methods have been developed that are useful for certain patients under particular circumstances. These can be done with local numbing and healing at home for some areas when disease is not too extensive with relatively brief recovery times.  In more extensive disease there may be a need for larger excisions under general anesthesia with healing time in the hospital and prolonged recovery periods for better disease control.      In addition, many medical treatments have been tried--some with more success than others. No medication is effective for all patients, and you and your doctor may have to try several different treatments or combinations of treatments before you find the treatment plan that works best for you.  The goals of therapy with medications that are either topical (used on the skin) or systemic (taken by mouth) are:  1. to clear the lesions or at least reduce their number and extent, and  2. to prevent new lesions from forming.  3. To reduce pain, drainage, and odor  Some of the types of medications commonly used are antibacterial skin washes and the topical antibiotics to prevent secondary infections and corticosteroid injections into the lesions to reduce inflammation.     Other medications that may be used include  retinoids (similar to Accutane), drugs that effect how hormones and hair follicles interact, drugs that affect your immune system (such as methotrexate, adalimumab/Humira, and Remicaid/infliximab), steroids, and oral antibiotics.    Lasers that destroy hair follicles can also be helpful since they reduce the hair follicles that cause the problems.  Multiple treatments are typically required over time and there is some discomfort associated with treatment, but it is typically very fast and well-tolerated.    It is very important to realize that hidradenitis cannot usually be completely cured with any single medication or surgical procedure.  It is a disease that can be very stubborn and difficult to control, but with good treatment a lot of improvement and sometimes temporary remissions can be obtained. Poorly controlled disease can cause more fistulas to form and make managing the disease much more difficult over time so it is important to seek care to reduce major flares.  Surgery can provide a long term cure in some areas, though the disease can start again or continue in nearby areas.  A dermatologist is often the best person to help coordinate disease treatment, and sometimes other surgeons, pain specialists, other specialists, and nutritionists may be part of the treatment team.    For severe disease, the first goal is often to reduce pain and symptoms with medicines so that the disease feels more stable. Once it's stable, we often start thinking about how to address areas that have completely gotten better with surgery if they are still causing problems.    What can you do to help your HS?  1. Stopping smoking is hard and may not fix everything, but it may be a step in the right direction.  We or your primary care physician can provide resources to help stop if you are interested.  2. Follow a healthy diet and try to achieve a healthy weight.  Some other self-help measures are:   Keep your skin cool and dry (becoming overheated and sweating can contribute to an HS flare)   To reduce the pain of cysts or nodules or to help them to drain, apply hot compresses or soak in hot water for 10 minutes at a time (use a clean washcloth or a teabag soaked in hot water)   For female patients, cotton underwear that does not have tight elastic in the groin can be helpful.  Boyshort, brief, or boxer style underwear may be a better option as friction on hair follicles in affected areas can be a major trigger in some patients.  These can be easily found on [place] or with some retailers.  Fruit of the Loom and Underworks are two brands that are sometimes recommended.    Finally, know that you are not alone. Coping with the pain and other symptoms of HS can be very difficult, so it may be helpful to connect with others who live with HS. Patient groups and networks can be sources of important information and support. Some internet resources for information and connections are provided below.    Psychologytoday.com is a resource to find psychologists and therapists that can help support you in your are     [email or web address] can help connect with sexual health resources and counselors    Resources for Information    The Hidradenitis Suppurativa Foundation: A nonprofit organized by a group of physicians interested in treating and advancing research in hidradenitis suppurativa.  This group advocates for better care and research for hidradenitis and has educational materials put together specifically for  patients that have been reviewed and produced by doctors and people with hidradenitis.    American Academy of Dermatology  ARanked.fi    [employer]  E[employer].de.html  NORD: IT trainer for [employer]  https://www.rarediseases.org/rare-disease-information/rare-diseases/byID/358/viewAbstract  Trials of new medications for HS  Https://www.clinicaltrials.gov

## 2023-12-01 NOTE — Unmapped (Unsigned)
Dermatology Note    Assessment and Plan:      Hidradenitis Suppurativa, Hurley stage II, chronic, flaring in R perianal area ***   - Failed adalimumab 80mg  weekly.   - We discussed the typical natural history, pathogenesis, treatment options, and expected course as well as the relapsing and sometimes recalcitrant nature of the disease.    - Based on discussion above and R/B/A reviewed, mutual decision to proceed with the following:  - Continue *** Cosentyx 300mg  every 2 weeks  - Continue *** spironolactone 100 mg daily. SER including menstrual irregularity, breast tenderness, low risk of orthostatic hypotension or electrolyte changes   - Continue *** doxycycline 100 mg BID for flares. Reviewed side effects including GI upset and photosensitivity.  Advised patient to take it with food.   - Continue *** triamcinolone 0.1% cream BID to affected area for up to 1 week at a time as needed for flares. Appropriate use and side effects of topical steroids discussed.  - Continue *** clindamycin 1 % lotion; Apply twice daily to HS as needed until it clears.   - Jointly elected to repeat ILK-40 per procedure note below: ***      High risk medication use (Cosentyx)  - Quant Gold negative on 03/01/23     The patient was advised to call for an appointment should any new, changing, or symptomatic lesions develop.     RTC: No follow-ups on file. or sooner as needed   _________________________________________________________________      Chief Complaint     No chief complaint on file.      HPI     Teresa Bowman is a 43 y.o. female who presents as a returning patient (last seen by Dr. Loma Sousa and Dr. Janyth Contes on 09/12/2023) to Dermatology for follow up of hidradenitis suppurativa. At last visit, patient received ILK 40 mg/mL injections and was to increase cosentyx 300 mg from every 28 days to every 14 days, continue spironolactone 100 mg daily, continue triamcinolone 0.1% cream, continue clindamycin 1% lotion, and continue doxycycline 100 mg BID for his HS.    ***    Self-reported severity (0-5): {0-5:36085}  VAS pain today: {numbers 0-10:5044}  VAS average pain for the last month: {numbers 0-10:5044}  Requiring pain medication? {YES/NO:21013}  If so, what type/frequency? ***  How often in pain?  {hspain:53295}  Level of odor (0-5): {0-5 selection:53296}  Level of itching (0-5): {0-5 selection:53296}  Dressing changes needed for drainage:{hs dressings:52675}  How much drainage: {hsdrainage:52676}  Flare in the last month (Y/N)? {YES/NO:21013}  How long ago was the last flare? {last flare:53297}  Developing new lesions? {hsnewlesion:52677}  Number of inflammatory lesions montly: {number of lesions monthly:53298}  DLQI: ***  Current treatment: ***     How helpful is the current treatment in managing the following aspects of your disease?  Not at all helpful Somewhat helpful Very helpful   Pain      Decreasing length of flares      Decreasing new lesions      Drainage      Decreasing frequency of flares      Decreasing severity of flares      Odor        The patient denies any other new or changing lesions or areas of concern.     Pertinent Past Medical History     No history of skin cancer    HS    Family History:  Negative for melanoma and NMSC  Past Medical History, Family History, Social History, Medication List, Allergies, and Problem List were reviewed in the rooming section of Epic.     ROS: Other than symptoms mentioned in the HPI, no fevers, chills, or other skin complaints    Physical Examination     Gen: Well-appearing patient, appropriate, interactive, in no acute distress  SKIN (Focal Skin Exam): Per patient request, examination of bilateral axillae, chest, abdomen, bilateral thighs, groin, buttocks, and external genitalia was performed    location Abscess Inflamed nodule Non-inflamed nodule Draining sinus Non-draining Sinus Hurley % scar   R axilla          L axilla          R inframammary          L inframammary Intermammary          Pubic          R inguinal          R thigh          L inguinal          L thigh          Scrotum/Vulva          Perianal          R buttock          L buttock          Other (list)                      AN count (total sum of abscess and inflammatory nodule): ***  Pilonidal sinus? No    -sites not commented on demonstrate normal findings.    Scribe's Attestation: Elsie Stain, MD obtained and performed the history, physical exam and medical decision making elements that were entered into the chart.  Signed by Donnella Sham, Scribe, on ***.    {*** NOTE TO PROVIDER: PLEASE ADD ATTESTATION NOTING YOU AGREE WITH SCRIBE DOCUMENTATION}     (Approved Template 08/03/2020)

## 2023-12-04 ENCOUNTER — Ambulatory Visit: Admit: 2023-12-04 | Discharge: 2023-12-05 | Payer: PRIVATE HEALTH INSURANCE

## 2023-12-04 DIAGNOSIS — L304 Erythema intertrigo: Principal | ICD-10-CM

## 2023-12-04 DIAGNOSIS — B3731 Vagina, candidiasis: Principal | ICD-10-CM

## 2023-12-04 DIAGNOSIS — L732 Hidradenitis suppurativa: Principal | ICD-10-CM

## 2023-12-04 MED ADMIN — triamcinolone acetonide (KENALOG-40) injection 40 mg: 40 mg | INTRALESIONAL | @ 14:00:00 | Stop: 2023-12-04

## 2023-12-04 NOTE — Unmapped (Signed)
Self-reported severity (0-5): 1  VAS pain today: 2  VAS average pain for the last month: 1  Requiring pain medication? No.  If so, what type/frequency? -  How often in pain?  few times a week  Level of odor (0-5): 1  Level of itching (0-5): 1  Dressing changes needed for drainage:No drainage/less than once weekly  How much drainage: no drainage  Flare in the last month (Y/N)? No.  How long ago was the last flare? in the last year  Developing new lesions? less than monthly  Number of inflammatory lesions montly: 1-3  DLQI: 6  Current treatment: costenyx    How helpful is the current treatment in managing the following aspects of your disease?  Not at all helpful Somewhat helpful Very helpful   Pain   x   Decreasing length of flares   x   Decreasing new lesions   x   Drainage   x   Decreasing frequency of flares   x   Decreasing severity of flares   x   Odor   x

## 2023-12-15 NOTE — Unmapped (Signed)
[facility] Refill Coordination Note    Specialty Medication(s) to be Shipped:   Inflammatory Disorders: Cosentyx    Other medication(s) to be shipped: No additional medications requested for fill at this time     Teresa Bowman, DOB: 1981-05-11  Phone: There are no phone numbers on file.      All above HIPAA information was verified with patient.     Was a Nurse, learning disability used for this call? No    Completed refill call assessment today to schedule patient's medication shipment from the [facility]  [phone]).  All relevant notes have been reviewed.     Specialty medication(s) and dose(s) confirmed: Regimen is correct and unchanged.   Changes to medications: Teresa Bowman reports no changes at this time.  Changes to insurance: No  New side effects reported not previously addressed with a pharmacist or physician: None reported  Questions for the pharmacist: No    Confirmed patient received a Conservation officer, historic buildings and a Surveyor, mining with first shipment. The patient will receive a drug information handout for each medication shipped and additional FDA Medication Guides as required.       DISEASE/MEDICATION-SPECIFIC INFORMATION        For patients on injectable medications: Patient currently has 1 doses left.  Next injection is scheduled for 01/27.    SPECIALTY MEDICATION ADHERENCE     Medication Adherence    Patient reported X missed doses in the last month: 0  Specialty Medication: secukinumab: COSENTYX UNOREADY PEN 300 mg/2 mL (150 mg/mL) Pnij  Patient is on additional specialty medications: No  Patient is on more than two specialty medications: No  Any gaps in refill history greater than 2 weeks in the last 3 months: no  Demonstrates understanding of importance of adherence: yes  Informant: patient  Reliability of informant: reliable  Provider-estimated medication adherence level: good  Patient is at risk for Non-Adherence: No  Reasons for non-adherence: no problems identified  Confirmed plan for next specialty medication refill: delivery by pharmacy  Refills needed for supportive medications: not needed          Refill Coordination    Has the Patients' Contact Information Changed: No  Is the Shipping Address Different: No         Were doses missed due to medication being on hold? No    Cosentyx 300/2 mg/ml: 0 days of medicine on hand       REFERRAL TO PHARMACIST     Referral to the pharmacist: Not needed      [facility]     Shipping address confirmed in Epic.       Delivery Scheduled: Yes, Expected medication delivery date: 01/28.     Medication will be delivered via UPS to the prescription address in Epic WAM.    [facility]  Specialty Technician

## 2023-12-18 MED FILL — COSENTYX UNOREADY PEN 300 MG/2 ML (150 MG/ML) SUBCUTANEOUS: SUBCUTANEOUS | 28 days supply | Qty: 4 | Fill #3

## 2024-01-09 NOTE — Unmapped (Signed)
 [facility] Refill Coordination Note    Specialty Medication(s) to be Shipped:   Inflammatory Disorders: Cosentyx    Other medication(s) to be shipped: No additional medications requested for fill at this time     Teresa Bowman, DOB: Feb 02, 1981  Phone: There are no phone numbers on file.      All above HIPAA information was verified with patient.     Was a Nurse, learning disability used for this call? No    Completed refill call assessment today to schedule patient's medication shipment from the [facility]  [phone]).  All relevant notes have been reviewed.     Specialty medication(s) and dose(s) confirmed: Regimen is correct and unchanged.   Changes to medications: Shamira reports no changes at this time.  Changes to insurance: No  New side effects reported not previously addressed with a pharmacist or physician: None reported  Questions for the pharmacist: No    Confirmed patient received a Conservation officer, historic buildings and a Surveyor, mining with first shipment. The patient will receive a drug information handout for each medication shipped and additional FDA Medication Guides as required.       DISEASE/MEDICATION-SPECIFIC INFORMATION        For patients on injectable medications: Patient currently has 1 doses left.  Next injection is scheduled for 01/16/24 .    SPECIALTY MEDICATION ADHERENCE     Medication Adherence    Patient reported X missed doses in the last month: 0  Specialty Medication: secukinumab: COSENTYX UNOREADY PEN 300 mg/2 mL (150 mg/mL) Pnij  Patient is on additional specialty medications: No  Patient is on more than two specialty medications: No  Any gaps in refill history greater than 2 weeks in the last 3 months: no  Demonstrates understanding of importance of adherence: yes                Were doses missed due to medication being on hold? No    Cosentyx 300/2 mg/ml: 0 days of medicine on hand       REFERRAL TO PHARMACIST     Referral to the pharmacist: Not needed      [facility]     Shipping address confirmed in Epic.       Delivery Scheduled: Yes, Expected medication delivery date: 01/16/24 .     Medication will be delivered via UPS to the prescription address in Epic WAM.    Ricci Barker   [facility]  Specialty Technician

## 2024-01-15 MED FILL — COSENTYX UNOREADY PEN 300 MG/2 ML (150 MG/ML) SUBCUTANEOUS: SUBCUTANEOUS | 28 days supply | Qty: 4 | Fill #4

## 2024-01-15 NOTE — Unmapped (Signed)
 Dermatology Note     Assessment and Plan:      Hidradenitis Suppurativa, Hurley stage II, chronic, stable on cosentyx q2wks, mildly flaring in L inguinal today    - Failed adalimumab 80mg  weekly.   - We discussed the typical natural history, pathogenesis, treatment options, and expected course as well as the relapsing and sometimes recalcitrant nature of the disease.    - Patient overall stable but may consider future unroofings (of left inguinal) with Dr. Janyth Contes. She will discuss with him at follow-up.   - Continue Cosentyx 300mg  every 2 weeks. Discussed risks of tuberculosis, other uncommon infections, theoretical risk of malignancies, and other uncommon side effects including oral thrush.  - Continue doxycycline 100 mg BID x 2 weeks PRN flares. Reviewed side effects including GI upset and photosensitivity.  Advised patient to take it with food.   - Continue triamcinolone 0.1% cream BID to affected area for up to 1 week at a time as needed for flares. Appropriate use and side effects of topical steroids discussed.  - Continue clindamycin 1 % lotion; Apply BID to HS as needed until it clears.   - Jointly elected to repeat ILK-40 per procedure note below:       Intralesional Kenalog Procedure Note: After the patient was informed of risks (including atrophy and dyspigmentation), benefits and side effects of intralesional steroid injection, the patient elected to undergo injection and verbal consent was obtained. Skin was cleaned with alcohol and injected intralesionally into the sites (below). The patient tolerated the procedure well without complications and was instructed on post-procedure care.  Location(s): L labia, right superior labia  Number of sites treated: 2   Kenalog (triamcinolone) Concentration: 40 mg/ml   Volume: 0.2 ml total       High risk medication use (Cosentyx)  - Quant Gold negative on 03/01/23      Hx of recurrent vaginal yeast infections  - Diagnosis, treatment options, prognosis, risk/ benefit, and side effects of treatment were discussed with the patient.   - Reviewed that this is likely an adverse side effect of long term oral abx usage rather than cosentyx  - Offered Rx for fluconazole and patient declined given that she already has refills from her OB/GYN  - Advised to continue OTC monistat PRN    The patient was advised to call for an appointment should any new, changing, or symptomatic lesions develop.     RTC: Return for Next scheduled follow up. or sooner as needed   _________________________________________________________________      Chief Complaint     Chief Complaint   Patient presents with    HS     Flare on groin for 1 week, sore on sacrum       HPI     Teresa Bowman is a 43 y.o. female who presents as a returning patient (last seen by Dr. Janyth Contes on 12/04/2023) to Dermatology for follow up of HS. At last visit, patient was to continue Cosentyx 300 mg every 2 weeks, doxycyline 100 mg twice daily for 2 weeks as needed, triamcinolone 0.1% cream, clindamycin 1% lotions, and received ILK-40 injections for HS.         History of Present Illness  Teresa Bowman is a 43 year old female with hidradenitis suppurativa who presents with a flare-up of lesions in the groin area.    She has been experiencing flares in the groin area for about four days, with two bothersome areas, one side more affected than  the other. The flares are painful, especially when sitting, and have not responded to the use of steroid cream at home.    She has a history of receiving steroid injections for these flares, which usually provide relief. However, one area has not resolved despite previous injections, flaring up at least three times without forming a head, remaining painful and bothersome throughout the day.    She is currently on Cosentyx injections, which have been effective in keeping her condition calmer overall. She is not taking any oral antibiotics at present due to a history of recurrent yeast infections following antibiotic use. She occasionally uses a clindamycin solution for minor issues, which she finds helpful.    The skin in one area is thin due to previous injections, and she has experienced a new flare-up in a different area that has not been injected before.    (Approved Template 12/23/2023)    The patient denies any other new or changing lesions or areas of concern.     Pertinent Past Medical History     No history of skin cancer    HS    Family History:   Negative for melanoma and NMSC    Past Medical History, Family History, Social History, Medication List, Allergies, and Problem List were reviewed in the rooming section of Epic.     ROS: Other than symptoms mentioned in the HPI, no fevers, chills, or other skin complaints    Physical Examination     GENERAL: Well-appearing female in no acute distress, resting comfortably.  NEURO: Alert and oriented, answers questions appropriately  PSYCH: Normal mood and affect  SKIN: Examination of the genitalia and groin was performed  Inflamed nodule x1 of left inguinal fold, inflamed nodule x1 of right labia      All areas not commented on are within normal limits or unremarkable      (Approved Template 08/03/2020)

## 2024-01-17 ENCOUNTER — Ambulatory Visit
Admit: 2024-01-17 | Discharge: 2024-01-18 | Payer: PRIVATE HEALTH INSURANCE | Attending: Student in an Organized Health Care Education/Training Program | Primary: Student in an Organized Health Care Education/Training Program

## 2024-01-17 DIAGNOSIS — Z79899 Other long term (current) drug therapy: Principal | ICD-10-CM

## 2024-01-17 DIAGNOSIS — L732 Hidradenitis suppurativa: Principal | ICD-10-CM

## 2024-01-17 MED ADMIN — triamcinolone acetonide (KENALOG-40) injection 40 mg: 40 mg | @ 15:00:00 | Stop: 2024-01-17

## 2024-01-28 NOTE — Unmapped (Signed)
 Fax received requesting PA for Cosentyx, PA was initiated via [facility]

## 2024-02-06 DIAGNOSIS — Z79899 Other long term (current) drug therapy: Principal | ICD-10-CM

## 2024-02-06 DIAGNOSIS — L732 Hidradenitis suppurativa: Principal | ICD-10-CM

## 2024-02-06 NOTE — Unmapped (Signed)
 Teresa Bowman reports she continues to do well on Cosentyx - she has some flaring occasionally but reports significant improvement from baseline.     [facility] Specialty and Home Delivery Pharmacy Clinical Assessment & Refill Coordination Note    Teresa Bowman, [place]: 12-05-1980  Phone: There are no phone numbers on file.    All above HIPAA information was verified with patient.     Was a Nurse, learning disability used for this call? No    Specialty Medication(s):   Inflammatory Disorders: Cosentyx     Current Outpatient Medications   Medication Sig Dispense Refill    baclofen (LIORESAL) 10 MG tablet Take 1 tablet (10 mg total) by mouth Three (3) times a day.      clobetasoL (TEMOVATE) 0.05 % ointment Twice daily as needed for spot treatment for up to one week on each spot 60 g 5    doxycycline (VIBRAMYCIN) 100 MG capsule Take 1 capsule (100 mg total) by mouth two (2) times a day for 2 weeks for flare 90 capsule 1    DULoxetine (CYMBALTA) 60 MG capsule TAKE ONE CAPSULE BY MOUTH ONE TIME DAILY 30 capsule 0    empty container Misc Use as directed to dispose of Humira Pens 1 each 3    EPINEPHrine (EPIPEN) 0.3 mg/0.3 mL injection Inject 0.3 mL (0.3 mg total) into the muscle once for 1 dose. 1 each 0    erythromycin (ROMYCIN) 5 mg/gram (0.5 %) ophthalmic ointment Administer to the right eye Three (3) times a day. 3.5 g 0    melatonin 3 mg Tab Take 1 tablet (3 mg total) by mouth every evening. 30 tablet 11    meloxicam (MOBIC) 15 MG tablet TAKE 1 TABLET BY MOUTH DAILY 90 tablet 3    multivitamin (THERAGRAN) per tablet Take 1 tablet by mouth daily.      secukinumab (COSENTYX UNOREADY PEN) 300 mg/2 mL (150 mg/mL) PnIj Inject the contents of 1 pen (300mg ) under the skin weekly for 5 weeks as loading doses (on weeks 0,1,2,3,4) 10 mL 0    secukinumab (COSENTYX UNOREADY PEN) 300 mg/2 mL PnIj Inject the contents of 1 pen (300 mg) under the skin every fourteen (14) days. 4 mL 10    spironolactone (ALDACTONE) 100 MG tablet Take 1 tablet (100 mg total) by mouth daily. 90 tablet 3    triamcinolone (KENALOG) 0.1 % cream Twice daily for up to 2 weeks for healing areas 80 g 2    triamcinolone (KENALOG) 0.1 % cream Apply topically two (2) times a day. 453.6 g 2     Current Facility-Administered Medications   Medication Dose Route Frequency Provider Last Rate Last Admin    triamcinolone acetonide (KENALOG-40) injection 40 mg  40 mg Intralesional Once Elsie Stain, MD            Changes to medications: Teresa Bowman reports no changes at this time.    Medication list has been reviewed and updated in Epic: Yes    Allergies   Allergen Reactions    Naltrexone Swelling    Augmentin [Amoxicillin-Pot Clavulanate] Rash    Ceclor [Cefaclor] Rash    Penicillins Rash       Changes to allergies: No    Allergies have been reviewed and updated in Epic: Yes    SPECIALTY MEDICATION ADHERENCE     Cosentyx - 0 left  Medication Adherence    Patient reported X missed doses in the last month: 0  Specialty Medication: Cosentyx  Specialty medication(s) dose(s) confirmed: Regimen is correct and unchanged.     Are there any concerns with adherence? No    Adherence counseling provided? Not needed    CLINICAL MANAGEMENT AND INTERVENTION      Clinical Benefit Assessment:    Do you feel the medicine is effective or helping your condition? Yes    Clinical Benefit counseling provided? Not needed    Adverse Effects Assessment:    Are you experiencing any side effects?  Possibly/multifactorial. Patient reports some yeast infections - may also be from antibiotics used for HS treatment. She prefers to not use abx because of this. She'll continue to monitor.     Are you experiencing difficulty administering your medicine? No    Quality of Life Assessment:    Quality of Life    Rheumatology  Oncology  Dermatology  1. What impact has your specialty medication had on the symptoms of your skin condition (i.e. itchiness, soreness, stinging)?: Tremendous  2. What impact has your specialty medication had on your comfort level with your skin?: Tremendous  Cystic Fibrosis          How many days over the past month did your HS  keep you from your normal activities? For example, brushing your teeth or getting up in the morning. Patient declined to answer    Have you discussed this with your provider? Not needed    Acute Infection Status:    Acute infections noted within Epic:  No active infections    Patient reported infection: None    Therapy Appropriateness:    Is therapy appropriate based on current medication list, adverse reactions, adherence, clinical benefit and progress toward achieving therapeutic goals? Yes, therapy is appropriate and should be continued     Clinical Intervention:    Was an intervention completed as part of this clinical assessment? No    DISEASE/MEDICATION-SPECIFIC INFORMATION      For patients on injectable medications: Patient currently has 0 doses left.  Next injection is scheduled for 3/25.    Chronic Inflammatory Diseases: Have you experienced any flares in the last month? Yes, but much improved from baseline  Has this been reported to your provider? Yes, had recent visit    PATIENT SPECIFIC NEEDS     Does the patient have any physical, cognitive, or cultural barriers? No    Is the patient high risk? No    Does the patient require physician intervention or other additional services (i.e., nutrition, smoking cessation, social work)? No    Does the patient have an additional or emergency contact listed in their chart? Yes    SOCIAL DETERMINANTS OF HEALTH     At the [facility], we have learned that life circumstances - like trouble affording food, housing, utilities, or transportation can affect the health of many of our patients.   That is why we wanted to ask: are you currently experiencing any life circumstances that are negatively impacting your health and/or quality of life? Patient declined to answer    Social Drivers of Health     Food Insecurity: Patient Declined (04/04/2023)    Received from [facility], [facility]    Hunger Vital Sign     Worried About Running Out of Food in the Last Year: Patient declined     Ran Out of Food in the Last Year: Patient declined   Tobacco Use: Medium Risk (01/17/2024)    Patient History     Smoking Tobacco  Use: Former     Smokeless Tobacco Use: Never     Passive Exposure: Past   Transportation Needs: Patient Declined (04/04/2023)    Received from [facility], [facility]    [facility] - Transportation     In the past 12 months, has lack of transportation kept you from medical appointments or from getting medications?: Patient declined     Lack of Transportation (Non-Medical): Patient declined   Alcohol Use: Not At Risk (08/12/2021)    Alcohol Use     How often do you have a drink containing alcohol?: Never     How many drinks containing alcohol do you have on a typical day when you are drinking?: 1 - 2     How often do you have 5 or more drinks on one occasion?: Never   Housing: Unknown (12/02/2023)    Received from [facility], [facility]    Housing Stability Vital Sign     Unable to Pay for Housing in the Last Year: Patient declined     Number of Times Moved in the Last Year: Not on file     Homeless in the Last Year: No   Physical Activity: Not on file   Utilities: Patient Declined (04/04/2023)    Received from [facility], [facility]    [facility] Utilities     Threatened with loss of utilities: Patient declined   Stress: Not on file   Interpersonal Safety: Not on file   Substance Use: Not on file (09/28/2023)   Intimate Partner Violence: Unknown (02/25/2022)    Received from [facility], [facility]    HITS     Physically Hurt: Not on file     Insult or Talk Down To: Not on file     Threaten Physical Harm: Not on file     Scream or Curse: Not on file   Social Connections: Unknown (03/21/2022) Received from [facility], [facility]    Social Network     Social Network: Not on file   Financial Resource Strain: Patient Declined (04/04/2023)    Received from [facility], [facility]    Overall Financial Resource Strain (CARDIA)     Difficulty of Paying Living Expenses: Patient declined   Depression: Not at risk (10/30/2023)    Received from [facility]    PHQ-2     Patient Health Questionnaire-2 Score: 0   Internet Connectivity: Not on file   Health Literacy: Low Risk  (05/10/2021)    Health Literacy     : Never       Would you be willing to receive help with any of the needs that you have identified today? Not applicable       SHIPPING     Specialty Medication(s) to be Shipped:   Inflammatory Disorders: Cosentyx    Other medication(s) to be shipped: No additional medications requested for fill at this time     Changes to insurance: No    Cost and Payment: Patient has a copay of $4. They are aware and have authorized the pharmacy to charge the credit card on file.    Delivery Scheduled: Yes, Expected medication delivery date: 3/21.     Medication will be delivered via UPS to the confirmed prescription address in [facility].    The patient will receive a drug information handout for each medication shipped and additional FDA Medication  Guides as required.  Verified that patient has previously received a Conservation officer, historic buildings and a Surveyor, mining.    The patient or caregiver noted above participated in the development of this care plan and knows that they can request review of or adjustments to the care plan at any time.      All of the patient's questions and concerns have been addressed.    Kynzi Levay [facility] Specialty Pharmacist

## 2024-02-08 MED FILL — COSENTYX UNOREADY PEN 300 MG/2 ML SUBCUTANEOUS: SUBCUTANEOUS | 28 days supply | Qty: 4 | Fill #5

## 2024-03-04 NOTE — Unmapped (Signed)
 [facility] Specialty and Home Delivery Pharmacy Refill Coordination Note    Teresa Bowman, [name]: 21-Jan-1981  Phone: There are no phone numbers on file.      All above HIPAA information was verified with patient.         03/01/2024     5:56 PM   Specialty Rx Medication Refill Questionnaire   Which Medications would you like refilled and shipped? Cosentyx    Please list all current allergies: None   Have you missed any doses in the last 30 days? No   Have you had any changes to your medication(s) since your last refill? No   How many days remaining of each medication do you have at home? 0   If receiving an injectable medication, next injection date is 03/12/2024   Have you experienced any side effects in the last 30 days? No   Please enter the full address (street address, city, state, zip code) where you would like your medication(s) to be delivered to. [address] [name] [address]   Please specify on which day you would like your medication(s) to arrive. Note: if you need your medication(s) within 3 days, please call the pharmacy to schedule your order at [phone]  03/08/2024   Has your insurance changed since your last refill? No   Would you like a pharmacist to call you to discuss your medication(s)? No   Do you require a signature for your package? (Note: if we are billing Medicare Part B or your order contains a controlled substance, we will require a signature) No   I have been provided my out of pocket cost for my medication and approve the pharmacy to charge the amount to my credit card on file. Yes         Completed refill call assessment today to schedule patient's medication shipment from the [facility] and Home Delivery Pharmacy [phone]).  All relevant notes have been reviewed.       Confirmed patient received a Conservation officer, historic buildings and a Surveyor, mining with first shipment. The patient will receive a drug information handout for each medication shipped and additional FDA Medication Guides as required.         REFERRAL TO PHARMACIST     Referral to the pharmacist: Not needed      [facility]     Shipping address confirmed in Epic.     Delivery Scheduled: Yes, Expected medication delivery date: 4/18.     Medication will be delivered via UPS to the prescription address in Epic [facility].    Teresa Bowman Specialty and [facility]

## 2024-03-07 MED FILL — COSENTYX UNOREADY PEN 300 MG/2 ML SUBCUTANEOUS PEN INJECTOR: SUBCUTANEOUS | 28 days supply | Qty: 4 | Fill #6

## 2024-03-25 NOTE — Unmapped (Signed)
 [facility] Specialty and Home Delivery Pharmacy Refill Coordination Note    Specialty Medication(s) to be Shipped:   Inflammatory Disorders: Cosentyx    Other medication(s) to be shipped: No additional medications requested for fill at this time     Teresa Bowman, DOB: Nov 03, 1981  Phone: There are no phone numbers on file.      All above HIPAA information was verified with patient.     Was a Nurse, learning disability used for this call? No    Completed refill call assessment today to schedule patient's medication shipment from the [facility] and Home Delivery Pharmacy  [phone]).  All relevant notes have been reviewed.     Specialty medication(s) and dose(s) confirmed: Regimen is correct and unchanged.   Changes to medications: [name] reports no changes at this time.  Changes to insurance: No  New side effects reported not previously addressed with a pharmacist or physician: None reported  Questions for the pharmacist: No    Confirmed patient received a Conservation officer, historic buildings and a Surveyor, mining with first shipment. The patient will receive a drug information handout for each medication shipped and additional FDA Medication Guides as required.       DISEASE/MEDICATION-SPECIFIC INFORMATION        For patients on injectable medications: Patient currently has 0 doses left.  Next injection is scheduled for 03/26/24.    SPECIALTY MEDICATION ADHERENCE     Medication Adherence    Patient reported X missed doses in the last month: 0  Specialty Medication: COSENTYX UNOREADY PEN 300 mg/2 mL Pnij (secukinumab)  Patient is on additional specialty medications: No              Were doses missed due to medication being on hold? No    COSENTYX UNOREADY PEN 300 mg/2 mL Pnij (secukinumab): 0 doses of medicine on hand       REFERRAL TO PHARMACIST     Referral to the pharmacist: Not needed      SHIPPING     Shipping address confirmed in Epic.     Cost and Payment: Unable to determine copay at this time as the prescription requires a prior authorization/financial assistance. Patient is aware that shipment will be held until copay has been approved and payment information collected, if needed.    Delivery Scheduled: Yes, Expected medication delivery date: 03/27/24.     Medication will be delivered via UPS to the prescription address in Epic W[facility].    Teresa Bowman   [facility] Specialty and Home Delivery Pharmacy  Specialty Technician

## 2024-03-26 MED FILL — COSENTYX UNOREADY PEN 300 MG/2 ML SUBCUTANEOUS PEN INJECTOR: SUBCUTANEOUS | 28 days supply | Qty: 4 | Fill #7

## 2024-04-26 NOTE — Unmapped (Signed)
 The [facility] has made a second and final attempt to reach this patient to refill the following medication:Cosentyx.      We have left voicemails on the following phone numbers: [phone], have sent a MyChart message, and have sent a text message to the following phone numbers: [phone].    Dates contacted: 04/22/2024  04/26/2024  Last scheduled delivery: 03/27/2024    The patient may be at risk of non-compliance with this medication. The patient should call the [facility] at [phone]  Option 4, then Option 2: Dermatology, Gastroenterology, Rheumatology to refill medication.    Teresa Bowman

## 2024-04-26 NOTE — Unmapped (Signed)
 error

## 2024-05-01 NOTE — Unmapped (Signed)
 [facility] Specialty and Home Delivery Pharmacy Refill Coordination Note    Specialty Medication(s) to be Shipped:   Inflammatory Disorders: Cosentyx    Other medication(s) to be shipped: No additional medications requested for fill at this time     Teresa Bowman, DOB: 1981/03/04  Phone: There are no phone numbers on file.      All above HIPAA information was verified with patient.     Was a Nurse, learning disability used for this call? No    Completed refill call assessment today to schedule patient's medication shipment from the [facility] and Home Delivery Pharmacy  [phone]).  All relevant notes have been reviewed.     Specialty medication(s) and dose(s) confirmed: Regimen is correct and unchanged.   Changes to medications: Tahni reports no changes at this time.  Changes to insurance: No  New side effects reported not previously addressed with a pharmacist or physician: None reported  Questions for the pharmacist: No    Confirmed patient received a Conservation officer, historic buildings and a Surveyor, mining with first shipment. The patient will receive a drug information handout for each medication shipped and additional FDA Medication Guides as required.       DISEASE/MEDICATION-SPECIFIC INFORMATION        For patients on injectable medications: Patient currently has 0 doses left.  Next injection is scheduled for 05/07/24.    SPECIALTY MEDICATION ADHERENCE     Medication Adherence    Patient reported X missed doses in the last month: 0  Specialty Medication: COSENTYX 300 mg/2 mL  Patient is on additional specialty medications: No  Informant: patient     Were doses missed due to medication being on hold? No    Cosentyx PEN 300 mg/2 mL: 0 doses of medicine on hand       REFERRAL TO PHARMACIST     Referral to the pharmacist: Not needed      [facility]     Shipping address confirmed in Epic.     Cost and Payment: Unable to determine copay at this time as the prescription requires a prior authorization/financial assistance. Patient is aware that shipment will be held until copay has been approved and payment information collected, if needed.    Delivery Scheduled: Yes, Expected medication delivery date: 05/03/24.     Medication will be delivered via UPS to the prescription address in Epic [place].    Jadan Hinojos M Santer Torres   [place] [facility] and Home Delivery Pharmacy  Specialty Technician

## 2024-05-02 MED FILL — COSENTYX UNOREADY PEN 300 MG/2 ML SUBCUTANEOUS PEN INJECTOR: SUBCUTANEOUS | 28 days supply | Qty: 4 | Fill #8

## 2024-05-22 DIAGNOSIS — L732 Hidradenitis suppurativa: Principal | ICD-10-CM

## 2024-05-22 MED ADMIN — triamcinolone acetonide (KENALOG-40) injection 40 mg: 40 mg | INTRALESIONAL | @ 19:00:00 | Stop: 2024-05-22

## 2024-05-22 NOTE — Unmapped (Signed)
 [facility] Specialty and Home Delivery Pharmacy Refill Coordination Note    Specialty Medication(s) to be Shipped:   Inflammatory Disorders: Cosentyx     Other medication(s) to be shipped: No additional medications requested for fill at this time     Teresa Bowman, DOB: 08/07/1981  Phone: There are no phone numbers on file.      All above HIPAA information was verified with patient.     Was a Nurse, learning disability used for this call? No    Completed refill call assessment today to schedule patient's medication shipment from the [facility] and Home Delivery Pharmacy  [phone]).  All relevant notes have been reviewed.     Specialty medication(s) and dose(s) confirmed: Regimen is correct and unchanged.   Changes to medications: Dorean reports no changes at this time.  Changes to insurance: No  New side effects reported not previously addressed with a pharmacist or physician: None reported  Questions for the pharmacist: No    Confirmed patient received a Conservation officer, historic buildings and a Surveyor, mining with first shipment. The patient will receive a drug information handout for each medication shipped and additional FDA Medication Guides as required.       DISEASE/MEDICATION-SPECIFIC INFORMATION        For patients on injectable medications: Patient currently has 0 doses left.  Next injection is scheduled for 07/12.    SPECIALTY MEDICATION ADHERENCE     Medication Adherence    Patient reported X missed doses in the last month: 0  Specialty Medication: secukinumab  (COSENTYX  UNOREADY PEN) 300 mg/2 mL PnIj  Patient is on additional specialty medications: No              Were doses missed due to medication being on hold? No    COSENTYX  UNOREADY PEN 300 mg/2 mL Pnij (secukinumab ): 0 doses of medicine on hand     REFERRAL TO PHARMACIST     Referral to the pharmacist: Not needed      [facility]     Shipping address confirmed in Epic.     Cost and Payment: Patient has a copay of $4. They are aware and have authorized the pharmacy to charge the credit card on file.    Delivery Scheduled: Yes, Expected medication delivery date: 05/29/24.     Medication will be delivered via UPS to the prescription address in Epic [facility].    Giovannie Scerbo   [place] Special[facility] and Home Delivery Pharmacy  Specialty Technician

## 2024-05-27 DIAGNOSIS — D485 Neoplasm of uncertain behavior of skin: Principal | ICD-10-CM

## 2024-05-27 DIAGNOSIS — L732 Hidradenitis suppurativa: Principal | ICD-10-CM

## 2024-05-27 DIAGNOSIS — Z79899 Other long term (current) drug therapy: Principal | ICD-10-CM

## 2024-05-27 DIAGNOSIS — B3731 Vagina, candidiasis: Principal | ICD-10-CM

## 2024-05-27 MED ORDER — BIMZELX AUTOINJECTOR 320 MG/2 ML SUBCUTANEOUS AUTO-INJECTOR
SUBCUTANEOUS | 4 refills | 0.00000 days | Status: CP
Start: 2024-05-27 — End: ?
  Filled 2024-06-03: qty 4, 28d supply, fill #0

## 2024-05-27 MED ORDER — FLUCONAZOLE 150 MG TABLET
ORAL_TABLET | ORAL | 1 refills | 0.00000 days | Status: CP
Start: 2024-05-27 — End: ?

## 2024-05-27 NOTE — Unmapped (Addendum)
 Meet your team:     Your intake nurse is: Bernice     Please remember to fill out the survey you will receive after your visit. Your comments help us  continue to improve our care.      Thanks in advance!      [facility] Dermatology Clinical Staff     Punch biopsy  Punch biopsy involves numbing a small area of your skin, then obtaining a sample to help us  make a proper diagnosis of your skin condition. The biopsy site is typically closed with one to 3 small stitches to help the site heal. Biopsy results will usually return in 7-14 days.    To care for the area: Leave the bandage in place until the morning after your procedure is performed. On a daily basis, carefully remove the bandage, then shower or wash as usual. Allow water to run over the site. Please do not scrub. Carefully dry the area, then apply ointment (some people develop an allergy to Neosporin, so we recommend Vaseline or Aquaphor). Cover the site with a fresh bandage. Should any bleeding occur, apply firm pressure for 15 minutes. The treated site will heal best if  a scab never forms (the wound heals by new skin cells traveling from the outside toward the middle-their journey is easier if no scab stands in their way).    Long-term care: the site will be more sensitive than your surrounding skin. Keep it covered, and remember to apply sunscreen every day to all your exposed skin. A scar may remain which is lighter or pinker than your normal skin. Your body will continue to improve your scar for up to one year.    Infection following this procedure is rare. However, if you are worried about the appearance of your site, contact your doctor. Complete healing of the site may take up to one month. We have a physician on call at all times. If you have any concerns about the site, please call our clinic at [phone]    Your stitches should be removed in 1 to 2 weeks. Typically it is about 1 week for the face and 2 weeks for other areas. Please follow the time frame given to you at your appointment.     It was nice to see you today! Your resident physician was Dr. Darrel     If any of your medications are too expensive, look for a coupon at GoodRx.com or reference the application on a smartphone  - Enter the medication name, size, and your zip code to find coupons for local pharmacies.   - Print a coupon and bring it to the pharmacy, or pull up the coupon on a smartphone.  - You can also call pharmacies to ask about the cost of your medication before you pick it up.  If you still cannot afford your medication, please let us  know.     Please call our clinic at [phone] with any concerns or to schedule a follow up appointment. We look forward to seeing you again!     [facility] releases most results to you as soon as they are available. Therefore, you may see some results before we do. Please give us  2 business days to review the tests and contact you by phone or through MyChart. If you are concerned that some results may be upsetting or confusing, you may wish to wait until we contact you before looking at the report in MyChart. If you have an urgent question, you  can send us  a message or call our clinic. Otherwise, we prefer that you wait 2 business days for us  to contact you.

## 2024-05-27 NOTE — Unmapped (Signed)
 Self-reported severity (0-5): 2  VAS pain today: 3  VAS average pain for the last month: 4  Requiring pain medication? Yes.  If so, what type/frequency? Amlodipine Baclofen   How often in pain?  few times a week  Level of odor (0-5): 2  Level of itching (0-5): 2  Dressing changes needed for drainage:No drainage/less than once weekly  How much drainage: no drainage  Flare in the last month (Y/N)? -  How long ago was the last flare? -  Developing new lesions? less than monthly  Number of inflammatory lesions montly: 1-3  DLQI: 14  Current treatment: Cosentyx   How helpful is the current treatment in managing the following aspects of your disease?  Not at all helpful Somewhat helpful Very helpful   Pain   x   Decreasing length of flares   x   Decreasing new lesions     x    Drainage   x   Decreasing frequency of flares    x    Decreasing severity of flares   x   Odor   x

## 2024-05-27 NOTE — Unmapped (Signed)
 I saw and evaluated the patient, participating in the key elements of the service.  I discussed the findings, assessment and plan with the resident and agree with resident???s findings and plan as documented in the resident's note.  I was immediately available for the entirety of the procedure(s) and present for the key and critical portions. Leonette Nutting, MD

## 2024-05-27 NOTE — Unmapped (Signed)
 Dermatology Note     Assessment and Plan:      Hidradenitis Suppurativa, Hurley stage II, chronic, stable on cosentyx  q2wks but feels efficacy is decreasing, mildly flaring in L inguinal today    - Failed adalimumab  80mg  weekly.   - We discussed the typical natural history, pathogenesis, treatment options, and expected course as well as the relapsing and sometimes recalcitrant nature of the disease.    - Patient overall stable but may consider future unroofings (of left inguinal) with Dr. Paulita. She will discuss with him at follow-up.   - Stop Cosentyx  300mg  every 2 weeks. Discussed risks of tuberculosis, other uncommon infections, theoretical risk of malignancies, and other uncommon side effects including oral thrush.  - Start bimekizumab -bkzx (BIMZELX  AUTOINJECTOR) 320 mg/2 mL AtIn; Inject 320 mg every 2 weeks for first 16 weeks (loading dose): week 0, 2, 4, 6, 8, 10, 12, 14, 16  Dispense: 4 mL; Refill: 4  - Start bimekizumab -bkzx (BIMZELX  AUTOINJECTOR) 320 mg/2 mL AtIn; Inject 320 mg (1 pen) every 4 weeks after loading dose for maintenance  Dispense: 2 mL; Refill: 8  - Continue doxycycline  100 mg BID x 2 weeks PRN flares - patient has not been taking recently. Reviewed side effects including GI upset and photosensitivity.  Advised patient to take it with food.   - Continue triamcinolone  0.1% cream BID to affected area for up to 1 week at a time as needed for flares. Appropriate use and side effects of topical steroids discussed.  - Continue clindamycin  1 % lotion; Apply BID to HS as needed until it clears.     Punch excision of hidradenitis suppurativa on R buttocks/perineum: After verbal informed consent was obtained, this site was cleansed with alcohol, anesthetized with 2% lidocaine  with epinephrine  and excision was performed to the subcutaneous fat with a 6 mm punch tool and scissor.  Hemostasis was achieved with pressure and petrolatum and a bandage were applied.  Post care instructions were given.  We will call the patients with results when available.   Indication: hidradenitis suppurativa on R buttocks/perineum    Hx of recurrent vaginal yeast infections  - Diagnosis, treatment options, prognosis, risk/ benefit, and side effects of treatment were discussed with the patient.   - Reviewed that this is likely an adverse side effect of long term oral abx usage rather than cosentyx   - Start fluconazole  150 mg once (can repeat in the future for recurrent candidiasis; refills sent)    The patient was advised to call for an appointment should any new, changing, or symptomatic lesions develop.     RTC: Return in about 6 months (around 11/27/2024). or sooner as needed   _________________________________________________________________      Chief Complaint     Chief Complaint   Patient presents with    HS     Pt stated its alright, new flare ups located in groin area, no drainage.        HPI     Teresa Bowman. Teresa Bowman is a 43 y.o. female who presents as a returning patient (last seen No previous visit found) to Dermatology for follow up of HS. At [facility], continued Cosentyx  q2weeks, clindamycin  lotion, and triamcinolone  cream.     Today, reports:  - Had intra-lesional kenalog   to new lesion of R groin on 05/22/2024 with Donald Nurse; this lesion has calmed down and is no longer symptomatic  - Feels that her left groin has a more persistently flaring area  - States that her Cosentyx   wears off 3-4 days prior to next shots, causing her to feel a bit more pain and inflammation in her problem area of the groin. She would like to change therapy.     Self-reported severity (0-5): 2  VAS pain today: 3  VAS average pain for the last month: 4  Requiring pain medication? Yes.  If so, what type/frequency? Amlodipine Baclofen   How often in pain?  few times a week  Level of odor (0-5): 2  Level of itching (0-5): 2  Dressing changes needed for drainage:No drainage/less than once weekly  How much drainage: no drainage  Flare in the last month (Y/N)? Yes.  How long ago was the last flare? in last 6 months  Developing new lesions? less than monthly  Number of inflammatory lesions montly: 1-3  DLQI: 14  Current treatment: Bimzelx      How helpful is the current treatment in managing the following aspects of your disease?  Not at all helpful Somewhat helpful Very helpful   Pain     x   Decreasing length of flares     x   Decreasing new lesions      x     Drainage     x   Decreasing frequency of flares     x     Decreasing severity of flares     x   Odor     x       The patient denies any other new or changing lesions or areas of concern.     Pertinent Past Medical History       Problem List          Musculoskeletal and Integument    Hidradenitis suppurativa - Primary    Relevant Medications    bimekizumab -bkzx (BIMZELX  AUTOINJECTOR) 320 mg/2 mL AtIn    bimekizumab -bkzx (BIMZELX  AUTOINJECTOR) 320 mg/2 mL AtIn   HS    Past Medical History, Family History, Social History, Medication List, Allergies, and Problem List were reviewed in the rooming section of Epic.     ROS: Other than symptoms mentioned in the HPI, no fevers, chills, or other skin complaints    Physical Examination     GENERAL: Well-appearing female in no acute distress, resting comfortably.  NEURO: Alert and oriented, answers questions appropriately  PSYCH: Normal mood and affect  RESP: No increased work of breathing  SKIN (Focal Skin Exam): Per patient request, examination of bilateral axillae, chest, abdomen, bilateral thighs, groin, buttocks, and external genitalia was performed   location Abscess Inflamed nodule Non-inflamed nodule Draining sinus Non-draining Sinus Hurley % scar   R axilla                 L axilla                 R inframammary                 L inframammary                 Intermammary                 Pubic                 R inguinal                 R thigh                 L inguinal  L thigh                 Scrotum/Vulva      1           Perianal                 R buttock                 L buttock                 Other (list)                                      AN count (total sum of abscess and inflammatory nodule): 0  Pilonidal sinus? No    All areas not commented on are within normal limits or unremarkable      (Approved Template 08/03/2020)

## 2024-05-28 DIAGNOSIS — L732 Hidradenitis suppurativa: Principal | ICD-10-CM

## 2024-05-28 MED FILL — COSENTYX UNOREADY PEN 300 MG/2 ML SUBCUTANEOUS PEN INJECTOR: SUBCUTANEOUS | 28 days supply | Qty: 4 | Fill #9

## 2024-05-28 NOTE — Unmapped (Signed)
 Dermatology Note     Assessment and Plan:      Assessment & Plan  Hidradenitis Suppurativa  Chronic hidradenitis suppurativa with a non-draining inflammatory nodule on the right labia majora, possibly with tunneling. She is familiar with the treatment process.  - Administer Kenalog  40 injection, 0.2 cc into the lesion on the right labia majora to reduce inflammation and pressure. Discussed risks include skin indentation and discoloration.  - Schedule follow-up appointment with Dr. Paulita in one week.    Intralesional Kenalog  Procedure Note: After the patient was informed of risks (including atrophy and dyspigmentation), benefits and side effects of intralesional steroid injection, the patient elected to undergo injection and verbal consent was obtained. Skin was cleaned with alcohol and injected intralesionally into the sites (below). The patient tolerated the procedure well without complications and was instructed on post-procedure care.  Location(s): right labia majora  Number of sites treated: 1  Kenalog  (triamcinolone ) Concentration: 40 mg/ml   Volume: 0.2 ml total      The patient was advised to call for an appointment should any new, changing, or symptomatic lesions develop.     RTC: Return for Next scheduled follow up. or sooner as needed   _________________________________________________________________      Chief Complaint     Chief Complaint   Patient presents with    HS     Active flare to groin area       HPI     Teresa Bowman is a 43 y.o. female who presents as a returning patient (last seen 01/17/2024) to Dermatology for a lesion of concern.     History of Present Illness  Teresa Bowman is a 43 year old female with hidradenitis suppurativa who presents for a steroid injection for a non-draining inflammatory nodule.    She is currently experiencing a non-draining inflammatory nodule in the pubic area, specifically on the right side under a Band-Aid. The nodule is described as 'full of pressure' and is a deeper bump under the skin.    There are two areas affected, which are close to each other and may be connected or 'tunneled.' She has been using a steroid cream as part of her treatment regimen. She has previously undergone steroid injections and is familiar with the procedure and its potential side effects, such as skin indentation or discoloration.    No drainage or bleeding from the nodule. No other systemic symptoms reported.        The patient denies any other new or changing lesions or areas of concern.     Pertinent Past Medical History     No history of skin cancer    Problem List       Hidradenitis suppurativa - Primary       Family History:   Unknown     Past Medical History, Family History, Social History, Medication List, Allergies, and Problem List were reviewed in the rooming section of Epic.     ROS: Other than symptoms mentioned in the HPI, no fevers, chills, or other skin complaints    Physical Examination     GENERAL: Well-appearing female in no acute distress, resting comfortably.  NEURO: Alert and oriented, answers questions appropriately  PSYCH: Normal mood and affect  SKIN: Examination of the genitalia was performed    Physical Exam  GENITOURINARY: Non-draining inflammatory nodule on right labia majora, hard mass under skin.    All areas not commented on are within normal limits or unremarkable      (  Approved Template 08/03/2020)

## 2024-05-29 NOTE — Unmapped (Signed)
 Result Note:    Specimen: A  Diagnosis:  EIC  Site: L labia majora  Tracking:No.  Treatment plan:  observation  MC:ropwpr visit      Patient notified: [facility]

## 2024-05-31 NOTE — Unmapped (Signed)
 Patient is transitioning from Cosentyx  to Bimzelx . We reviewed she could discard last shipment in sharps bin.     [facility]    Patient Onboarding/Medication Counseling    Ms.Teresa Bowman is a 43 y.o. female with hidradenitis suppurativa who I am counseling today on initiation of therapy.  I am speaking to the patient.    Was a Nurse, learning disability used for this call? No    Verified patient's date of birth / HIPAA.    Specialty medication(s) to be sent: Inflammatory Disorders: Bimzelx       Non-specialty medications/supplies to be sent: na      Medications not needed at this time: na         Bimzelx  (bimekizumab )    Medication & Administration     Dosage: Hidradenitis Suppurativa: Inject 320 mg at Week 0, 2, 4, 6, 8, 10, 12, 14 and 16, then every 4 weeks thereafter      Lab tests required prior to treatment initiation:  Tuberculosis: Tuberculosis screening resulted in a non-reactive Quantiferon TB Gold assay.  Liver Function Tests: LFTs, Alk Phos, and bilirubin are documented in the patient's chart.      Administration:     Prefilled auto-injector pen  - 320 mg pen.  Gather all supplies needed for injection on a clean, flat working surface: medication pen removed from packaging, alcohol swab, sharps container, etc.  Look at the medication label - look for correct medication, correct dose, and check the expiration date  Check the medicine through the viewing window. The medicine should be slightly pearly and colorless to pale brownish-yellow and free of particles. You may see air bubbles in the liquid. This is normal. Do not used the pre-filled pen if the medicine is cloudy, discolored, or has particles.   Lay the auto-injector pen on a flat surface and allow it to warm up to room temperature for at least 30-45 minutes  Select injection site - you can use the front of your thigh or your belly (but not the area 2 inches around your belly button); if someone else is giving you the injection you can also use your upper arm in the skin covering your triceps muscle  Prepare injection site - wash your hands and clean the skin at the injection site with an alcohol swab and let it air dry, do not touch the injection site again before the injection  Pull off the safety cap, do not remove until immediately prior to injection and do not touch the green needle cover  Put the green needle cover against your skin at the injection site at a 90 degree angle, hold the pen such that you can see the clear medication window  Press down and hold the pen firmly against your skin, there will be a click when the injection starts  Continue to hold the pen firmly against your skin for about 15 seconds - the window will start to turn solid yellow  There will be a second click sound when the injection is almost complete, verify the window is solid yellow to indicate the injection is complete and then pull the pen away from your skin  Dispose of the used auto-injector pen immediately in your sharps disposal container the needle will be covered automatically  If you see any blood at the injection site, press a cotton ball or gauze on the site and maintain pressure until the bleeding stops, do not rub the injection site    Prefilled syringe  1. Gather all supplies needed for injection on a clean, flat working surface: medication syringe(s) removed from packaging, alcohol swab, sharps container, etc.  2. Look at the medication label - look for correct medication, correct dose, and check the expiration date  3. Look at the medication - the liquid in the syringe should appear slightly pearly and colorless to pale brownish-yellow  4. Lay the syringe on a flat surface and allow it to warm up to room temperature for at least 30-45 minutes  5. Select injection site - you can use the front of your thigh or your belly (but not the area 2 inches around your belly button)  6. Prepare injection site - wash your hands and clean the skin at the injection site with an alcohol swab and let it air dry, do not touch the injection site again before the injection  7. Pull off the needle safety cap, do not remove until immediately prior to injection  8. Pinch the skin - with your hand not holding the syringe pinch up a fold of skin at the injection site using your forefinger and thumb  9. Insert the needle into the fold of skin at about a 45 degree angle - it's best to use a quick dart-like motion  10.Push the plunger down slowly as far as it will go until the syringe is empty  11. Check that the syringe is empty then remove pressure from plunger head. This will cause the needle to automatically retract and lock.    12. Dispose of the used syringe immediately in your sharps disposal container, do not attempt to recap the needle prior to disposing  13. If you see any blood at the injection site, press a cotton ball or gauze on the site and maintain pressure until the bleeding stops, do not rub the injection site      Adherence/Missed dose instructions:  If your injection is given more than 4 days after your scheduled injection date - consult your pharmacist for additional instructions on how to adjust your dosing schedule.        Goals of Therapy     Plaque Psoriasis  Minimize areas of skin involvement (% BSA)  Avoidance of long term glucocorticoid use  Maintenance of effective psychosocial functioning    Hidradenitis Suppurativa  Reduce the frequency and severity of new lesions  Minimize pain and suppuration  Prevent disease progression and limit scarring  Maintenance of effective psychosocial functioning      Side Effects & Monitoring Parameters     Injection site reaction (redness, irritation, inflammation localized to the site of administration)  Signs of a common cold - minor sore throat, runny or stuffy nose, etc.  Diarrhea  Mood changes/suicidal ideation  Liver Function Tests    The following side effects should be reported to the provider:  Signs of a hypersensitivity reaction - rash; hives; itching; red, swollen, blistered, or peeling skin; wheezing; tightness in the chest or throat; difficulty breathing, swallowing, or talking; swelling of the mouth, face, lips, tongue, or throat; etc.  Reduced immune function - report signs of infection such as fever; chills; body aches; very bad sore throat; ear or sinus pain; cough; more sputum or change in color of sputum; pain with passing urine; wound that will not heal, etc.  Also at a slightly higher risk of some malignancies (mainly skin and blood cancers) due to this reduced immune function.  In the case of signs of infection - the patient should  hold the next dose of Bimzelx ?? and call your primary care provider to ensure adequate medical care.  Treatment may be resumed when infection is treated and patient is asymptomatic.  Muscle pain or weakness  Shortness of breath      Warnings, Precautions, & Contraindications     Have your bloodwork checked as you have been told by your prescriber  Talk with your doctor if you are pregnant, planning to become pregnant, or breastfeeding  Discuss the possible need for holding your dose(s) of Bimzelx ?? when a planned procedure is scheduled with the prescriber as it may delay healing/recovery timeline       Drug/Food Interactions     Medication list reviewed in Epic. The patient was instructed to inform the care team before taking any new medications or supplements. No drug interactions identified.   Talk with you prescriber or pharmacist before receiving any live vaccinations while taking this medication and after you stop taking it      Storage, Handling Precautions, & Disposal     Store this medication in the refrigerator.  Do not freeze  May store intact Bimzelx  pens and 160 mg/mL prefilled syringes at <=25??C (<=77??F) for up to 30 days; do NOT return to the refrigerator   Store in original packaging, protected from light  Do not shake  Dispose of used syringes/pens in a sharps disposal container           Current Medications (including OTC/herbals), Comorbidities and Allergies     Current Medications[1]    Allergies[2]    Problem List[3]    Medication list has been reviewed and updated in Epic: Yes    Allergies have been reviewed and updated in Epic: Yes    Appropriateness of Therapy     Acute infections noted within Epic:  No active infections  Patient reported infection: None    Is the medication and dose appropriate based on diagnosis, medication list, comorbidities, allergies, medical history, patient???s ability to self-administer the medication, and therapeutic goals? Yes    Prescription has been clinically reviewed: Yes      Baseline Quality of Life Assessment      How many days over the past month did your HS  keep you from your normal activities? For example, brushing your teeth or getting up in the morning. Patient declined to answer    Financial Information     Medication Assistance provided: Prior Authorization    Anticipated copay of $4 reviewed with patient. Verified delivery address.    Delivery Information     Scheduled delivery date: 7.15    Expected start date: 7/15      Medication will be delivered via UPS to the prescription address in [facility].  This shipment will not require a signature.      Explained the services we provide at [facility] and that each month we would call to set up refills.  Stressed importance of returning phone calls so that we could ensure they receive their medications in time each month.  Informed patient that we should be setting up refills 7-10 days prior to when they will run out of medication.  A pharmacist will reach out to perform a clinical assessment periodically.  Informed patient that a welcome packet, containing information about our pharmacy and other support services, a Notice of Privacy Practices, and a drug information handout will be sent.      The patient or caregiver noted above participated in the development  of this care plan and knows that they can request review of or adjustments to the care plan at any time.      Patient or caregiver verbalized understanding of the above information as well as how to contact the pharmacy at [phone] option 4 with any questions/concerns.  The pharmacy is open Monday through Friday 8:30am-4:30pm.  A pharmacist is available 24/7 via pager to answer any clinical questions they may have.    Patient Specific Needs     Does the patient have any physical, cognitive, or cultural barriers? No    Does the patient have adequate living arrangements? (i.e. the ability to store and take their medication appropriately) Yes    Did you identify any home environmental safety or security hazards? No    Patient prefers to have medications discussed with  Patient     Is the patient or caregiver able to read and understand education materials at a high school level or above? Yes    Patient's primary language is  English     Is the patient high risk? No    Does the patient have an additional or emergency contact listed in their chart? Yes    SOCIAL DETERMINANTS OF HEALTH     At the [facility], we have learned that life circumstances - like trouble affording food, housing, utilities, or transportation can affect the health of many of our patients.   That is why we wanted to ask: are you currently experiencing any life circumstances that are negatively impacting your health and/or quality of life? Patient declined to answer    Social Drivers of Health     Food Insecurity: Medium Risk (05/15/2024)    Received from [facility]    Hunger Vital Sign     Within the past 12 months, you worried that your food would run out before you got money to buy more: Sometimes true     Within the past 12 months, the food you bought just didn't last and you didn't have money to get more. : Never true   Tobacco Use: Medium Risk (05/22/2024)    Patient History     Smoking Tobacco Use: Former     Smokeless Tobacco Use: Never     Passive Exposure: Past   Transportation Needs: Unmet Transportation Needs (05/15/2024)    Received from [employer]     In the past 12 months, has lack of reliable transportation kept you from medical appointments, meetings, work or from getting things needed for daily living? : Yes   Alcohol Use: Not At Risk (08/12/2021)    Alcohol Use     How often do you have a drink containing alcohol?: Never     How many drinks containing alcohol do you have on a typical day when you are drinking?: 1 - 2     How often do you have 5 or more drinks on one occasion?: Never   Housing: Low Risk  (05/15/2024)    Received from [facility] Stability Vital Sign     What is your living situation today?: I have a steady place to live     Think about the place you live. Do you have problems with any of the following? Choose all that apply:: Pt declined to answer   Physical Activity: Not on file   Utilities: Low Risk  (05/15/2024)    Received from [facility]    Utilities  In the past 12 months has the electric, gas, oil, or water company threatened to shut off services in your home? : No   Stress: Not on file   Interpersonal Safety: Not on file   Substance Use: Not on file (09/28/2023)   Intimate Partner Violence: Unknown (02/25/2022)    Received from [facility]    HITS     Physically Hurt: Not on file     Insult or Talk Down To: Not on file     Threaten Physical Harm: Not on file     Scream or Curse: Not on file   Social Connections: Unknown (03/21/2022)    Received from [facility]    Social Network     Social Network: Not on file   Financial Resource Strain: Patient Declined (04/04/2023)    Received from [facility]    Overall Financial Resource Strain (CARDIA)     Difficulty of Paying Living Expenses: Patient declined   Health Literacy: Low Risk  (05/10/2021)    Health Literacy     : Never   Internet Connectivity: Not on file       Would you be willing to receive help with any of the needs that you have identified today? Not applicable       Teresa Bowman [facility] Specialty Pharmacist         [1]   Current Outpatient Medications   Medication Sig Dispense Refill    baclofen  (LIORESAL ) 10 MG tablet Take 1 tablet (10 mg total) by mouth Three (3) times a day.      bimekizumab -bkzx (BIMZELX  AUTOINJECTOR) 320 mg/2 mL AtIn Inject the contents of 1 pen (320 mg) under the skin every 4 weeks after loading dose for maintenance 2 mL 8    bimekizumab -bkzx (BIMZELX  AUTOINJECTOR) 320 mg/2 mL AtIn Inject the contents of 1 pen (320 mg) under the skin  every 2 weeks for first 16 weeks (loading dose): week 0, 2, 4, 6, 8, 10, 12, 14, 16 4 mL 4    clobetasoL  (TEMOVATE ) 0.05 % ointment Twice daily as needed for spot treatment for up to one week on each spot 60 g 5    doxycycline  (VIBRAMYCIN ) 100 MG capsule Take 1 capsule (100 mg total) by mouth two (2) times a day for 2 weeks for flare 90 capsule 1    DULoxetine  (CYMBALTA ) 60 MG capsule TAKE ONE CAPSULE BY MOUTH ONE TIME DAILY 30 capsule 0    empty container Misc Use as directed to dispose of Humira  Pens 1 each 3    EPINEPHrine  (EPIPEN ) 0.3 mg/0.3 mL injection Inject 0.3 mL (0.3 mg total) into the muscle once for 1 dose. 1 each 0    erythromycin  (ROMYCIN) 5 mg/gram (0.5 %) ophthalmic ointment Administer to the right eye Three (3) times a day. 3.5 g 0    fluconazole  (DIFLUCAN ) 150 MG tablet Take one tablet no more than weekly for yeast infection; repeat as necessary for recurrent infection 6 tablet 1    melatonin 3 mg Tab Take 1 tablet (3 mg total) by mouth every evening. 30 tablet 11    meloxicam  (MOBIC ) 15 MG tablet TAKE 1 TABLET BY MOUTH DAILY 90 tablet 3    MOUNJARO 12.5 mg/0.5 mL PnIj PLEASE SEE ATTACHED FOR DETAILED DIRECTIONS      multivitamin (THERAGRAN) per tablet Take 1 tablet by mouth daily.      secukinumab  (COSENTYX  UNOREADY PEN) 300 mg/2 mL (150  mg/mL) PnIj Inject the contents of 1 pen (300mg ) under the skin weekly for 5 weeks as loading doses (on weeks 0,1,2,3,4) 10 mL 0    secukinumab  (COSENTYX  UNOREADY PEN) 300 mg/2 mL PnIj Inject the contents of 1 pen (300 mg) under the skin every fourteen (14) days. 4 mL 10    spironolactone  (ALDACTONE ) 100 MG tablet Take 1 tablet (100 mg total) by mouth daily. 90 tablet 3    triamcinolone  (KENALOG ) 0.1 % cream Twice daily for up to 2 weeks for healing areas 80 g 2    triamcinolone  (KENALOG ) 0.1 % cream Apply topically two (2) times a day. 453.6 g 2     Current Facility-Administered Medications   Medication Dose Route Frequency Provider Last Rate Last Admin    triamcinolone  acetonide (KENALOG -40) injection 40 mg  40 mg Intralesional Once Sayed, Christopher John, MD       [2]   Allergies  Allergen Reactions    Naltrexone Swelling    Augmentin [Amoxicillin-Pot Clavulanate] Rash    Ceclor [Cefaclor] Rash    Penicillins Rash   [3]   Patient Active Problem List  Diagnosis    Bilateral low back pain with left-sided sciatica    Pain in thoracic spine at multiple sites    Allergic rhinitis    Morbid obesity (CMS-HCC)    Anxiety    Chronic pain syndrome    Mild intermittent asthma without complication (HHS-HCC)    Vitamin D deficiency    Mixed incontinence    Anxiety, generalized    Depression    Insomnia    Long-term use of hydroxychloroquine     Suspected UTI    Hidradenitis suppurativa    Asthma (HHS-HCC)    Atopic dermatitis    Blood coagulation disorder (HHS-HCC)    Chronic sinusitis    Delayed sleep phase syndrome    Inflammatory polyarthropathy       Leukocytosis    Lumbar radiculopathy    Mixed stress and urge urinary incontinence    Trochanteric bursitis of left hip    Fibromyalgia    Low back pain

## 2024-05-31 NOTE — Unmapped (Signed)
 [facility] Specialty Medication Onboarding    Specialty Medication: Bimzelx   Prior Authorization: Approved   Financial Assistance: No - copay  <$25  Final Copay/Day Supply: $4 / 28 days (LD) & 84 days (MD)    Insurance Restrictions: Yes - max 1 month supply  (LD only)    Notes to Pharmacist:   Credit Card on File: yes  Start Date on Rx:      The triage team has completed the benefits investigation and has determined that the patient is able to fill this medication at [facility]. Please contact the patient to complete the onboarding or follow up with the prescribing physician as needed.

## 2024-06-18 NOTE — Unmapped (Signed)
 Teresa Bowman reports her Bimzelx  therapy is going well so far - She's had 1 flare but it resolved quickly. She reports some body pain- unclear if it's related to new medication but she'll continue to monitor.     [facility] Clinical Assessment & Refill Coordination Note    Teresa Bowman, [place]: 1980-12-17  Phone: There are no phone numbers on file.    All above HIPAA information was verified with patient.     Was a Nurse, learning disability used for this call? No    Specialty Medication(s):   Inflammatory Disorders: Bimzelx      Current Medications[1]     Changes to medications: Teresa Bowman reports no changes at this time.    Medication list has been reviewed and updated in Epic: Yes    Allergies[2]    Changes to allergies: No    Allergies have been reviewed and updated in Epic: Yes    SPECIALTY MEDICATION ADHERENCE     Bimzelx  - 0 left    Medication Adherence    Patient reported X missed doses in the last month: 0  Specialty Medication: Bimzelx           Specialty medication(s) dose(s) confirmed: Regimen is correct and unchanged.     Are there any concerns with adherence? No    Adherence counseling provided? Not needed    CLINICAL MANAGEMENT AND INTERVENTION      Clinical Benefit Assessment:    Do you feel the medicine is effective or helping your condition? Yes    Clinical Benefit counseling provided? Not needed    Adverse Effects Assessment:    Are you experiencing any side effects? Possibly - has noticed more body aches. Is reported in ~ 2 percent of people. Patient reports limits activities slightly. Will continue to monitor and report worsening.     Are you experiencing difficulty administering your medicine? No    Quality of Life Assessment:    Quality of Life    Rheumatology  Oncology  Dermatology  1. What impact has your specialty medication had on the symptoms of your skin condition (i.e. itchiness, soreness, stinging)?: Some  2. What impact has your specialty medication had on your comfort level with your skin?: Some  Cystic Fibrosis          How many days over the past month did your HS  keep you from your normal activities? For example, brushing your teeth or getting up in the morning. Patient declined to answer    Have you discussed this with your provider? Not needed    Acute Infection Status:    Acute infections noted within Epic:  No active infections    Patient reported infection: None    Therapy Appropriateness:    Is therapy appropriate based on current medication list, adverse reactions, adherence, clinical benefit and progress toward achieving therapeutic goals? Yes, therapy is appropriate and should be continued     Clinical Intervention:    Was an intervention completed as part of this clinical assessment? No    DISEASE/MEDICATION-SPECIFIC INFORMATION      For patients on injectable medications: Patient currently has 0 doses left.  Next injection is scheduled for 8/12 and 8/26.    Chronic Inflammatory Diseases: Have you experienced any flares in the last month? Yes, one flare but it resolved quickly. Seems improved from baseline per patient.   Has this been reported to your provider? No    PATIENT SPECIFIC NEEDS     Does the patient  have any physical, cognitive, or cultural barriers? No    Is the patient high risk? No    Does the patient require physician intervention or other additional services (i.e., nutrition, smoking cessation, social work)? No    Does the patient have an additional or emergency contact listed in their chart? Yes    SOCIAL DETERMINANTS OF HEALTH     At the [facility], we have learned that life circumstances - like trouble affording food, housing, utilities, or transportation can affect the health of many of our patients.   That is why we wanted to ask: are you currently experiencing any life circumstances that are negatively impacting your health and/or quality of life? Patient declined to answer    Social Drivers of Health     Food Insecurity: Medium Risk (05/15/2024)    Received from [facility]    Hunger Vital Sign     Within the past 12 months, you worried that your food would run out before you got money to buy more: Sometimes true     Within the past 12 months, the food you bought just didn't last and you didn't have money to get more. : Never true   Tobacco Use: Medium Risk (05/22/2024)    Patient History     Smoking Tobacco Use: Former     Smokeless Tobacco Use: Never     Passive Exposure: Past   Transportation Needs: Unmet Transportation Needs (05/15/2024)    Received from [employer]     In the past 12 months, has lack of reliable transportation kept you from medical appointments, meetings, work or from getting things needed for daily living? : Yes   Alcohol Use: Not At Risk (08/12/2021)    Alcohol Use     How often do you have a drink containing alcohol?: Never     How many drinks containing alcohol do you have on a typical day when you are drinking?: 1 - 2     How often do you have 5 or more drinks on one occasion?: Never   Housing: Low Risk  (05/15/2024)    Received from [facility] Stability Vital Sign     What is your living situation today?: I have a steady place to live     Think about the place you live. Do you have problems with any of the following? Choose all that apply:: Pt declined to answer   Physical Activity: Not on file   Utilities: Low Risk  (05/15/2024)    Received from [facility]    Utilities     In the past 12 months has the electric, gas, oil, or water company threatened to shut off services in your home? : No   Stress: Not on file   Interpersonal Safety: Not on file   Substance Use: Not on file (09/28/2023)   Intimate Partner Violence: Unknown (02/25/2022)    Received from [facility]    HITS     Physically Hurt: Not on file     Insult or Talk Down To: Not on file     Threaten Physical Harm: Not on file     Scream or Curse: Not on file   Social Connections: Unknown (03/21/2022)    Received from [facility]     Social Network: Not on file   Financial Resource Strain: Patient Declined (04/04/2023)    Received from [facility]    Overall  Financial Resource Strain (CARDIA)     Difficulty of Paying Living Expenses: Patient declined   Health Literacy: Low Risk  (05/10/2021)    Health Literacy     : Never   Internet Connectivity: Not on file       Would you be willing to receive help with any of the needs that you have identified today? Not applicable       SHIPPING     Specialty Medication(s) to be Shipped:   Inflammatory Disorders: Bimzelx     Other medication(s) to be shipped: No additional medications requested for fill at this time     Changes to insurance: No    Cost and Payment: Patient has a copay of $4. They are aware and have authorized the pharmacy to charge the credit card on file.    Delivery Scheduled: Yes, Expected medication delivery date: 8/6.     Medication will be delivered via UPS to the confirmed prescription address in [facility].    The patient will receive a drug information handout for each medication shipped and additional FDA Medication Guides as required.  Verified that patient has previously received a Conservation officer, historic buildings and a Surveyor, mining.    The patient or caregiver noted above participated in the development of this care plan and knows that they can request review of or adjustments to the care plan at any time.      All of the patient's questions and concerns have been addressed.    Kharter Brew [facility] Specialty Pharmacist         [1]   Current Outpatient Medications   Medication Sig Dispense Refill    baclofen  (LIORESAL ) 10 MG tablet Take 1 tablet (10 mg total) by mouth Three (3) times a day.      bimekizumab -bkzx (BIMZELX  AUTOINJECTOR) 320 mg/2 mL AtIn Inject the contents of 1 pen (320 mg) under the skin every 4 weeks after loading dose for maintenance 2 mL 8    bimekizumab -bkzx (BIMZELX  AUTOINJECTOR) 320 mg/2 mL AtIn Inject the contents of 1 pen (320 mg) under the skin  every 2 weeks for first 16 weeks (loading dose): week 0, 2, 4, 6, 8, 10, 12, 14, 16 4 mL 4    clobetasoL  (TEMOVATE ) 0.05 % ointment Twice daily as needed for spot treatment for up to one week on each spot 60 g 5    doxycycline  (VIBRAMYCIN ) 100 MG capsule Take 1 capsule (100 mg total) by mouth two (2) times a day for 2 weeks for flare 90 capsule 1    DULoxetine  (CYMBALTA ) 60 MG capsule TAKE ONE CAPSULE BY MOUTH ONE TIME DAILY 30 capsule 0    empty container Misc Use as directed to dispose of Humira  Pens 1 each 3    EPINEPHrine  (EPIPEN ) 0.3 mg/0.3 mL injection Inject 0.3 mL (0.3 mg total) into the muscle once for 1 dose. 1 each 0    erythromycin  (ROMYCIN) 5 mg/gram (0.5 %) ophthalmic ointment Administer to the right eye Three (3) times a day. 3.5 g 0    fluconazole  (DIFLUCAN ) 150 MG tablet Take one tablet no more than weekly for yeast infection; repeat as necessary for recurrent infection 6 tablet 1    melatonin 3 mg Tab Take 1 tablet (3 mg total) by mouth every evening. 30 tablet 11    meloxicam  (MOBIC ) 15 MG tablet TAKE 1 TABLET BY MOUTH DAILY 90 tablet 3    MOUNJARO 12.5 mg/0.5  mL PnIj PLEASE SEE ATTACHED FOR DETAILED DIRECTIONS      multivitamin (THERAGRAN) per tablet Take 1 tablet by mouth daily.      spironolactone  (ALDACTONE ) 100 MG tablet Take 1 tablet (100 mg total) by mouth daily. 90 tablet 3    triamcinolone  (KENALOG ) 0.1 % cream Twice daily for up to 2 weeks for healing areas 80 g 2    triamcinolone  (KENALOG ) 0.1 % cream Apply topically two (2) times a day. 453.6 g 2     Current Facility-Administered Medications   Medication Dose Route Frequency Provider Last Rate Last Admin    triamcinolone  acetonide (KENALOG -40) injection 40 mg  40 mg Intralesional Once Sayed, Christopher John, MD       [2]   Allergies  Allergen Reactions    Naltrexone Swelling    Augmentin [Amoxicillin-Pot Clavulanate] Rash    Ceclor [Cefaclor] Rash    Penicillins Rash

## 2024-06-19 NOTE — Unmapped (Signed)
 Rosaline Rockers Pompey 's bimekizumab -bkzx: BIMZELX  AUTOINJECTOR 320 mg/2 mL Atin shipment will be canceled as a result of pt requested     I have reached out to the patient  via incoming phone call and communicated the delivery change. We will not reschedule the medication and have removed this/these medication(s) from the work request.  We have canceled this work request.

## 2024-06-25 DIAGNOSIS — L732 Hidradenitis suppurativa: Principal | ICD-10-CM

## 2024-06-25 MED ORDER — COSENTYX PEN 300 MG/2 PENS (150 MG/ML) SUBCUTANEOUS PEN INJECTOR
SUBCUTANEOUS | 11 refills | 28.00000 days | Status: CP
Start: 2024-06-25 — End: ?
  Filled 2024-07-01: qty 6, 21d supply, fill #0

## 2024-06-25 MED ORDER — COSENTYX PEN 150 MG/ML SUBCUTANEOUS PEN INJECTOR
SUBCUTANEOUS | 0 refills | 35.00000 days | Status: CP
Start: 2024-06-25 — End: 2024-07-24

## 2024-06-26 NOTE — Unmapped (Signed)
 [facility] Specialty Medication Onboarding    Specialty Medication: Cosentyx   Prior Authorization: Approved   Financial Assistance: No - copay  <$25  Final Copay/Day Supply: $4 / 56 days (LD) & 84 days (MD)    Insurance Restrictions: None     Notes to Pharmacist:   Credit Card on File: yes  Start Date on Rx:      The triage team has completed the benefits investigation and has determined that the patient is able to fill this medication at [facility]. Please contact the patient to complete the onboarding or follow up with the prescribing physician as needed.

## 2024-06-26 NOTE — Unmapped (Addendum)
 Patient wishes to resume on Cosentyx  - approved by providers. She was having muscle pains. She has already started back on Cosentyx  (2 doses so far), so will fill 3 pens to complete load.     She's had no flaring and reports her joint pains are improving since she's taken no further doses on Bimzelx .       Will reach out to clinic to adjust senso -> Uno pens.   [facility] Clinical Assessment & Refill Coordination Note    Teresa Bowman, [place]: [birth year]  Phone: There are no phone numbers on file.    All above HIPAA information was verified with patient.     Was a Nurse, learning disability used for this call? No    Specialty Medication(s):   Inflammatory Disorders: Cosentyx      Current Medications[1]     Changes to medications: Vernette reports no changes at this time.    Medication list has been reviewed and updated in Epic: Yes    Allergies[2]    Changes to allergies: No    Allergies have been reviewed and updated in Epic: Yes    SPECIALTY MEDICATION ADHERENCE     Cosentyx  - 0 left  Medication Adherence    Patient reported X missed doses in the last month: 1  Specialty Medication: Cosentyx  (stopped to start other medication)          Specialty medication(s) dose(s) confirmed: Regimen is correct and unchanged.     Are there any concerns with adherence? No    Adherence counseling provided? Not needed    CLINICAL MANAGEMENT AND INTERVENTION      Clinical Benefit Assessment:    Do you feel the medicine is effective or helping your condition? Yes    Clinical Benefit counseling provided? Not needed    Adverse Effects Assessment:    Are you experiencing any side effects? No    Are you experiencing difficulty administering your medicine? No    Quality of Life Assessment:    Quality of Life    Rheumatology  Oncology  Dermatology  1. What impact has your specialty medication had on the symptoms of your skin condition (i.e. itchiness, soreness, stinging)?: Some  2. What impact has your specialty medication had on your comfort level with your skin?: Some  Cystic Fibrosis          How many days over the past month did your HS  keep you from your normal activities? For example, brushing your teeth or getting up in the morning. Patient declined to answer    Have you discussed this with your provider? Not needed    Acute Infection Status:    Acute infections noted within Epic:  No active infections    Patient reported infection: None    Therapy Appropriateness:    Is therapy appropriate based on current medication list, adverse reactions, adherence, clinical benefit and progress toward achieving therapeutic goals? Yes, therapy is appropriate and should be continued     Clinical Intervention:    Was an intervention completed as part of this clinical assessment? No    DISEASE/MEDICATION-SPECIFIC INFORMATION      For patients on injectable medications: Patient currently has 0 doses left.  Next injection is scheduled for 8/12.    Chronic Inflammatory Diseases: Have you experienced any flares in the last month? No  Has this been reported to your provider? No    PATIENT SPECIFIC NEEDS     Does the patient have any physical,  cognitive, or cultural barriers? No    Is the patient high risk? No    Does the patient require physician intervention or other additional services (i.e., nutrition, smoking cessation, social work)? No    Does the patient have an additional or emergency contact listed in their chart? Yes    SOCIAL DETERMINANTS OF HEALTH     At the [facility], we have learned that life circumstances - like trouble affording food, housing, utilities, or transportation can affect the health of many of our patients.   That is why we wanted to ask: are you currently experiencing any life circumstances that are negatively impacting your health and/or quality of life? Patient declined to answer    Social Drivers of Health     Food Insecurity: Medium Risk (05/15/2024)    Received from [facility]    Hunger Vital Sign Within the past 12 months, you worried that your food would run out before you got money to buy more: Sometimes true     Within the past 12 months, the food you bought just didn't last and you didn't have money to get more. : Never true   Tobacco Use: Medium Risk (05/22/2024)    Patient History     Smoking Tobacco Use: Former     Smokeless Tobacco Use: Never     Passive Exposure: Past   Transportation Needs: Unmet Transportation Needs (05/15/2024)    Received from [employer]     In the past 12 months, has lack of reliable transportation kept you from medical appointments, meetings, work or from getting things needed for daily living? : Yes   Alcohol Use: Not At Risk (08/12/2021)    Alcohol Use     How often do you have a drink containing alcohol?: Never     How many drinks containing alcohol do you have on a typical day when you are drinking?: 1 - 2     How often do you have 5 or more drinks on one occasion?: Never   Housing: Low Risk  (05/15/2024)    Received from [facility] Stability Vital Sign     What is your living situation today?: I have a steady place to live     Think about the place you live. Do you have problems with any of the following? Choose all that apply:: Pt declined to answer   Physical Activity: Not on file   Utilities: Low Risk  (05/15/2024)    Received from [facility]    Utilities     In the past 12 months has the electric, gas, oil, or water company threatened to shut off services in your home? : No   Stress: Not on file   Interpersonal Safety: Not on file   Substance Use: Not on file (09/28/2023)   Intimate Partner Violence: Unknown (02/25/2022)    Received from [facility]    HITS     Physically Hurt: Not on file     Insult or Talk Down To: Not on file     Threaten Physical Harm: Not on file     Scream or Curse: Not on file   Social Connections: Unknown (03/21/2022)    Received from [facility]    Social Network     Social Network: Not on file   Financial Resource Strain: Patient Declined (04/04/2023)    Received from [facility]    Overall Financial Resource Strain (CARDIA)  Difficulty of Paying Living Expenses: Patient declined   Health Literacy: Low Risk  (05/10/2021)    Health Literacy     : Never   Internet Connectivity: Not on file       Would you be willing to receive help with any of the needs that you have identified today? Not applicable       SHIPPING     Specialty Medication(s) to be Shipped:   Inflammatory Disorders: Cosentyx     Other medication(s) to be shipped: No additional medications requested for fill at this time     Changes to insurance: No    Cost and Payment: Patient has a copay of $4. They are aware and have authorized the pharmacy to charge the credit card on file.    Delivery Scheduled: Yes, Expected medication delivery date: 8/12.     Medication will be delivered via UPS to the confirmed prescription address in [facility].    The patient will receive a drug information handout for each medication shipped and additional FDA Medication Guides as required.  Verified that patient has previously received a Conservation officer, historic buildings and a Surveyor, mining.    The patient or caregiver noted above participated in the development of this care plan and knows that they can request review of or adjustments to the care plan at any time.      All of the patient's questions and concerns have been addressed.    Alaja Goldinger [facility] Specialty Pharmacist         [1]   Current Outpatient Medications   Medication Sig Dispense Refill    baclofen  (LIORESAL ) 10 MG tablet Take 1 tablet (10 mg total) by mouth Three (3) times a day.      bimekizumab -bkzx (BIMZELX  AUTOINJECTOR) 320 mg/2 mL AtIn Inject the contents of 1 pen (320 mg) under the skin every 4 weeks after loading dose for maintenance 2 mL 8    bimekizumab -bkzx (BIMZELX  AUTOINJECTOR) 320 mg/2 mL AtIn Inject the contents of 1 pen (320 mg) under the skin every 2 weeks for first 16 weeks (loading dose): week 0, 2, 4, 6, 8, 10, 12, 14, 16 4 mL 4    clobetasoL  (TEMOVATE ) 0.05 % ointment Twice daily as needed for spot treatment for up to one week on each spot 60 g 5    doxycycline  (VIBRAMYCIN ) 100 MG capsule Take 1 capsule (100 mg total) by mouth two (2) times a day for 2 weeks for flare 90 capsule 1    DULoxetine  (CYMBALTA ) 60 MG capsule TAKE ONE CAPSULE BY MOUTH ONE TIME DAILY 30 capsule 0    empty container Misc Use as directed to dispose of Humira  Pens 1 each 3    EPINEPHrine  (EPIPEN ) 0.3 mg/0.3 mL injection Inject 0.3 mL (0.3 mg total) into the muscle once for 1 dose. 1 each 0    erythromycin  (ROMYCIN) 5 mg/gram (0.5 %) ophthalmic ointment Administer to the right eye Three (3) times a day. 3.5 g 0    fluconazole  (DIFLUCAN ) 150 MG tablet Take one tablet no more than weekly for yeast infection; repeat as necessary for recurrent infection 6 tablet 1    melatonin 3 mg Tab Take 1 tablet (3 mg total) by mouth every evening. 30 tablet 11    meloxicam  (MOBIC ) 15 MG tablet TAKE 1 TABLET BY MOUTH DAILY 90 tablet 3    MOUNJARO 12.5 mg/0.5 mL PnIj PLEASE SEE ATTACHED FOR DETAILED DIRECTIONS  multivitamin (THERAGRAN) per tablet Take 1 tablet by mouth daily.      secukinumab  (COSENTYX  PEN, 2 PENS,) 150 mg/mL PnIj injection Inject the contents of 2 pens (300 mg total) under the skin every fourteen (14) days. 4 mL 11    spironolactone  (ALDACTONE ) 100 MG tablet Take 1 tablet (100 mg total) by mouth daily. 90 tablet 3    triamcinolone  (KENALOG ) 0.1 % cream Twice daily for up to 2 weeks for healing areas 80 g 2    triamcinolone  (KENALOG ) 0.1 % cream Apply topically two (2) times a day. 453.6 g 2     Current Facility-Administered Medications   Medication Dose Route Frequency Provider Last Rate Last Admin    triamcinolone  acetonide (KENALOG -40) injection 40 mg  40 mg Intralesional Once Sayed, Christopher John, MD       [2]   Allergies  Allergen Reactions    Naltrexone Swelling Augmentin [Amoxicillin-Pot Clavulanate] Rash    Ceclor [Cefaclor] Rash    Penicillins Rash

## 2024-07-03 DIAGNOSIS — L732 Hidradenitis suppurativa: Principal | ICD-10-CM

## 2024-07-03 MED ORDER — DOXYCYCLINE HYCLATE 100 MG CAPSULE
ORAL_CAPSULE | Freq: Two times a day (BID) | ORAL | 1 refills | 45.00000 days
Start: 2024-07-03 — End: ?

## 2024-07-08 DIAGNOSIS — B370 Candidal stomatitis: Principal | ICD-10-CM

## 2024-07-08 DIAGNOSIS — B3731 Vagina, candidiasis: Principal | ICD-10-CM

## 2024-07-08 MED ORDER — DOXYCYCLINE HYCLATE 100 MG CAPSULE
ORAL_CAPSULE | Freq: Two times a day (BID) | ORAL | 1 refills | 45.00000 days | Status: CP
Start: 2024-07-08 — End: ?

## 2024-07-08 MED ORDER — FLUCONAZOLE 150 MG TABLET
ORAL_TABLET | ORAL | 1 refills | 0.00000 days | Status: CP
Start: 2024-07-08 — End: 2024-07-08

## 2024-07-24 NOTE — Unmapped (Signed)
 [facility] Clinical Assessment & Refill Coordination Note    -Patient feels a lot better since starting back on Cosentyx . She has finished all 5 loading doses     Teresa Bowman, DOB: 09/01/1981  Phone: There are no phone numbers on file.    All above HIPAA information was verified with patient.     Was a Nurse, learning disability used for this call? No    Specialty Medication(s):   Inflammatory Disorders: Cosentyx      Current Medications[1]     Changes to medications: [name] reports no changes at this time.    Medication list has been reviewed and updated in Epic: Yes    Allergies[2]    Changes to allergies: No    Allergies have been reviewed and updated in Epic: Yes    SPECIALTY MEDICATION ADHERENCE     Cosentyx  300/2 mg/ml: 0 doses of medicine on hand       Medication Adherence    Patient reported X missed doses in the last month: 0  Specialty Medication: COsentyx   Patient is on additional specialty medications: No  Informant: patient          Specialty medication(s) dose(s) confirmed: Regimen is correct and unchanged.     Are there any concerns with adherence? No    Adherence counseling provided? Not needed    CLINICAL MANAGEMENT AND INTERVENTION      Clinical Benefit Assessment:    Do you feel the medicine is effective or helping your condition? Yes    Clinical Benefit counseling provided? Seen a lot of benefit so far    Adverse Effects Assessment:    Are you experiencing any side effects? No    Are you experiencing difficulty administering your medicine? No    Quality of Life Assessment:    Quality of Life    Rheumatology  Oncology  Dermatology  1. What impact has your specialty medication had on the symptoms of your skin condition (i.e. itchiness, soreness, stinging)?: Some  2. What impact has your specialty medication had on your comfort level with your skin?: Some  Cystic Fibrosis          How many days over the past month did your HS  keep you from your normal activities? For example, brushing your teeth or getting up in the morning. Patient declined to answer    Have you discussed this with your provider? Yes    Acute Infection Status:    Acute infections noted within Epic:  No active infections    Patient reported infection: None    Therapy Appropriateness:    Is therapy appropriate based on current medication list, adverse reactions, adherence, clinical benefit and progress toward achieving therapeutic goals? Yes, therapy is appropriate and should be continued     Clinical Intervention:    Was an intervention completed as part of this clinical assessment? No    DISEASE/MEDICATION-SPECIFIC INFORMATION      For patients on injectable medications: Next injection is scheduled for 9/9.    Chronic Inflammatory Diseases: Have you experienced any flares in the last month? No    PATIENT SPECIFIC NEEDS     Does the patient have any physical, cognitive, or cultural barriers? No    Is the patient high risk? No    Does the patient require physician intervention or other additional services (i.e., nutrition, smoking cessation, social work)? No    Does the patient have an additional or emergency contact listed in their chart? Yes  SOCIAL DETERMINANTS OF HEALTH     At the [facility], we have learned that life circumstances - like trouble affording food, housing, utilities, or transportation can affect the health of many of our patients.   That is why we wanted to ask: are you currently experiencing any life circumstances that are negatively impacting your health and/or quality of life? Patient declined to answer    Social Drivers of Health     Food Insecurity: Medium Risk (05/15/2024)    Received from [facility]    Hunger Vital Sign     Within the past 12 months, you worried that your food would run out before you got money to buy more: Sometimes true     Within the past 12 months, the food you bought just didn't last and you didn't have money to get more. : Never true   Tobacco Use: Medium Risk (05/22/2024)    Patient History     Smoking Tobacco Use: Former     Smokeless Tobacco Use: Never     Passive Exposure: Past   Transportation Needs: Unmet Transportation Needs (05/15/2024)    Received from [employer]     In the past 12 months, has lack of reliable transportation kept you from medical appointments, meetings, work or from getting things needed for daily living? : Yes   Alcohol Use: Not At Risk (08/12/2021)    Alcohol Use     How often do you have a drink containing alcohol?: Never     How many drinks containing alcohol do you have on a typical day when you are drinking?: 1 - 2     How often do you have 5 or more drinks on one occasion?: Never   Housing: Low Risk  (05/15/2024)    Received from [facility] Stability Vital Sign     What is your living situation today?: I have a steady place to live     Think about the place you live. Do you have problems with any of the following? Choose all that apply:: Pt declined to answer   Physical Activity: Not on file   Utilities: Low Risk  (05/15/2024)    Received from [facility]    Utilities     In the past 12 months has the electric, gas, oil, or water company threatened to shut off services in your home? : No   Stress: Not on file   Interpersonal Safety: Not on file   Substance Use: Not on file (09/28/2023)   Intimate Partner Violence: Unknown (02/25/2022)    Received from [facility]    HITS     Physically Hurt: Not on file     Insult or Talk Down To: Not on file     Threaten Physical Harm: Not on file     Scream or Curse: Not on file   Social Connections: Unknown (03/21/2022)    Received from [facility]    Social Network     Social Network: Not on file   Financial Resource Strain: Patient Declined (04/04/2023)    Received from [facility]    Overall Financial Resource Strain (CARDIA)     Difficulty of Paying Living Expenses: Patient declined   Health Literacy: Low Risk  (05/10/2021)    Health Literacy     : Never Internet Connectivity: Not on file       Would you be willing to receive help with any  of the needs that you have identified today? Not applicable       SHIPPING     Specialty Medication(s) to be Shipped:   Inflammatory Disorders: Cosentyx     Other medication(s) to be shipped: No additional medications requested for fill at this time    Specialty Medications not needed at this time: N/A     Changes to insurance: No    Cost and Payment: Patient has a copay of $4. They are aware and have authorized the pharmacy to charge the credit card on file.    Delivery Scheduled: Yes, Expected medication delivery date: 9/5.     Medication will be delivered via UPS to the confirmed prescription address in [facility].    The patient will receive a drug information handout for each medication shipped and additional FDA Medication Guides as required.  Verified that patient has previously received a Conservation officer, historic buildings and a Surveyor, mining.    The patient or caregiver noted above participated in the development of this care plan and knows that they can request review of or adjustments to the care plan at any time.      All of the patient's questions and concerns have been addressed.    Rosalynn GORMAN Kin, PharmD   [facility] Specialty Pharmacist       [1]   Current Outpatient Medications   Medication Sig Dispense Refill    baclofen  (LIORESAL ) 10 MG tablet Take 1 tablet (10 mg total) by mouth Three (3) times a day.      clobetasoL  (TEMOVATE ) 0.05 % ointment Twice daily as needed for spot treatment for up to one week on each spot 60 g 5    doxycycline  (VIBRAMYCIN ) 100 MG capsule Take 1 capsule (100 mg total) by mouth two (2) times a day for 2 weeks for flare 90 capsule 1    DULoxetine  (CYMBALTA ) 60 MG capsule TAKE ONE CAPSULE BY MOUTH ONE TIME DAILY 30 capsule 0    empty container Misc Use as directed to dispose of Humira  Pens 1 each 3    EPINEPHrine  (EPIPEN ) 0.3 mg/0.3 mL injection Inject 0.3 mL (0.3 mg total) into the muscle once for 1 dose. 1 each 0    erythromycin  (ROMYCIN) 5 mg/gram (0.5 %) ophthalmic ointment Administer to the right eye Three (3) times a day. 3.5 g 0    fluconazole  (DIFLUCAN ) 150 MG tablet Take one tablet no more than weekly for yeast infection; repeat as necessary for recurrent infection 6 tablet 1    melatonin 3 mg Tab Take 1 tablet (3 mg total) by mouth every evening. 30 tablet 11    meloxicam  (MOBIC ) 15 MG tablet TAKE 1 TABLET BY MOUTH DAILY 90 tablet 3    MOUNJARO 12.5 mg/0.5 mL PnIj PLEASE SEE ATTACHED FOR DETAILED DIRECTIONS      multivitamin (THERAGRAN) per tablet Take 1 tablet by mouth daily.      secukinumab  (COSENTYX  UNOREADY PEN) 300 mg/2 mL PnIj Inject the contents of 1 pen (300mg ) under the skin weekly for 5 weeks as loading doses (on weeks 0,1,2,3,4) 10 mL 0    secukinumab  (COSENTYX  UNOREADY PEN) 300 mg/2 mL PnIj Inject the contents of 1 pen (300 mg) under the skin every fourteen (14) days. 4 mL 11    spironolactone  (ALDACTONE ) 100 MG tablet Take 1 tablet (100 mg total) by mouth daily. 90 tablet 3    triamcinolone  (KENALOG ) 0.1 % cream Twice daily for up to 2  weeks for healing areas 80 g 2     Current Facility-Administered Medications   Medication Dose Route Frequency Provider Last Rate Last Admin    triamcinolone  acetonide (KENALOG -40) injection 40 mg  40 mg Intralesional Once Sayed, Christopher John, MD       [2]   Allergies  Allergen Reactions    Naltrexone Swelling    Augmentin [Amoxicillin-Pot Clavulanate] Rash    Ceclor [Cefaclor] Rash    Penicillins Rash

## 2024-07-25 MED FILL — COSENTYX UNOREADY PEN 300 MG/2 ML SUBCUTANEOUS PEN INJECTOR: SUBCUTANEOUS | 28 days supply | Qty: 4 | Fill #0

## 2024-08-15 DIAGNOSIS — L732 Hidradenitis suppurativa: Principal | ICD-10-CM

## 2024-08-15 MED ADMIN — triamcinolone acetonide (KENALOG-40) injection 40 mg: 40 mg | @ 21:00:00 | Stop: 2024-08-15

## 2024-08-15 NOTE — Unmapped (Signed)
 Dermatology Note     Assessment and Plan:      Hidradenitis Suppurativa, Hurley stage II, Chronic: flared or not at treatment goal  - Failed adalimumab  80mg  weekly, felt unwell on bimzelx  (body aches) and now back on cosentyx   - Previously we discussed the typical natural history, pathogenesis, treatment options, and expected course as well as the relapsing and sometimes recalcitrant nature of the disease.    - Patient overall stable but may consider future unroofings (of left inguinal) with Dr. Paulita. She will discuss with him at follow-up.   - Continue Cosentyx  300mg  every 2 weeks. Discussed risks of tuberculosis, other uncommon infections, theoretical risk of malignancies, and other uncommon side effects including oral thrush.  - Continue doxycycline  100 mg BID x 2 weeks PRN flares - patient has not been taking recently. Reviewed side effects including GI upset and photosensitivity.  Advised patient to take it with food.   - Continue triamcinolone  0.1% cream BID to affected area for up to 1 week at a time as needed for flares. Appropriate use and side effects of topical steroids discussed.  - Continue clindamycin  1 % lotion; Apply BID to HS as needed until it clears.  - Patient presented for intra-lesional kenalog  today iso flare  Intralesional Kenalog  Procedure Note: After the patient was informed of risks (including atrophy and dyspigmentation), benefits and side effects of intralesional steroid injection, the patient elected to undergo injection and verbal consent was obtained. Skin was cleaned with alcohol and injected intralesionally into the sites (below). The patient tolerated the procedure well without complications and was instructed on post-procedure care.  Location(s): L vulva  Number of sites treated: 1  Kenalog  (triamcinolone ) Concentration: 40 mg/ml   Volume: 0.3 ml total      Education was provided by discussing the etiology, natural history, course and treatment for the above conditions. Reassurance and anticipatory guidance were provided.     The patient was advised to call for an appointment should any new, changing, or symptomatic lesions develop.     RTC: Return if symptoms worsen or fail to improve, for HS. or sooner as needed   _________________________________________________________________      Chief Complaint     Chief Complaint   Patient presents with    Hidradenitis Suppurativa     Pt states she is having a flare and is requesting HS injections        HPI     Teresa Quade. Raneri is a 43 y.o. female who presents as a returning patient (last seen 05/27/2024) to Dermatology for intra-lesional kenalog   iso HS flare. Lesion arose in L vulva 3 days ago. It is painful.      Pertinent Past Medical History     Problem List          Musculoskeletal and Integument    Hidradenitis suppurativa - Primary    Relevant Medications    triamcinolone  acetonide (KENALOG -40) injection 40 mg (Completed) (Start on 08/15/2024  5:00 PM)       Past Medical History, Family History, Social History, Medication List, Allergies, and Problem List were reviewed in the rooming section of Epic.     ROS: Other than symptoms mentioned in the HPI, no fevers, chill, or other skin complaints.     Physical Examination     GENERAL: Well-appearing female in no acute distress, resting comfortably.  NEURO: Alert and oriented, answers questions appropriately  PSYCH: Normal mood and affect  RESP: No increased work of breathing  SKIN (  Focal Skin Exam): Per patient request, examination of L vulva was performed    - 1 inflamed nodule with associated nondraining tunnel in L vulva    All areas not commented on are within normal limits or unremarkable      (Approved Template 08/03/2020)

## 2024-08-20 NOTE — Unmapped (Signed)
 [facility] Refill Coordination Note    Specialty Medication(s) to be Shipped:   Neurology: Copaxone    Other medication(s) to be shipped: No additional medications requested for fill at this time    Specialty Medications not needed at this time: N/A     Teresa Bowman, DOB: October 09, 1981  Phone: There are no phone numbers on file.      All above HIPAA information was verified with patient.     Was a Nurse, learning disability used for this call? No    Completed refill call assessment today to schedule patient's medication shipment from the [facility]  [phone]).  All relevant notes have been reviewed.     Specialty medication(s) and dose(s) confirmed: Regimen is correct and unchanged.   Changes to medications: Kennidy reports no changes at this time.  Changes to insurance: No  New side effects reported not previously addressed with a pharmacist or physician: None reported  Questions for the pharmacist: No    Confirmed patient received a Conservation officer, historic buildings and a Surveyor, mining with first shipment. The patient will receive a drug information handout for each medication shipped and additional FDA Medication Guides as required.       DISEASE/MEDICATION-SPECIFIC INFORMATION        For patients on injectable medications: Next injection is scheduled for 08/27/24 .    SPECIALTY MEDICATION ADHERENCE     Medication Adherence    Patient reported X missed doses in the last month: 0  Specialty Medication: COSENTYX  UNOREADY PEN 300 mg/2 mL Pnij (secukinumab )  Patient is on additional specialty medications: No  Patient is on more than two specialty medications: No  Any gaps in refill history greater than 2 weeks in the last 3 months: no  Demonstrates understanding of importance of adherence: yes              Were doses missed due to medication being on hold? No      secukinumab : COSENTYX  UNOREADY PEN 300 mg/2 mL Pnij- 0  doses of medicine on hand             REFERRAL TO PHARMACIST     Referral to the pharmacist: Not needed      SHIPPING     Shipping address confirmed in Epic.     Cost and Payment: Patient has a copay of $4.00 . They are aware and have authorized the pharmacy to charge the credit card on file.    Delivery Scheduled: Yes, Expected medication delivery date: 08/23/24 .     Medication will be delivered via UPS to the prescription address in Epic [facility].    Tawni Daring   [facility]  Specialty Technician

## 2024-08-22 MED FILL — COSENTYX UNOREADY PEN 300 MG/2 ML SUBCUTANEOUS PEN INJECTOR: SUBCUTANEOUS | 28 days supply | Qty: 4 | Fill #1

## 2024-09-09 DIAGNOSIS — L732 Hidradenitis suppurativa: Principal | ICD-10-CM

## 2024-09-09 DIAGNOSIS — Z79899 Other long term (current) drug therapy: Principal | ICD-10-CM

## 2024-09-09 MED ORDER — SECUKINUMAB 300 MG/2 ML SUBCUTANEOUS PEN INJECTOR
SUBCUTANEOUS | 11 refills | 0.00000 days | Status: CP
Start: 2024-09-09 — End: ?
  Filled 2024-09-16: qty 8, 28d supply, fill #0

## 2024-09-09 MED ORDER — SPIRONOLACTONE 25 MG TABLET
ORAL_TABLET | Freq: Every day | ORAL | 3 refills | 90.00000 days | Status: CP
Start: 2024-09-09 — End: 2025-09-09

## 2024-09-09 MED ADMIN — triamcinolone acetonide (KENALOG-40) injection 40 mg: 40 mg | @ 13:00:00 | Stop: 2024-09-09

## 2024-09-09 NOTE — Unmapped (Signed)
 Dermatology Note     Assessment and Plan:      Hidradenitis Suppurativa, Hurley stage II, Chronic: flared or not at treatment goal  - Failed adalimumab  80mg  weekly, felt unwell on bimzelx  (body aches) and now back on cosentyx   - Continue Cosentyx  and will try to increase to 300mg  every week (currently every 2 weeks) since she is flaring between doses and requiring frequent rescue with antibiotics and ILK.  She feels like it helps overall and is better than Humira , but breaking through a few days before each dose. Discussed risks of tuberculosis, other uncommon infections, theoretical risk of malignancies, and other uncommon side effects including oral thrush.  - Continue doxycycline  100 mg BID x 2 weeks PRN flares - took 10 days recently, but still flaring some. Reviewed side effects including GI upset and photosensitivity.  Advised patient to take it with food.   - Continue triamcinolone  0.1% cream BID to affected area for up to 1 week at a time as needed for flares. Appropriate use and side effects of topical steroids discussed.  - Continue clindamycin  1 % lotion; Apply BID to HS as needed until it clears.  Intralesional Kenalog  Procedure Note: After the patient was informed of risks (including atrophy and dyspigmentation), benefits and side effects of intralesional steroid injection, the patient elected to undergo injection and verbal consent was obtained. Skin was cleaned with alcohol and injected intralesionally into the sites (below). The patient tolerated the procedure well without complications and was instructed on post-procedure care.  Location(s): L and R labia  Number of sites treated: 3  Kenalog  (triamcinolone ) Concentration: 40 mg/ml   Volume: 0.4 ml total      Education was provided by discussing the etiology, natural history, course and treatment for the above conditions. Reassurance and anticipatory guidance were provided.     The patient was advised to call for an appointment should any new, changing, or symptomatic lesions develop.     RTC: 4-6 months  _________________________________________________________________      Chief Complaint     Chief Complaint   Patient presents with    Hidradenitis suppurativa     Pt coming in for HS follow up.  Currently flaring in groin area.         HPI     Teresa Bowman is a 43 y.o. female who presents as a returning patient (last seen 08/15/2024) to Dermatology for hidradenitis suppurativa followup.  Feels like Cosentyx  q2 weeks is helpful overall, but she is flaring about 3-4 days before each dose. She has a few tender areas on the labia majora today and they have not responded to 10 days of doxycycline .      Pertinent Past Medical History     Problem List    None        Past Medical History, Family History, Social History, Medication List, Allergies, and Problem List were reviewed in the rooming section of Epic.     ROS: Other than symptoms mentioned in the HPI, no fevers, chill, or other skin complaints.     Physical Examination     GENERAL: Well-appearing female in no acute distress, resting comfortably.  NEURO: Alert and oriented, answers questions appropriately  PSYCH: Normal mood and affect  RESP: No increased work of breathing  SKIN (Focal Skin Exam): Per patient request, examination of L vulva was performed    - 3 IN labia majora    All areas not commented on are within normal limits or unremarkable      (  Approved Template 08/03/2020)

## 2024-09-09 NOTE — Unmapped (Signed)
 Self-reported severity (0-5): 3  VAS pain today: 3  VAS average pain for the last month: 3  Requiring pain medication? No.  If so, what type/frequency?   How often in pain?  few times a day  Level of odor (0-5): 1  Level of itching (0-5): 2  Dressing changes needed for drainage:Once a day  How much drainage: no drainage  Flare in the last month (Y/N)? Yes.  How long ago was the last flare? In last 6 months  Developing new lesions? once a month  Number of inflammatory lesions montly: 3-5  DLQI: 10  Current treatment: cosentyx      How helpful is the current treatment in managing the following aspects of your disease?  Not at all helpful Somewhat helpful Very helpful   Pain   x   Decreasing length of flares  x    Decreasing new lesions  x    Drainage  x    Decreasing frequency of flares  x    Decreasing severity of flares   x   Odor   x

## 2024-09-09 NOTE — Unmapped (Addendum)
 Meet your team:     Your intake nurse is: Bruno     Please remember to fill out the survey you will receive after your visit. Your comments help us  continue to improve our care.      Thanks in advance!      [facility] Dermatology Clinical Staff           Hidradentis Suppurativa (pronounced ???high-drad-en-eye-tis/sup-your-uh-tee-vah???) is a chronic disease of hair follicles.  The lesions occur most commonly on areas of skin-to-skin contact: under the arms (axillary area), in the groin, around the buttocks, in the region around the anus and genitals, and on the skin between and under the breasts. In women, the underarms, groin, and breast areas are most commonly affected. Men most often have HS lesions around the anus and under the arms and may also have HS at the back of the neck and behind and around the ears.    What does HS look and feel like?   The first thing that someone with HS notices is a tender, raised, red bump that looks like an under-the-skin pimple or boil. Sometimes HS lesions have two or more ???heads.???  In mild disease only an occasional boil or abscess may occur, but in more active disease there can be many new lesions every month.  Some abscesses can become larger and may open and drain pus.  Bleeding and increased odor can also occur. In severe disease, deeper abscesses develop and may connect with each other under the skin to form tunnel-like tracts (sinuses, fistulas).  These may drain constantly, or may temporarily improve and then usually begin draining again over time.  In people who have had sinus tracts for some time, scars form that feel like ropes under the skin. In the very worst cases, networks of sinus tracts can form deeper in the body, including the muscle and other tissues. Many people with severe HS have scars that can limit their ability to freely move their arms or legs, though this is very unlikely for most patients.     Clinicians usually classify or ???grade??? HS using the [facility] staging system according to the severity of the disease for each body location:   [place] stage I: one or more abscesses are present, but no sinus tracts have formed and no scars have developed   [place] stage II: one or more abscesses are present that resolve and recur; on sinus tract can be present and scarring is seen   [place] stage III: many abscesses and more than one sinus tract is present with extensive scars.    What causes HS?  The cause of HS is not completely understood.  It seems to be a disorder of hair follicles and often many family members are affected so genetics probably play a strong role.  Bacteria are often present and may make the disease worse, but infection does not seem to be the main cause. Hormones are also likely play a role since the condition typically starts around puberty when hair follicles under the arms and in the groin start to change.  It can sometimes flare with menstrual cycles in women as well.  In most cases it lasts for decades and starts to improve to some extent in the late 30s and 40s as long as many fistulas have not already formed.  Women are three times more likely than men to develop HS.    Other factors are known to contribute to HS flaring or becoming worse, though they are  likely not the main causes. The factors most commonly associated with HS include:   Cigarette smoking - this is very highly linked.  Stopping smoking will likely not cure the disease, but likely is helpful in reducing how much and how often it flares.   Obesity - HS may occur even in people that are not overweight, but it is much more common in patients that are.  There is some evidence that losing weight and eating a diet low in sugars and fats may be helpful in improving hidradenitis, though this is not helpful for everyone.  Working with a nutritionist may be an important way to help with this and is something your physician can help coordinate    Hidradenitis is not contagious.  It is not caused by a problem with personal hygiene or any other activity or behavior of those with the disease.    How can your doctor help you treat your hidradenitis?  Clinicians use both medication and surgery to treat HS. The choice of treatment--or combination of treatments--is made according to an individual patient???s needs. Clinicians consider several factors in determining the most appropriate plan for therapy:   Severity of disease - medications and some laser treatments are usually able to control disease best when fistulas are not present.  Fistulas typically require surgery.   Extent and location of disease   Chronicity (how often the lesions recur)    A number of different surgical methods have been developed that are useful for certain patients under particular circumstances. These can be done with local numbing and healing at home for some areas when disease is not too extensive with relatively brief recovery times.  In more extensive disease there may be a need for larger excisions under general anesthesia with healing time in the hospital and prolonged recovery periods for better disease control.      In addition, many medical treatments have been tried--some with more success than others. No medication is effective for all patients, and you and your doctor may have to try several different agents or combinations of agents before you find the treatment plan that works best for you.  The goals of therapy with medications that are either topical (used on the skin) or systemic (taken by mouth) are:  1. to clear the lesions or at least reduce their number and extent, and  2. to prevent new lesions from forming.  3. To reduce pain, drainage, and odor  Some of the types of medications commonly used are antibacterial skin washes and the topical antibiotics to prevent secondary infections and corticosteroid injections into the lesions to reduce inflammation.     Other medications that may be used include retinoids (similar to Accutane), drugs that effect how hormones and hair follicles interact, drugs that affect your immune system (such as methotrexate, adalimumab /Humira , and Remicaid/infliximab), steroids, and oral antibiotics.    Lasers that destroy hair follicles can also be helpful since they reduce the hair follicles that cause the problems.  Multiple treatments are typically required over time and there is some discomfort associated with treatment, but it is typically very fast and well-tolerated.    It is very important to realize that hidradenitis cannot be completely cured with any single medication or surgical procedure.  It is a disease that can be very stubborn and difficult to control, but with good treatment a lot of improvement and sometimes temporary remissions can be obtained. Poorly controlled disease can cause more fistulas to form and make managing  the disease much more difficult over time so it is important to seek care to reduce major flares.  Surgery can provide a long term cure in some areas, though the disease can start again or continue in nearby areas.  A dermatologist is often the best person to help coordinate disease treatment, and sometimes other surgeons, pain specialists, other specialists, and nutritionists may be part of the treatment team.    What can you do to help your HS?  1. If your are a smoker, then stopping can probably be helpful.  Your dermatologist will be happy to refer you to some one who can help with this.  2. Follow a healthy diet and try to achieve a healthy weight  Some other self-help measures are:   Keep your skin cool and dry (becoming overheated and sweating can contribute to an HS flare)   To reduce the pain of cysts or nodules or to help them to drain, apply hot compresses or soak in hot water for 10 minutes at a time (use a clean washcloth or a teabag soaked in hot water)   For female patients, cotton underwear that does not have tight elastic in the groin can be helpful. Boyshort, brief, or boxer style underwear may be a better option as friction on hair follicles in affected areas can be a major trigger in some patients.  These can be easily found on [place] or with some retailers.  Fruit of the Loom and Underworks are two brands that are sometimes recommended.    Finally, know that you are not alone. Coping with the pain and other symptoms of HS can be very difficult, so it may be helpful to connect with others who live with HS. Patient groups and networks can be sources of important information and support. Some internet resources for information and connections are provided below.  Resources for Information    The Hidradenitis Suppurativa Foundation: A nonprofit organized by a group of physicians interested in treating and advancing research in hidradenitis suppurativa    American Academy of Dermatology  ARanked.fi    [employer]  ElevatorPitchers.de.html  [employer]ORD: [employer]  https://www.rarediseases.org/rare-disease-information/rare-diseases/byID/358/viewAbstract  Trials of new medications for HS  https://www.clinicaltrials.gov

## 2024-09-11 NOTE — Unmapped (Addendum)
 [facility] Pharmacist has reviewed a new prescription for Cosentyx  that indicates a dose increase.  Patient was counseled on this dosage change by CS- see epic note from 10/20.  Next refill call date adjusted if necessary.    Meghan A. Claudene, PharmD, BCPS - Clinical Pharmacist   [facility]    [address] [place], [place]  [address]  t [phone], opt 4 then 2 - f [phone]                   Clinical Assessment Needed For: Dose Change  Medication: Cosentyx  Unoready Pen 300mg /74mL injection  Last Fill Date/Day Supply: 08/22/2024 / 28 days  Copay $4  Was previous dose already scheduled to fill: No    Notes to Pharmacist: N/A

## 2024-09-11 NOTE — Unmapped (Unsigned)
 [facility] Refill Coordination Note    Specialty Medication(s) to be Shipped:   {specpharm:59087}    Other medication(s) to be shipped: {Blank:19197::***,No additional medications requested for fill at this time}    Specialty Medications not needed at this time: {specpharm2:122626}     Loan Oguin, DOB: 06-Nov-1981  Phone: There are no phone numbers on file.      All above HIPAA information was verified with {Blank:19197::patient.,patient's caregiver, ***,patient's family member, ***.}     Was a Nurse, learning disability used for this call? {Blank single:19197::Yes, ***. Patient language is appropriate in WAM,No}    Completed refill call assessment today to schedule patient's medication shipment from the [facility]  [phone]).  All relevant notes have been reviewed.     Specialty medication(s) and dose(s) confirmed: {Blank:19197::Regimen is correct and unchanged.,Patient reports changes to the regimen as follows: ***}   Changes to medications: {Blank:19197::Latrell reports starting the following medications: ***,Shantinique reports stopping the following medications: ***,Sabrinia reports no changes at this time.}  Changes to insurance: {Blank:19197::Yes: ***,No}  New side effects reported not previously addressed with a pharmacist or physician: {sscrefillsideeffects:78475}  Questions for the pharmacist: {Blank:19197::Yes: ***,No}    Confirmed patient received a Conservation officer, historic buildings and a Surveyor, mining with first shipment. The patient will receive a drug information handout for each medication shipped and additional FDA Medication Guides as required.       DISEASE/MEDICATION-SPECIFIC INFORMATION        {clinicspecificinstructions:59274}    SPECIALTY MEDICATION ADHERENCE              Were doses missed due to medication being on hold? {Blank:19197::No,Yes - ***}    *** *** {Blank:19197::mg,mg/ml,***}: *** {Blank:19197::days,doses} of medicine on hand   *** *** {Blank:19197::mg,mg/ml,***}: *** {Blank:19197::days,doses} of medicine on hand   *** *** {Blank:19197::mg,mg/ml,***}: *** {Blank:19197::days,doses} of medicine on hand   *** *** {Blank:19197::mg,mg/ml,***}: *** {Blank:19197::days,doses} of medicine on hand   *** *** {Blank:19197::mg,mg/ml,***}: *** {Blank:19197::days,doses} of medicine on hand     REFERRAL TO PHARMACIST     Referral to the pharmacist: {SSCRefertoRPH:77899}      SHIPPING     Shipping address confirmed in Epic.     Cost and Payment: {Blank single:19197::Patient has a $0 copay, payment information is not required.,Patient has a copay of $***. They are aware and have authorized the pharmacy to charge the credit card on file.,Unable to determine copay at this time as the prescription requires a prior authorization/financial assistance. Patient is aware that shipment will be held until copay has been approved and payment information collected, if needed.}    Delivery Scheduled: {Blank:19197::Yes, Expected medication delivery date: ***.,Yes, Expected medication delivery date: ***.  However, Rx request for refills was sent to the provider as there are none remaining.,Patient declined refill at this time due to ***.,No, cannot schedule delivery at this time as there are outstanding items that need addressed.  This note has been handed off to the provider for follow up.,Due to patient insurance changes, unable to fill at [facility], please route Rx to *** specialty pharmacy}     Medication will be delivered via {Blank:19197::UPS,Next Day Courier,Same Day Courier,***} to the {Blank:19197::prescription,temporary} address in Epic WAM.    Dena Fees   [facility]  Specialty {Blank:19197::Pharmacist,Technician}

## 2024-09-12 NOTE — Unmapped (Signed)
 [facility] Pharm[facility] Refill Coordination Note    Specialty Medication(s) to be Shipped:   Inflammatory Disorders: Cosentyx     Other medication(s) to be shipped: No additional medications requested for fill at this time    Specialty Medications not needed at this time: N/A     Teresa Bowman, DOB: 05-Apr-1981  Phone: There are no phone numbers on file.      All above HIPAA information was verified with patient.     Was a Nurse, learning disability used for this call? No    Completed refill call assessment today to schedule patient's medication shipment from the [facility]  [phone]).  All relevant notes have been reviewed.     Specialty medication(s) and dose(s) confirmed: pt to start new frequency of weekly    Changes to medications: Lavon reports no changes at this time.  Changes to insurance: No  New side effects reported not previously addressed with a pharmacist or physician: None reported  Questions for the pharmacist: No    Confirmed patient received a Conservation officer, historic buildings and a Surveyor, mining with first shipment. The patient will receive a drug information handout for each medication shipped and additional FDA Medication Guides as required.       DISEASE/MEDICATION-SPECIFIC INFORMATION        For patients on injectable medications: Next injection is scheduled for 10.28.25.    SPECIALTY MEDICATION ADHERENCE     Medication Adherence    Patient reported X missed doses in the last month: 0  Specialty Medication: secukinumab  300 mg/2 mL Pnij  Patient is on additional specialty medications: No              Were doses missed due to medication being on hold? No      secukinumab  300 mg/2 mL Pnij: 0 doses of medicine on hand       REFERRAL TO PHARMACIST     Referral to the pharmacist: Not needed      [facility]     Shipping address confirmed in Epic.     Cost and Payment: Patient has a copay of $4. They are aware and have authorized the pharmacy to charge the credit card on file.    Delivery Scheduled: Yes, Expected medication delivery date: 10.28.25.     Medication will be delivered via UPS to the prescription address in Epic WAM.    Doyal Hurst   [facility]  Specialty Technician

## 2024-10-08 DIAGNOSIS — L732 Hidradenitis suppurativa: Principal | ICD-10-CM

## 2024-10-09 NOTE — Progress Notes (Signed)
 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 Lesions are typical of previous flares. Currently on Cosentyx  with periodic flares requiring Kenalog  40 injections. No current doxycycline  use, but considering initiation due to flare.  - Administered Kenalog  40 injection to each nodule. 0.2 ml total after verbal consent obtained and timeout performed  - Initiate doxycycline  100 mg twice daily for one week.  - Apply clindamycin  topically to affected areas.  - Maintain follow-up with Dr. Paulita in January.    History of Present Illness  Teresa Bowman is a 43 year old female with hidradenitis suppurativa who presents with two new flares in the groin area. She is a patient of Doctor Paulita.    Groin lesions  - Two new flares present for approximately one week, located on the right and left labia  - Lesions are similar in appearance and symptoms to previous flares    Hidradenitis suppurativa management  - Currently receiving Cosentyx  for hidradenitis suppurativa  - Utilizes topical treatments, including steroid cream, for flare management  - Previously responded well to Kenalog  40 injections during flares  - Not currently taking doxycycline , but has medication available at home    Physical Exam  GENITOURINARY: Nodule on right and left labia consistent with inflammatory nodule of hidradenitis suppurativa.

## 2024-10-15 MED ORDER — CLINDAMYCIN 1 % LOTION
11 refills | 0.00000 days | Status: CP
Start: 2024-10-15 — End: ?

## 2024-10-15 MED FILL — COSENTYX UNOREADY PEN 300 MG/2 ML SUBCUTANEOUS PEN INJECTOR: SUBCUTANEOUS | 28 days supply | Qty: 8 | Fill #1

## 2024-10-15 NOTE — Progress Notes (Signed)
 [facility] Delivery Pharm[facility] Refill Coordination Note    Specialty Medication(s) to be Shipped:   Inflammatory Disorders: Cosentyx     Other medication(s) to be shipped: No additional medications requested for fill at this time    Specialty Medications not needed at this time: N/A     Teresa Bowman, DOB: 09/16/1981  Phone: There are no phone numbers on file.      All above HIPAA information was verified with patient.     Was a nurse, learning disability used for this call? No    Completed refill call assessment today to schedule patient's medication shipment from the [facility]  [phone]).  All relevant notes have been reviewed.     Specialty medication(s) and dose(s) confirmed: Regimen is correct and unchanged.   Changes to medications: Teresa Bowman reports no changes at this time.  Changes to insurance: No  New side effects reported not previously addressed with a pharmacist or physician: None reported  Questions for the pharmacist: No    Confirmed patient received a Conservation Officer, Historic Buildings and a Surveyor, Mining with first shipment. The patient will receive a drug information handout for each medication shipped and additional FDA Medication Guides as required.       DISEASE/MEDICATION-SPECIFIC INFORMATION        For patients on injectable medications: Next injection is scheduled for 11/26.    SPECIALTY MEDICATION ADHERENCE     Medication Adherence    Patient reported X missed doses in the last month: 0  Specialty Medication: COSENTYX  UNOREADY PEN 300 mg/2 mL Pnij (secukinumab )  Patient is on additional specialty medications: No  Patient is on more than two specialty medications: No  Any gaps in refill history greater than 2 weeks in the last 3 months: no  Demonstrates understanding of importance of adherence: yes  Informant: patient  Reliability of informant: reliable  Provider-estimated medication adherence level: good  Patient is at risk for Non-Adherence: No  Reasons for non-adherence: no problems identified  Confirmed plan for next specialty medication refill: delivery by pharmacy  Refills needed for supportive medications: not needed          Refill Coordination    Has the Patients' Contact Information Changed: No  Is the Shipping Address Different: No         Were doses missed due to medication being on hold? No    Cosentyx  300/2 mg/ml: 0 doses of medicine on hand       REFERRAL TO PHARMACIST     Referral to the pharmacist: Not needed      [facility]     Shipping address confirmed in Epic.     Cost and Payment: Patient has a $0 copay, payment information is not required.    Delivery Scheduled: Yes, Expected medication delivery date: 11/26.     Medication will be delivered via UPS to the prescription address in Epic WAM.    [facility]  Specialty Technician

## 2024-10-15 NOTE — Progress Notes (Signed)
 Sent to pharmacy

## 2024-11-11 NOTE — Progress Notes (Signed)
 [facility] Delivery Pharm[facility] Refill Coordination Note    Specialty Medication(s) to be Shipped:   Inflammatory Disorders: Cosentyx     Other medication(s) to be shipped: No additional medications requested for fill at this time    Specialty Medications not needed at this time: N/A     Teresa Bowman, DOB: [birth year]  Phone: There are no phone numbers on file.      All above HIPAA information was verified with patient.     Was a nurse, learning disability used for this call? No    Completed refill call assessment today to schedule patient's medication shipment from the [facility]  [phone]).  All relevant notes have been reviewed.     Specialty medication(s) and dose(s) confirmed: Regimen is correct and unchanged.   Changes to medications: Teresa Bowman reports no changes at this time.  Changes to insurance: No  New side effects reported not previously addressed with a pharmacist or physician: None reported  Questions for the pharmacist: No    Confirmed patient received a Conservation Officer, Historic Buildings and a Surveyor, Mining with first shipment. The patient will receive a drug information handout for each medication shipped and additional FDA Medication Guides as required.       DISEASE/MEDICATION-SPECIFIC INFORMATION        N/A    SPECIALTY MEDICATION ADHERENCE     Medication Adherence    Specialty Medication: COSENTYX  UNOREADY PEN 300 mg/2 mL Pnij (secukinumab )  Patient is on additional specialty medications: No              Were doses missed due to medication being on hold? No     COSENTYX  UNOREADY PEN 300 mg/2 mL Pnij (secukinumab ): 0 days of medicine on hand       Specialty medication is an injection or given on a cycle: Yes, Next injection is scheduled for 11/12/24.    REFERRAL TO PHARMACIST     Referral to the pharmacist: Not needed      [facility]     Shipping address confirmed in Epic.     Cost and Payment: Patient has a copay of $4. They are aware and have authorized the pharmacy to charge the credit card on file.    Delivery Scheduled: Yes, Expected medication delivery date: 11/13/24.     Medication will be delivered via UPS to the prescription address in Epic WAM.    Teresa Bowman   [facility] Pharm[facility]  Specialty Technician

## 2024-11-12 MED FILL — COSENTYX UNOREADY PEN 300 MG/2 ML SUBCUTANEOUS PEN INJECTOR: SUBCUTANEOUS | 28 days supply | Qty: 8 | Fill #2

## 2024-11-20 DIAGNOSIS — L732 Hidradenitis suppurativa: Principal | ICD-10-CM

## 2024-11-20 MED ORDER — DOXYCYCLINE HYCLATE 100 MG CAPSULE
ORAL_CAPSULE | Freq: Two times a day (BID) | ORAL | 1 refills | 45.00000 days | Status: CP
Start: 2024-11-20 — End: ?

## 2024-11-26 DIAGNOSIS — B3731 Vagina, candidiasis: Principal | ICD-10-CM

## 2024-11-26 DIAGNOSIS — Z79899 Other long term (current) drug therapy: Principal | ICD-10-CM

## 2024-11-26 DIAGNOSIS — L732 Hidradenitis suppurativa: Principal | ICD-10-CM

## 2024-11-26 MED ORDER — DOXYCYCLINE HYCLATE 100 MG CAPSULE
ORAL_CAPSULE | Freq: Two times a day (BID) | ORAL | 1 refills | 45.00000 days | Status: CP
Start: 2024-11-26 — End: ?

## 2024-11-26 MED ORDER — FLUCONAZOLE 150 MG TABLET
ORAL_TABLET | ORAL | 1 refills | 0.00000 days | Status: CP
Start: 2024-11-26 — End: ?

## 2024-11-26 MED ADMIN — triamcinolone acetonide (KENALOG-40) injection 40 mg: 40 mg | @ 21:00:00 | Stop: 2024-11-26

## 2024-11-26 NOTE — Progress Notes (Signed)
 I saw and evaluated the patient, participating in the key elements of the service.  I discussed the findings, assessment and plan with the resident and agree with resident???s findings and plan as documented in the resident's note.  I was immediately available for the entirety of the procedure(s) and present for the key and critical portions. Leonette Nutting, MD

## 2024-11-26 NOTE — Progress Notes (Signed)
 Dermatology Note     Assessment and Plan:      Hidradenitis Suppurativa, Hurley stage II, Chronic: flared or not at treatment goal  - Failed adalimumab  80mg  weekly, felt unwell on bimzelx  (body aches) and now back on cosentyx   - Continue Cosentyx  every 2 weeks since she is flaring between doses and requiring frequent rescue with antibiotics and ILK.  She feels like it helps overall and is better than Humira , but breaking through a few days before each dose. Discussed risks of tuberculosis, other uncommon infections, theoretical risk of malignancies, and other uncommon side effects including oral thrush.  - Discussed alternative treatment options, including laser hair reduction and surgery. Patient interested in laser therapy. Will message schedulers.  - Continue doxycycline  100 mg BID x 2 weeks PRN flares - took 10 days recently, but still flaring some. Reviewed side effects including GI upset and photosensitivity.  Advised patient to take it with food.   - Continue fluconazole  150mg  weekly prn for yeast infections while on antibiotics.  - Continue triamcinolone  0.1% cream BID to affected area for up to 1 week at a time as needed for flares. Appropriate use and side effects of topical steroids discussed.  - Continue clindamycin  1 % lotion; Apply BID to HS as needed until it clears.    High Risk Medication Use (cosentyx )  - quant gold normal 08/2024    Intralesional Kenalog  Procedure Note: After the patient was informed of risks (including atrophy and dyspigmentation), benefits and side effects of intralesional steroid injection, the patient elected to undergo injection and verbal consent was obtained. Skin was cleaned with alcohol and injected intralesionally into the sites (below). The patient tolerated the procedure well without complications and was instructed on post-procedure care.  Location(s): L labia  Number of sites treated: 2  Kenalog  (triamcinolone ) Concentration: 40 mg/ml   Volume: 0.1 ml total    The patient was advised to call for an appointment should any new, changing, or symptomatic lesions develop.     RTC: next scheduled laser appointment or sooner as needed   _________________________________________________________________      Chief Complaint     Chief Complaint   Patient presents with    Follow-up     Hidradenitis suppurativa- flare up in groin area       HPI     Teresa Bowman. Teresa Bowman is a 44 y.o. female who presents as a returning patient (last seen 10/08/2024) to Dermatology for follow up of HS. At [facility], patient was seen by Dr. Roe for acute HS flare for which she was treated with intra-lesional kenalog  and started on doxycycline  100mg  BID for 1 week.    Today, reports:  - She still is having frequent flares requiring repeat intra-lesional kenalog  injections.  - she wonders whether she would benefit from treatment with eczema medication. She does not have history of eczema.  - she feels like her cosentyx  is not as efficacious as when she was first started on it and desires to discuss alternative treatments. She would also like to be considered for laser hair reduction.    The patient denies any other new or changing lesions or areas of concern.     Pertinent Past Medical History     Past Medical History, Family History, Social History, Medication List, Allergies, and Problem List were reviewed in the rooming section of Epic.     ROS: Other than symptoms mentioned in the HPI, no fevers, chills, or other skin complaints    Physical  Examination     GENERAL: Well-appearing female in no acute distress, resting comfortably.  NEURO: Alert and oriented, answers questions appropriately  PSYCH: Normal mood and affect  RESP: No increased work of breathing  SKIN (Focal Skin Exam): Per patient request, examination of groin was performed  - 2 inflammatory papules on the left labia    All areas not commented on are within normal limits or unremarkable      (Approved Template 12/23/2023)

## 2024-11-26 NOTE — Patient Instructions (Signed)
 [facility] releases most results to you as soon as they are available. Therefore, you may see some results before we do. Please give [place] 3 business days to review the tests and contact you by phone or through MyChart. If you are concerned that some results may be upsetting or confusing, you may wish to wait until we contact you before looking at the report in MyChart.   If you have an urgent question, you can call our clinic. MyChart should not be used for urgent issues. Otherwise, we prefer that you wait 3 business days for [place] to contact you.    [facility] Dermatology Clinical Team

## 2024-11-28 NOTE — Telephone Encounter (Addendum)
 MAIL BOX FULL could not leave message; sent My Chart----- Message from Mercer Benjamin, MD sent at 11/26/2024  4:35 PM EST -----  Regarding: Please schedule for laser treatment with Dr. Paulita Erskin Channel,    Can we please reach out to this patient for scheduling laser hair reduction for HS. She desires to have treatment with Dr. Paulita specifically.     Thanks,  Vivian

## 2024-12-02 NOTE — Telephone Encounter (Addendum)
 2nd attempt MAIL BOX FULL RECALL SENT----- Message from Mercer Benjamin, MD sent at 11/26/2024  4:35 PM EST -----  Regarding: Please schedule for laser treatment with Dr. Paulita Erskin Channel,    Can we please reach out to this patient for scheduling laser hair reduction for HS. She desires to have treatment with Dr. Paulita specifically.     Thanks,  Vivian

## 2024-12-05 NOTE — Progress Notes (Signed)
 [facility] Refill Coordination Note    Specialty Medication(s) to be Shipped:   Inflammatory Disorders: Cosentyx     Other medication(s) to be shipped: No additional medications requested for fill at this time    Specialty Medications not needed at this time: N/A     Teresa Bowman, DOB: September 20, 1981  Phone: There are no phone numbers on file.      All above HIPAA information was verified with patient.     Was a nurse, learning disability used for this call? No    Completed refill call assessment today to schedule patient's medication shipment from the [facility]  [phone]).  All relevant notes have been reviewed.     Specialty medication(s) and dose(s) confirmed: Regimen is correct and unchanged.   Changes to medications: Teresa Bowman reports no changes at this time.  Changes to insurance: No  New side effects reported not previously addressed with a pharmacist or physician: None reported  Questions for the pharmacist: No    Confirmed patient received a Conservation Officer, Historic Buildings and a Surveyor, Mining with first shipment. The patient will receive a drug information handout for each medication shipped and additional FDA Medication Guides as required.       DISEASE/MEDICATION-SPECIFIC INFORMATION        N/A    SPECIALTY MEDICATION ADHERENCE     Medication Adherence    Patient reported X missed doses in the last month: 0  Specialty Medication: COSENTYX  UNOREADY PEN 300 mg/2 mL Pnij (secukinumab )  Patient is on additional specialty medications: No              Were doses missed due to medication being on hold? No    secukinumab : COSENTYX  UNOREADY PEN 300 mg/2 mL Pnij 1 doses of medicine on hand       Specialty medication is an injection or given on a cycle: Yes, Next injection is scheduled for 12/10/2024.    REFERRAL TO PHARMACIST     Referral to the pharmacist: Not needed      [facility]     Shipping address confirmed in Epic.     Cost and Payment: Patient has a copay of $4.00. They are aware and have authorized the pharmacy to charge the credit card on file.    Delivery Scheduled: Yes, Expected medication delivery date: 12/11/2024.     Medication will be delivered via UPS to the prescription address in Epic WAM.    [facility]  Specialty Technician

## 2024-12-10 MED FILL — COSENTYX UNOREADY PEN 300 MG/2 ML SUBCUTANEOUS PEN INJECTOR: SUBCUTANEOUS | 28 days supply | Qty: 8 | Fill #3
